# Patient Record
Sex: Female | Born: 1985 | Race: Black or African American | Hispanic: No | Marital: Single | Smoking: Former smoker
Health system: Southern US, Community
[De-identification: ages and names within clinical notes are randomized; demographics above are authoritative.]

## PROBLEM LIST (undated history)

## (undated) ENCOUNTER — Inpatient Hospital Stay: Payer: Self-pay

## (undated) DIAGNOSIS — D649 Anemia, unspecified: Secondary | ICD-10-CM

## (undated) DIAGNOSIS — O99210 Obesity complicating pregnancy, unspecified trimester: Secondary | ICD-10-CM

## (undated) HISTORY — PX: OVARIAN CYST REMOVAL: SHX89

## (undated) HISTORY — PX: CHOLECYSTECTOMY: SHX55

---

## 2007-04-03 ENCOUNTER — Emergency Department: Payer: Self-pay | Admitting: Emergency Medicine

## 2010-06-29 ENCOUNTER — Emergency Department: Payer: Self-pay | Admitting: Unknown Physician Specialty

## 2010-08-04 HISTORY — PX: OVARIAN CYST REMOVAL: SHX89

## 2011-08-01 ENCOUNTER — Emergency Department: Payer: Self-pay | Admitting: Emergency Medicine

## 2011-10-26 ENCOUNTER — Emergency Department: Payer: Self-pay | Admitting: Emergency Medicine

## 2011-10-26 LAB — URINALYSIS, COMPLETE
Bilirubin,UR: NEGATIVE
Glucose,UR: NEGATIVE mg/dL (ref 0–75)
Ketone: NEGATIVE
Squamous Epithelial: 1
WBC UR: 2 /HPF (ref 0–5)

## 2011-10-26 LAB — COMPREHENSIVE METABOLIC PANEL
Albumin: 4.1 g/dL (ref 3.4–5.0)
BUN: 12 mg/dL (ref 7–18)
Bilirubin,Total: 0.4 mg/dL (ref 0.2–1.0)
EGFR (African American): >60
Osmolality: 280 (ref 275–301)
Potassium: 4.2 mmol/L (ref 3.5–5.1)
Sodium: 141 mmol/L (ref 136–145)
Total Protein: 7.8 g/dL (ref 6.4–8.2)

## 2011-10-26 LAB — CBC
HCT: 40.6 % (ref 35.0–47.0)
MCHC: 33.1 g/dL (ref 32.0–36.0)
Platelet: 271 10*3/uL (ref 150–440)

## 2011-10-26 LAB — PREGNANCY, URINE: Pregnancy Test, Urine: NEGATIVE m[IU]/mL

## 2011-10-26 LAB — WET PREP, GENITAL

## 2012-07-18 ENCOUNTER — Emergency Department: Payer: Self-pay | Admitting: Internal Medicine

## 2012-07-18 LAB — URINALYSIS, COMPLETE
Bacteria: NONE SEEN
Bilirubin,UR: NEGATIVE
Blood: NEGATIVE
Glucose,UR: NEGATIVE mg/dL (ref 0–75)
Leukocyte Esterase: NEGATIVE
Nitrite: NEGATIVE
Protein: NEGATIVE
RBC,UR: NONE SEEN /HPF (ref 0–5)
Squamous Epithelial: 1
WBC UR: 1 /HPF (ref 0–5)

## 2012-07-18 LAB — COMPREHENSIVE METABOLIC PANEL
Alkaline Phosphatase: 87 U/L (ref 50–136)
Chloride: 107 mmol/L (ref 98–107)
Co2: 25 mmol/L (ref 21–32)
Creatinine: 0.97 mg/dL (ref 0.60–1.30)
EGFR (African American): >60
EGFR (Non-African Amer.): >60
Glucose: 86 mg/dL (ref 65–99)
Osmolality: 278 (ref 275–301)
SGPT (ALT): 22 U/L (ref 12–78)
Sodium: 139 mmol/L (ref 136–145)

## 2012-07-18 LAB — CBC
HGB: 12.7 g/dL (ref 12.0–16.0)
MCH: 28.4 pg (ref 26.0–34.0)
MCV: 84 fL (ref 80–100)
Platelet: 266 10*3/uL (ref 150–440)
RBC: 4.47 10*6/uL (ref 3.80–5.20)
RDW: 13.1 % (ref 11.5–14.5)

## 2012-07-18 LAB — WET PREP, GENITAL

## 2012-07-18 LAB — PREGNANCY, URINE: Pregnancy Test, Urine: NEGATIVE m[IU]/mL

## 2012-07-18 LAB — LIPASE, BLOOD: Lipase: 219 U/L (ref 73–393)

## 2013-03-15 ENCOUNTER — Emergency Department: Payer: Self-pay | Admitting: Emergency Medicine

## 2013-03-15 LAB — CBC
HCT: 40.8 % (ref 35.0–47.0)
HGB: 13.5 g/dL (ref 12.0–16.0)
MCH: 27.6 pg (ref 26.0–34.0)
MCHC: 33.2 g/dL (ref 32.0–36.0)
RBC: 4.91 10*6/uL (ref 3.80–5.20)

## 2013-03-15 LAB — URINALYSIS, COMPLETE
Glucose,UR: NEGATIVE mg/dL (ref 0–75)
Ketone: NEGATIVE
Ph: 6 (ref 4.5–8.0)
RBC,UR: 48 /HPF (ref 0–5)
Specific Gravity: 1.014 (ref 1.003–1.030)
Squamous Epithelial: 19
WBC UR: 18 /HPF (ref 0–5)

## 2013-03-15 LAB — COMPREHENSIVE METABOLIC PANEL
Anion Gap: 7 (ref 7–16)
Bilirubin,Total: 0.3 mg/dL (ref 0.2–1.0)
Calcium, Total: 9.2 mg/dL (ref 8.5–10.1)
Chloride: 110 mmol/L — ABNORMAL HIGH (ref 98–107)
Creatinine: 0.98 mg/dL (ref 0.60–1.30)
EGFR (African American): >60
Glucose: 92 mg/dL (ref 65–99)
Osmolality: 279 (ref 275–301)
Potassium: 4.1 mmol/L (ref 3.5–5.1)
SGOT(AST): 22 U/L (ref 15–37)
Sodium: 140 mmol/L (ref 136–145)

## 2013-03-15 LAB — GC/CHLAMYDIA PROBE AMP

## 2013-03-23 ENCOUNTER — Emergency Department: Payer: Self-pay | Admitting: Emergency Medicine

## 2013-10-05 ENCOUNTER — Emergency Department: Payer: Self-pay | Admitting: Emergency Medicine

## 2013-10-05 LAB — WET PREP, GENITAL

## 2013-10-05 LAB — URINALYSIS, COMPLETE
BLOOD: NEGATIVE
Bilirubin,UR: NEGATIVE
Glucose,UR: NEGATIVE mg/dL (ref 0–75)
Ketone: NEGATIVE
Nitrite: NEGATIVE
Ph: 6 (ref 4.5–8.0)
RBC,UR: 3 /HPF (ref 0–5)
SPECIFIC GRAVITY: 1.032 (ref 1.003–1.030)
Squamous Epithelial: 4
WBC UR: 1 /HPF (ref 0–5)

## 2013-10-05 LAB — GC/CHLAMYDIA PROBE AMP

## 2013-10-06 ENCOUNTER — Emergency Department: Payer: Self-pay | Admitting: Emergency Medicine

## 2013-10-06 LAB — COMPREHENSIVE METABOLIC PANEL
ALT: 25 U/L (ref 12–78)
Albumin: 4.1 g/dL (ref 3.4–5.0)
Alkaline Phosphatase: 78 U/L
Anion Gap: 4 — ABNORMAL LOW (ref 7–16)
BUN: 11 mg/dL (ref 7–18)
Bilirubin,Total: 0.2 mg/dL (ref 0.2–1.0)
CALCIUM: 8.7 mg/dL (ref 8.5–10.1)
CREATININE: 1.09 mg/dL (ref 0.60–1.30)
Chloride: 107 mmol/L (ref 98–107)
Co2: 27 mmol/L (ref 21–32)
EGFR (African American): >60
Glucose: 88 mg/dL (ref 65–99)
OSMOLALITY: 274 (ref 275–301)
POTASSIUM: 3.8 mmol/L (ref 3.5–5.1)
SGOT(AST): 27 U/L (ref 15–37)
SODIUM: 138 mmol/L (ref 136–145)
TOTAL PROTEIN: 8 g/dL (ref 6.4–8.2)

## 2013-10-06 LAB — CBC WITH DIFFERENTIAL/PLATELET
BASOS PCT: 0.8 %
Basophil #: 0 10*3/uL (ref 0.0–0.1)
EOS ABS: 0 10*3/uL (ref 0.0–0.7)
EOS PCT: 0 %
HCT: 42.2 % (ref 35.0–47.0)
HGB: 13.8 g/dL (ref 12.0–16.0)
LYMPHS ABS: 1.1 10*3/uL (ref 1.0–3.6)
Lymphocyte %: 23.8 %
MCH: 27.5 pg (ref 26.0–34.0)
MCHC: 32.8 g/dL (ref 32.0–36.0)
MCV: 84 fL (ref 80–100)
Monocyte #: 0.7 x10 3/mm (ref 0.2–0.9)
Monocyte %: 15.2 %
NEUTROS ABS: 2.9 10*3/uL (ref 1.4–6.5)
Neutrophil %: 60.2 %
Platelet: 224 10*3/uL (ref 150–440)
RBC: 5.03 10*6/uL (ref 3.80–5.20)
RDW: 13.4 % (ref 11.5–14.5)
WBC: 4.7 10*3/uL (ref 3.6–11.0)

## 2013-10-06 LAB — LIPASE, BLOOD: Lipase: 115 U/L (ref 73–393)

## 2014-01-01 ENCOUNTER — Emergency Department: Payer: Self-pay | Admitting: Emergency Medicine

## 2014-01-01 LAB — URINALYSIS, COMPLETE
Bacteria: NONE SEEN
Bilirubin,UR: NEGATIVE
Glucose,UR: NEGATIVE mg/dL (ref 0–75)
NITRITE: NEGATIVE
Ph: 7 (ref 4.5–8.0)
Protein: NEGATIVE
SPECIFIC GRAVITY: 1.014 (ref 1.003–1.030)
WBC UR: 13 /HPF (ref 0–5)

## 2014-01-01 LAB — D-DIMER(ARMC): D-Dimer: 285 ng/ml

## 2014-01-01 LAB — CBC
HCT: 40.3 % (ref 35.0–47.0)
HGB: 13.1 g/dL (ref 12.0–16.0)
MCH: 27.4 pg (ref 26.0–34.0)
MCHC: 32.6 g/dL (ref 32.0–36.0)
MCV: 84 fL (ref 80–100)
PLATELETS: 271 10*3/uL (ref 150–440)
RBC: 4.8 10*6/uL (ref 3.80–5.20)
RDW: 13.3 % (ref 11.5–14.5)
WBC: 10.5 10*3/uL (ref 3.6–11.0)

## 2014-01-01 LAB — BASIC METABOLIC PANEL
Anion Gap: 5 — ABNORMAL LOW (ref 7–16)
BUN: 11 mg/dL (ref 7–18)
CALCIUM: 9 mg/dL (ref 8.5–10.1)
CHLORIDE: 108 mmol/L — AB (ref 98–107)
Co2: 24 mmol/L (ref 21–32)
Creatinine: 1.12 mg/dL (ref 0.60–1.30)
GLUCOSE: 88 mg/dL (ref 65–99)
OSMOLALITY: 273 (ref 275–301)
Potassium: 3.9 mmol/L (ref 3.5–5.1)
Sodium: 137 mmol/L (ref 136–145)

## 2014-01-01 LAB — PREGNANCY, URINE: PREGNANCY TEST, URINE: NEGATIVE m[IU]/mL

## 2014-01-01 LAB — TROPONIN I

## 2014-01-10 ENCOUNTER — Emergency Department: Payer: Self-pay | Admitting: Emergency Medicine

## 2014-08-04 DIAGNOSIS — K829 Disease of gallbladder, unspecified: Secondary | ICD-10-CM

## 2014-08-04 HISTORY — DX: Disease of gallbladder, unspecified: K82.9

## 2014-10-22 ENCOUNTER — Emergency Department: Payer: Self-pay | Admitting: Internal Medicine

## 2014-11-07 ENCOUNTER — Emergency Department
Admit: 2014-11-07 | Disposition: A | Discharge status: Home / Self Care | Payer: Self-pay | Admitting: Emergency Medicine

## 2014-11-07 LAB — COMPREHENSIVE METABOLIC PANEL
ALBUMIN: 4.2 g/dL
AST: 22 U/L
Alkaline Phosphatase: 72 U/L
Anion Gap: 8 (ref 7–16)
BUN: 15 mg/dL
CHLORIDE: 102 mmol/L
CREATININE: 0.91 mg/dL
Calcium, Total: 9.3 mg/dL
Co2: 25 mmol/L
EGFR (Non-African Amer.): >60
GLUCOSE: 95 mg/dL
POTASSIUM: 3.9 mmol/L
SGPT (ALT): 20 U/L
SODIUM: 135 mmol/L
TOTAL PROTEIN: 7.8 g/dL

## 2014-11-07 LAB — CBC WITH DIFFERENTIAL/PLATELET
BASOS ABS: 0.1 10*3/uL (ref 0.0–0.1)
Basophil %: 0.7 %
EOS ABS: 0.1 10*3/uL (ref 0.0–0.7)
Eosinophil %: 0.4 %
HCT: 38.8 % (ref 35.0–47.0)
HGB: 12.5 g/dL (ref 12.0–16.0)
Lymphocyte #: 2.9 10*3/uL (ref 1.0–3.6)
Lymphocyte %: 21 %
MCH: 26.8 pg (ref 26.0–34.0)
MCHC: 32.1 g/dL (ref 32.0–36.0)
MCV: 84 fL (ref 80–100)
Monocyte #: 0.9 x10 3/mm (ref 0.2–0.9)
Monocyte %: 6.6 %
Neutrophil #: 9.9 10*3/uL — ABNORMAL HIGH (ref 1.4–6.5)
Neutrophil %: 71.3 %
Platelet: 298 10*3/uL (ref 150–440)
RBC: 4.65 10*6/uL (ref 3.80–5.20)
RDW: 13.7 % (ref 11.5–14.5)
WBC: 13.9 10*3/uL — ABNORMAL HIGH (ref 3.6–11.0)

## 2014-11-07 LAB — URINALYSIS, COMPLETE
BLOOD: NEGATIVE
Bilirubin,UR: NEGATIVE
Glucose,UR: NEGATIVE mg/dL (ref 0–75)
KETONE: NEGATIVE
Nitrite: NEGATIVE
PH: 6 (ref 4.5–8.0)
Protein: 30
RBC,UR: 7 /HPF (ref 0–5)
Specific Gravity: 1.031 (ref 1.003–1.030)
WBC UR: 21 /HPF (ref 0–5)

## 2014-11-07 LAB — HCG, QUANTITATIVE, PREGNANCY: Beta Hcg, Quant.: 75744 m[IU]/mL — ABNORMAL HIGH

## 2014-11-09 LAB — URINE CULTURE

## 2014-11-12 LAB — CBC WITH DIFFERENTIAL/PLATELET
BASOS PCT: 0.6 %
Basophil #: 0.1 10*3/uL (ref 0.0–0.1)
EOS PCT: 0.1 %
Eosinophil #: 0 10*3/uL (ref 0.0–0.7)
HCT: 40.7 % (ref 35.0–47.0)
HGB: 13.4 g/dL (ref 12.0–16.0)
LYMPHS ABS: 2.9 10*3/uL (ref 1.0–3.6)
Lymphocyte %: 19.1 %
MCH: 27.3 pg (ref 26.0–34.0)
MCHC: 33 g/dL (ref 32.0–36.0)
MCV: 83 fL (ref 80–100)
MONO ABS: 0.8 x10 3/mm (ref 0.2–0.9)
MONOS PCT: 5.1 %
Neutrophil #: 11.5 10*3/uL — ABNORMAL HIGH (ref 1.4–6.5)
Neutrophil %: 75.1 %
PLATELETS: 323 10*3/uL (ref 150–440)
RBC: 4.93 10*6/uL (ref 3.80–5.20)
RDW: 13.4 % (ref 11.5–14.5)
WBC: 15.4 10*3/uL — AB (ref 3.6–11.0)

## 2014-11-12 LAB — URINALYSIS, COMPLETE
BLOOD: NEGATIVE
Bacteria: NONE SEEN
Bilirubin,UR: NEGATIVE
Glucose,UR: NEGATIVE mg/dL (ref 0–75)
Nitrite: NEGATIVE
Ph: 6 (ref 4.5–8.0)
Protein: 100
Specific Gravity: 1.027 (ref 1.003–1.030)

## 2014-11-12 LAB — COMPREHENSIVE METABOLIC PANEL
ALT: 31 U/L
AST: 23 U/L
Albumin: 4.4 g/dL
Alkaline Phosphatase: 76 U/L
Anion Gap: 11 (ref 7–16)
BUN: 14 mg/dL
Bilirubin,Total: 0.3 mg/dL
Calcium, Total: 9.6 mg/dL
Chloride: 101 mmol/L
Co2: 25 mmol/L
Creatinine: 0.86 mg/dL
EGFR (African American): >60
Glucose: 103 mg/dL — ABNORMAL HIGH
Potassium: 3.6 mmol/L
Sodium: 137 mmol/L
Total Protein: 8.1 g/dL

## 2014-11-12 LAB — LIPASE, BLOOD: Lipase: 27 U/L

## 2014-11-12 LAB — HCG, QUANTITATIVE, PREGNANCY: BETA HCG, QUANT.: 105165 m[IU]/mL — AB

## 2014-11-13 ENCOUNTER — Observation Stay
Admit: 2014-11-13 | Disposition: A | Discharge status: Home / Self Care | Payer: Self-pay | Attending: Obstetrics and Gynecology | Admitting: Obstetrics and Gynecology

## 2014-11-13 LAB — WET PREP, GENITAL

## 2014-11-13 LAB — GC/CHLAMYDIA PROBE AMP

## 2014-11-14 LAB — URINE CULTURE

## 2014-11-16 LAB — DRUG SCREEN, URINE
Amphetamines, Ur Screen: NEGATIVE
BENZODIAZEPINE, UR SCRN: NEGATIVE
Barbiturates, Ur Screen: NEGATIVE
CANNABINOID 50 NG, UR ~~LOC~~: POSITIVE
COCAINE METABOLITE, UR ~~LOC~~: NEGATIVE
MDMA (ECSTASY) UR SCREEN: NEGATIVE
METHADONE, UR SCREEN: NEGATIVE
Opiate, Ur Screen: NEGATIVE
PHENCYCLIDINE (PCP) UR S: NEGATIVE
TRICYCLIC, UR SCREEN: NEGATIVE

## 2014-11-16 LAB — BASIC METABOLIC PANEL WITH GFR
Anion Gap: 6 — ABNORMAL LOW
BUN: 5 mg/dL — ABNORMAL LOW
Calcium, Total: 8.3 mg/dL — ABNORMAL LOW
Chloride: 106 mmol/L
Co2: 23 mmol/L
Creatinine: 0.66 mg/dL
EGFR (African American): >60
EGFR (Non-African Amer.): >60
Glucose: 102 mg/dL — ABNORMAL HIGH
Potassium: 3.7 mmol/L
Sodium: 135 mmol/L

## 2014-11-23 LAB — HM PAP SMEAR: HM Pap smear: NEGATIVE

## 2014-11-23 LAB — HM HIV SCREENING LAB: HM HIV Screening: NEGATIVE

## 2014-11-28 ENCOUNTER — Other Ambulatory Visit: Payer: Self-pay | Admitting: Physician Assistant

## 2014-11-28 DIAGNOSIS — Z369 Encounter for antenatal screening, unspecified: Secondary | ICD-10-CM

## 2014-12-07 ENCOUNTER — Ambulatory Visit (HOSPITAL_BASED_OUTPATIENT_CLINIC_OR_DEPARTMENT_OTHER)
Admission: RE | Admit: 2014-12-07 | Discharge: 2014-12-07 | Disposition: A | Discharge status: Home / Self Care | Payer: Medicaid Other | Source: Ambulatory Visit | Attending: Physician Assistant | Admitting: Physician Assistant

## 2014-12-07 ENCOUNTER — Other Ambulatory Visit: Payer: Self-pay | Admitting: Obstetrics and Gynecology

## 2014-12-07 ENCOUNTER — Ambulatory Visit
Admission: RE | Admit: 2014-12-07 | Discharge: 2014-12-07 | Disposition: A | Discharge status: Home / Self Care | Payer: Medicaid Other | Source: Ambulatory Visit | Attending: Obstetrics and Gynecology | Admitting: Obstetrics and Gynecology

## 2014-12-07 ENCOUNTER — Other Ambulatory Visit: Payer: Self-pay | Admitting: Physician Assistant

## 2014-12-07 VITALS — BP 129/78 | HR 65 | Temp 98.2°F | Ht 64.0 in | Wt 281.0 lb

## 2014-12-07 DIAGNOSIS — Z36 Encounter for antenatal screening of mother: Secondary | ICD-10-CM | POA: Diagnosis not present

## 2014-12-07 DIAGNOSIS — O99212 Obesity complicating pregnancy, second trimester: Secondary | ICD-10-CM | POA: Diagnosis present

## 2014-12-07 DIAGNOSIS — Z369 Encounter for antenatal screening, unspecified: Secondary | ICD-10-CM

## 2014-12-07 LAB — US OB COMP LESS 14 WKS

## 2014-12-07 NOTE — Progress Notes (Signed)
I met wit the pt and reviewed the genetic counselor recommendations  Pt underwent first tri screen  Gatha Mayer MD

## 2014-12-07 NOTE — Progress Notes (Addendum)
Referring physician:  [facility]  Length of Consultation: 40 minutes   Ms. Hanawalt  was [facility] to [facility] for genetic counseling to review prenatal screening and testing options.  This note summarizes the information we discussed.    First trimester screening, which can include nuchal translucency ultrasound screen and/or first trimester maternal serum marker screening.  The nuchal translucency has approximately an 80% detection rate for Down syndrome and can be positive for other chromosome abnormalities as well as congenital heart defects.  When combined with a maternal serum marker screening, the detection rate is up to 90% for Down syndrome and up to 97% for trisomy 18.     Maternal serum marker screening, a blood test that measures pregnancy proteins, can provide risk assessments for Down syndrome, trisomy 18, and open neural tube defects (spina bifida, anencephaly). Because it does not directly examine the fetus, it cannot positively diagnose or rule out these problems.  Targeted ultrasound uses high frequency sound waves to create an image of the developing fetus.  An ultrasound is often recommended as a routine means of evaluating the pregnancy.  It is also used to screen for fetal anatomy problems (for example, a heart defect) that might be suggestive of a chromosomal or other abnormality.   Should these screening tests indicate an increased concern, then the following diagnostic options would be offered:  The chorionic villus sampling procedure is available for first trimester chromosome analysis.  This involves the withdrawal of a small amount of chorionic villi (tissue from the developing placenta).  Risk of pregnancy loss is estimated to be approximately 1 in 200 to 1 in 100 (0.5 to 1%).  There is approximately a 1% (1 in 100) chance that the CVS chromosome results will be unclear.  Chorionic villi cannot be tested for neural tube defects.      Amniocentesis involves the removal of a small amount of amniotic fluid from the sac surrounding the fetus with the use of a thin needle inserted through the maternal abdomen and uterus.  Ultrasound guidance is used throughout the procedure.  Fetal cells from amniotic fluid are directly evaluated and > 99.5% of chromosome problems and > 98% of open neural tube defects can be detected. This procedure is generally performed after the 15th week of pregnancy.  The main risks to this procedure include complications leading to miscarriage in less than 1 in 200 cases (0.5%).  As another option for information if the pregnancy is suspected to be an an increased chance for certain chromosome conditions, we also reviewed the availability of cell free fetal DNA testing from maternal blood to determine whether or not the baby may have either Down syndrome, trisomy 1613, or trisomy 4518.  This test utilizes a maternal blood sample and DNA sequencing technology to isolate circulating cell free fetal DNA from maternal plasma.  The fetal DNA can then be analyzed for DNA sequences that are derived from the three most common chromosomes involved in aneuploidy, chromosomes 13, 18, and 21.  If the overall amount of DNA is greater than the expected level for any of these chromosomes, aneuploidy is suspected.  This testing is commercially available, and is able to provide another means of determining the chance for one of these common chromosome conditions, without requiring an invasive procedure and traditional karyotype analysis.  While this is new technology, the testing does show great promise, and we offered it as an option.  We discussed this option with her  in detail.  We explained that while we do not consider it a replacement for invasive testing and karyotype analysis, a negative result from this testing would be reassuring, though not a guarantee of a normal chromosome complement for the baby.  An abnormal result is  certainly suggestive of an abnormal chromosome complement, though we would still recommend CVS or amniocentesis to confirm any findings from this testing.  We obtained a detailed family history and pregnancy history.  The family history was reported to be unremarkable for birth defects, mental retardation, recurrent pregnancy loss or known chromosome abnormalities.  Ms. Charleston Ropesrollinger reported no complications in this pregnancy.  She did reported smoking marijuana and cigarettes prior to learning that she was pregnant.  As we discussed, smoking during pregnancy has been associated with low birth weight, premature delivery and pregnancy loss. The use of marijuana in pregnancy is also known to be associated with low birth weight and premature delivery.  We therefore suggested the patient avoid smoking both tobacco and marijuana during this time.  Review of labs from [facility] indicated normal sickle cell screening and a normal MCV.  After consideration of the options, Ms. Polimeni elected to proceed with first trimester screening.  An ultrasound was performed at the time of the visit.  The gestational age was consistent with  12 weeks.  Fetal anatomy could not be assessed due to early gestational age.  Please refer to the ultrasound report for details of that study.  Ms. Charleston Ropesrollinger was encouraged to call with questions or concerns.  We can be contacted at [phone].   LABS ORDERED:  First trimester screening  Agree  Jimmey RalphElizabeth Blanche Gallien MD

## 2014-12-07 NOTE — Addendum Note (Signed)
Encounter addended by: Jimmey RalphElizabeth Sabryn Preslar, MD on: 12/07/2014  5:44 PM<BR>     Documentation filed: Clinical Notes, Notes Section

## 2014-12-11 ENCOUNTER — Telehealth: Payer: Self-pay | Admitting: Obstetrics and Gynecology

## 2014-12-11 NOTE — Telephone Encounter (Signed)
  Ms. Melanie [name] elected to undergo First Trimester screening on 12/07/14.  To review, first trimester screening, includes nuchal translucency ultrasound screen and/or first trimester maternal serum marker screening.  The nuchal translucency has approximately an 80% detection rate for Down syndrome and can be positive for other chromosome abnormalities as well as heart defects.  When combined with a maternal serum marker screening, the detection rate is up to 90% for Down syndrome and up to 97% for trisomy 13 and 18.     The results of the First Trimester Nuchal Translucency and Biochemical Screening were within normal range.  The risk for Down syndrome is now estimated to be less than 1 in 10,000.  The risk for Trisomy 13/18 is also estimated to be less than 1 in 10,000.  Should more definitive information be desired, we would offer amniocentesis.  Because we do not yet know the effectiveness of combined first and second trimester screening, we do not recommend a maternal serum screen to assess the chance for chromosome conditions.  However, if screening for neural tube defects is desired, maternal serum screening for AFP only can be performed between 15 and [redacted] weeks gestation.

## 2014-12-12 NOTE — H&P (Signed)
L&D Evaluation:  History:  HPI 29 year old G2P0010 with unsure LMP sometime in February at 839w5d by ED ultrasound presenting to ER with nausea and emesis for the past week.  Reports complete po intolerance during that time.  No vaginal bleeding.  Has not established care to date, but has follow up at [facility] later this month.  Was seen last week in ED treated for UTI using macrobid.  Denies fevers, chills, dysuria, or back pain.   Presents with nausea/vomiting   Patient's Medical History morbid obesity   Patient's Surgical History Dermoid cyst removal 2014 [facility]   Medications Pre Natal Vitamins   Allergies NKDA   Social History none   Family History Non-Contributory   ROS:  ROS All systems were reviewed.  HEENT, CNS, GI, GU, Respiratory, CV, Renal and Musculoskeletal systems were found to be normal.   Exam:  Vital Signs T98.5, BP 132/78, HR 87, RR20,O2sat 99%RA   Urine Protein see UA   General actively dry heaving   Mental Status clear   Chest clear   Heart normal sinus rhythm   Abdomen NABS, obese, soft, non-distended, non-tender   Back no CVAT   Edema no edema   Skin dry, no lesions   Impression:  Impression 29 year old G2P0010 with hyperemesis, UTI, and trichomonas   Plan:  Comments 1) Hyperemesis - npo, scheduled reglan and prn promethazine - MVI bag once daily IV - 16550mL/hr D5 1/2NS  2) Trichomonas - IV flagyl as po intolerance  3) Possibly inadequately treated UTI - ceftriaxone 1g IV once  4) Disposition - pending improvement in nausea   Electronic Signatures: Lorrene ReidStaebler, Marton Malizia M (MD)  (Signed 11-Apr-16 07:55)  Authored: L&D Evaluation   Last Updated: 11-Apr-16 07:55 by Lorrene ReidStaebler, Philipp Callegari M (MD)

## 2014-12-15 ENCOUNTER — Emergency Department
Admission: EM | Admit: 2014-12-15 | Discharge: 2014-12-15 | Disposition: A | Discharge status: Home / Self Care | Payer: Medicaid Other | Attending: Emergency Medicine | Admitting: Emergency Medicine

## 2014-12-15 ENCOUNTER — Other Ambulatory Visit: Payer: Self-pay

## 2014-12-15 DIAGNOSIS — O9989 Other specified diseases and conditions complicating pregnancy, childbirth and the puerperium: Secondary | ICD-10-CM | POA: Diagnosis not present

## 2014-12-15 DIAGNOSIS — R1013 Epigastric pain: Secondary | ICD-10-CM | POA: Insufficient documentation

## 2014-12-15 DIAGNOSIS — O210 Mild hyperemesis gravidarum: Secondary | ICD-10-CM | POA: Diagnosis not present

## 2014-12-15 DIAGNOSIS — Z3A13 13 weeks gestation of pregnancy: Secondary | ICD-10-CM | POA: Diagnosis not present

## 2014-12-15 DIAGNOSIS — O99211 Obesity complicating pregnancy, first trimester: Secondary | ICD-10-CM | POA: Insufficient documentation

## 2014-12-15 DIAGNOSIS — Z79899 Other long term (current) drug therapy: Secondary | ICD-10-CM | POA: Insufficient documentation

## 2014-12-15 DIAGNOSIS — O99341 Other mental disorders complicating pregnancy, first trimester: Secondary | ICD-10-CM | POA: Insufficient documentation

## 2014-12-15 DIAGNOSIS — Z87891 Personal history of nicotine dependence: Secondary | ICD-10-CM | POA: Diagnosis not present

## 2014-12-15 DIAGNOSIS — F419 Anxiety disorder, unspecified: Secondary | ICD-10-CM | POA: Insufficient documentation

## 2014-12-15 DIAGNOSIS — R079 Chest pain, unspecified: Secondary | ICD-10-CM | POA: Diagnosis not present

## 2014-12-15 LAB — CBC
HEMATOCRIT: 37.2 % (ref 35.0–47.0)
HEMOGLOBIN: 12.2 g/dL (ref 12.0–16.0)
MCH: 27.2 pg (ref 26.0–34.0)
MCHC: 32.7 g/dL (ref 32.0–36.0)
MCV: 82.9 fL (ref 80.0–100.0)
Platelets: 288 10*3/uL (ref 150–440)
RBC: 4.49 MIL/uL (ref 3.80–5.20)
RDW: 13.7 % (ref 11.5–14.5)
WBC: 14.9 10*3/uL — AB (ref 3.6–11.0)

## 2014-12-15 LAB — BASIC METABOLIC PANEL
ANION GAP: 12 (ref 5–15)
BUN: 10 mg/dL (ref 6–20)
CALCIUM: 9 mg/dL (ref 8.9–10.3)
CHLORIDE: 103 mmol/L (ref 101–111)
CO2: 21 mmol/L — ABNORMAL LOW (ref 22–32)
Creatinine, Ser: 0.77 mg/dL (ref 0.44–1.00)
GFR calc Af Amer: >60 mL/min (ref >60)
GLUCOSE: 101 mg/dL — AB (ref 65–99)
POTASSIUM: 3.3 mmol/L — AB (ref 3.5–5.1)
Sodium: 136 mmol/L (ref 135–145)

## 2014-12-15 LAB — TROPONIN I: Troponin I: <0.03 ng/mL (ref <0.031)

## 2014-12-15 MED ORDER — ONDANSETRON HCL 4 MG/2ML IJ SOLN: 4.0000 mg | Freq: Once | INTRAMUSCULAR | Status: DC

## 2014-12-15 MED ORDER — SODIUM CHLORIDE 0.9 % IV BOLUS (SEPSIS)
1000.0000 mL | INTRAVENOUS | Status: AC
  Administered 2014-12-15: 1000 mL via INTRAVENOUS

## 2014-12-15 MED ORDER — ONDANSETRON 4 MG PO TBDP
4.0000 mg | ORAL_TABLET | Freq: Once | ORAL | Status: AC
  Administered 2014-12-15: 4 mg via ORAL

## 2014-12-15 MED ORDER — ONDANSETRON HCL 4 MG/2ML IJ SOLN
4.0000 mg | INTRAMUSCULAR | Status: AC
  Administered 2014-12-15: 4 mg via INTRAVENOUS

## 2014-12-15 MED ORDER — ONDANSETRON 4 MG PO TBDP
ORAL_TABLET | ORAL | Status: AC
  Administered 2014-12-15: 4 mg via ORAL
  Filled 2014-12-15: qty 1

## 2014-12-15 MED ORDER — ONDANSETRON HCL 4 MG/2ML IJ SOLN
INTRAMUSCULAR | Status: AC
  Administered 2014-12-15: 4 mg via INTRAVENOUS
  Filled 2014-12-15: qty 2

## 2014-12-15 MED ORDER — ONDANSETRON HCL 4 MG/2ML IJ SOLN
INTRAMUSCULAR | Status: AC
  Filled 2014-12-15: qty 2

## 2014-12-15 NOTE — ED Provider Notes (Signed)
[facility] Emergency Department Provider Note  ____________________________________________  Time seen: Approximately 7:51 PM  I have reviewed the triage vital signs and the nursing notes.   HISTORY  Chief Complaint Emesis During Pregnancy and Chest Pain    HPI Melanie [name] is a 29 y.o. female with a history of morbid obesity and hyperemesis gravidarum who presents with about 3-4 days of worsening vomiting and some discomfort in the middle of her chest when she vomits.  She states that she had bad morning sickness during the beginning of this pregnancy and that had gotten a little bit better, but now she vomits anytime she eats or drinks anything.  She states that she has had some blood in her vomit as well recently.  The burning chest pain that she reported seems to be primarily results of her vomiting, as it occurs when she is throwing up and for some period of time afterwards.  She describes her symptoms as severe.  [facility] a [facility].  However, she states that [facility] because she needs high risk care due to her weight.  History reviewed. No pertinent past medical history.  Patient Active Problem List   Diagnosis Date Noted  . First trimester screening 12/07/2014    History reviewed. No pertinent past surgical history.  Current Outpatient Rx  Name  Route  Sig  Dispense  Refill  . ondansetron (ZOFRAN-ODT) 4 MG disintegrating tablet   Oral   Take 4 mg by mouth every 8 (eight) hours as needed for nausea or vomiting.         . Prenatal Vit-Fe Fumarate-FA (PRENATAL MULTIVITAMIN) TABS tablet   Oral   Take 1 tablet by mouth daily at 12 noon.         . ondansetron (ZOFRAN-ODT) 4 MG disintegrating tablet   Oral   Take 4 mg by mouth every 8 (eight) hours as needed for nausea or vomiting.            Allergies Review of patient's allergies indicates no  known allergies.  History reviewed. No pertinent family history.  Social History History  Substance Use Topics  . Smoking status: Former Smoker    Quit date: 09/08/2014  . Smokeless tobacco: Not on file  . Alcohol Use: No    Review of Systems Constitutional: No fever/chills Eyes: No visual changes. ENT: No sore throat. Cardiovascular: Burning chest pain as described above. Respiratory: Denies shortness of breath. Gastrointestinal: Epigastric pain, burning.  Persistent nausea and vomiting for days.  No diarrhea.  No constipation. Genitourinary: Negative for dysuria. Musculoskeletal: Negative for back pain. Skin: Negative for rash. Neurological: Negative for headaches, focal weakness or numbness.  10-point ROS otherwise negative.  ____________________________________________   PHYSICAL EXAM:  VITAL SIGNS: ED Triage Vitals  Enc Vitals Group     BP 12/15/14 1559 113/56 mmHg     Pulse Rate 12/15/14 1559 69     Resp 12/15/14 1559 18     Temp 12/15/14 1559 98.6 F (37 C)     Temp Source 12/15/14 1559 Oral     SpO2 12/15/14 1559 100 %     Weight 12/15/14 1557 275 lb (124.739 kg)     Height 12/15/14 1557 5\' 4"  (1.626 m)     Head Cir --      Peak Flow --      Pain Score 12/15/14 1557 10  Pain Loc --      Pain Edu? --      Excl. in GC? --     Constitutional: Alert and oriented, appears uncomfortable and nontoxic.  Anxious. Eyes: Conjunctivae are normal. PERRL. EOMI. Head: Atraumatic. Nose: No congestion/rhinnorhea. Mouth/Throat: Dry mucous membranes.  Oropharynx non-erythematous. Neck: No stridor.   Cardiovascular: Normal rate, regular rhythm. Grossly normal heart sounds.  Good peripheral circulation. Respiratory: Normal respiratory effort.  No retractions. Lungs CTAB. Gastrointestinal: Obese, Soft and nontender. No distention. No abdominal bruits. No CVA tenderness. Musculoskeletal: No lower extremity tenderness nor edema.  No joint effusions. Neurologic:   Normal speech and language. No gross focal neurologic deficits are appreciated. Speech is normal. No gait instability. Skin:  Skin is warm, dry and intact. No rash noted. Psychiatric: Anxious, seems frustrated. Speech and behavior are normal.  ____________________________________________   LABS (all labs ordered are listed, but only abnormal results are displayed)  Labs Reviewed  BASIC METABOLIC PANEL - Abnormal; Notable for the following:    Potassium 3.3 (*)    CO2 21 (*)    Glucose, Bld 101 (*)    All other components within normal limits  CBC - Abnormal; Notable for the following:    WBC 14.9 (*)    All other components within normal limits  TROPONIN I  URINALYSIS COMPLETEWITH MICROSCOPIC ([facility])    ____________________________________________  EKG   Date: 12/15/2014  Rate: 80  Rhythm: normal sinus rhythm  QRS Axis: normal  Intervals: normal  ST/T Wave abnormalities: normal  Conduction Disutrbances: none  Narrative Interpretation: unremarkable   ____________________________________________  RADIOLOGY  No results found.  ____________________________________________   PROCEDURES  Procedure(s) performed: None  Critical Care performed: No  ____________________________________________   INITIAL IMPRESSION / ASSESSMENT AND PLAN / ED COURSE  Pertinent labs & imaging results that were available during my care of the patient were reviewed by me and considered in my medical decision making (see chart for details).  The patient has normal vital signs but states that she has been vomiting for several days.  Her potassium is slightly low, but otherwise she has no evidence of renal failure or acute dehydration.  However, she has been vomiting into an emesis bag in the emergency department and does appear dry.  We will place a peripheral IV and give her a liter bolus and some Zofran and reassess.  ----------------------------------------- 10:17 PM on  12/15/2014 -----------------------------------------  The patient is feeling better after liter of fluids and IV Zofran.  Through the course of discussion it turns out that she has not been taking her by mouth Zofran at home because she does not like the taste.  She states that the Zofran in the hospital taste better.  We had a discussion about taking her medicine even if she does not like the way it tastes.  She is trying a PO challenge now and then will go home afterwards.  She wants something to eat and drink and feels better at this time. ____________________________________________   FINAL CLINICAL IMPRESSION(S) / ED DIAGNOSES  Final diagnoses:  Mild hyperemesis gravidarum     Loleta Roseory Blondell Laperle, MD 12/15/14 2310

## 2014-12-15 NOTE — ED Notes (Signed)
Pt states that she is [redacted] weeks pregnant, has been experiencing nausea and vomiting since last night. Also CP to center of chest for 3 days. Pt dry heaving. Pt alert and oriented X4, active, cooperative. RR even and unlabored, color WNL.

## 2014-12-15 NOTE — Discharge Instructions (Signed)
As we discussed, you need to follow up with your OB/GYN.  Even though you do not enjoy the taste of the medication, it is important to take your prescribed nausea medicine as written to prevent your symptoms for getting worse.  Return to the emergency department if he develop new or worsening symptoms that concern you   Hyperemesis Gravidarum Hyperemesis gravidarum is a severe form of nausea and vomiting that happens during pregnancy. Hyperemesis is worse than morning sickness. It may cause you to have nausea or vomiting all day for many days. It may keep you from eating and drinking enough food and liquids. Hyperemesis usually occurs during the first half (the first 20 weeks) of pregnancy. It often goes away once a woman is in her second half of pregnancy. However, sometimes hyperemesis continues through an entire pregnancy.  CAUSES  The cause of this condition is not completely known but is thought to be related to changes in the body's hormones when pregnant. It could be from the high level of the pregnancy hormone or an increase in estrogen in the body.  SIGNS AND SYMPTOMS   Severe nausea and vomiting.  Nausea that does not go away.  Vomiting that does not allow you to keep any food down.  Weight loss and body fluid loss (dehydration).  Having no desire to eat or not liking food you have previously enjoyed. DIAGNOSIS  Your health care provider will do a physical exam and ask you about your symptoms. He or she may also order blood tests and urine tests to make sure something else is not causing the problem.  TREATMENT  You may only need medicine to control the problem. If medicines do not control the nausea and vomiting, you will be treated in the hospital to prevent dehydration, increased acid in the blood (acidosis), weight loss, and changes in the electrolytes in your body that may harm the unborn baby (fetus). You may need IV fluids.  HOME CARE INSTRUCTIONS   Only take over-the-counter  or prescription medicines as directed by your health care provider.  Try eating a couple of dry crackers or toast in the morning before getting out of bed.  Avoid foods and smells that upset your stomach.  Avoid fatty and spicy foods.  Eat 5-6 small meals a day.  Do not drink when eating meals. Drink between meals.  For snacks, eat high-protein foods, such as cheese.  Eat or suck on things that have ginger in them. Ginger helps nausea.  Avoid food preparation. The smell of food can spoil your appetite.  Avoid iron pills and iron in your multivitamins until after 3-4 months of being pregnant. However, consult with your health care provider before stopping any prescribed iron pills. SEEK MEDICAL CARE IF:   Your abdominal pain increases.  You have a severe headache.  You have vision problems.  You are losing weight. SEEK IMMEDIATE MEDICAL CARE IF:   You are unable to keep fluids down.  You vomit blood.  You have constant nausea and vomiting.  You have excessive weakness.  You have extreme thirst.  You have dizziness or fainting.  You have a fever or persistent symptoms for more than 2-3 days.  You have a fever and your symptoms suddenly get worse. MAKE SURE YOU:   Understand these instructions.  Will watch your condition.  Will get help right away if you are not doing well or get worse. Document Released: 07/21/2005 Document Revised: 05/11/2013 Document Reviewed: 03/02/2013 Exit[employer] Patient Information  2015 [employer]. This information is not intended to replace advice given to you by your health care provider. Make sure you discuss any questions you have with your health care provider. ° °

## 2014-12-15 NOTE — ED Notes (Signed)
Pt takes ODT Zofran at home; out of mediation. Last taken 1 week ago.

## 2014-12-16 MED ORDER — METOPROLOL TARTRATE 25 MG PO TABS
ORAL_TABLET | ORAL | Status: AC
  Filled 2014-12-16: qty 1

## 2015-01-03 ENCOUNTER — Emergency Department
Admission: EM | Admit: 2015-01-03 | Discharge: 2015-01-03 | Disposition: A | Discharge status: Home / Self Care | Payer: Medicaid Other | Attending: Emergency Medicine | Admitting: Emergency Medicine

## 2015-01-03 DIAGNOSIS — O210 Mild hyperemesis gravidarum: Secondary | ICD-10-CM | POA: Diagnosis not present

## 2015-01-03 DIAGNOSIS — Z3A16 16 weeks gestation of pregnancy: Secondary | ICD-10-CM | POA: Diagnosis not present

## 2015-01-03 DIAGNOSIS — Z87891 Personal history of nicotine dependence: Secondary | ICD-10-CM | POA: Diagnosis not present

## 2015-01-03 DIAGNOSIS — Z79899 Other long term (current) drug therapy: Secondary | ICD-10-CM | POA: Insufficient documentation

## 2015-01-03 LAB — URINALYSIS COMPLETE WITH MICROSCOPIC (ARMC ONLY)
Bilirubin Urine: NEGATIVE
Glucose, UA: NEGATIVE mg/dL
HGB URINE DIPSTICK: NEGATIVE
Leukocytes, UA: NEGATIVE
Nitrite: NEGATIVE
Protein, ur: 100 mg/dL — AB
Specific Gravity, Urine: 1.027 (ref 1.005–1.030)
pH: 6 (ref 5.0–8.0)

## 2015-01-03 LAB — BASIC METABOLIC PANEL
Anion gap: 7 (ref 5–15)
BUN: 9 mg/dL (ref 6–20)
CO2: 24 mmol/L (ref 22–32)
CREATININE: 0.79 mg/dL (ref 0.44–1.00)
Calcium: 9.1 mg/dL (ref 8.9–10.3)
Chloride: 105 mmol/L (ref 101–111)
GLUCOSE: 107 mg/dL — AB (ref 65–99)
Potassium: 3.9 mmol/L (ref 3.5–5.1)
SODIUM: 136 mmol/L (ref 135–145)

## 2015-01-03 LAB — HEPATIC FUNCTION PANEL
ALBUMIN: 3.5 g/dL (ref 3.5–5.0)
ALT: 17 U/L (ref 14–54)
AST: 17 U/L (ref 15–41)
Alkaline Phosphatase: 70 U/L (ref 38–126)
Bilirubin, Direct: <0.1 mg/dL — ABNORMAL LOW (ref 0.1–0.5)
Total Bilirubin: 0.2 mg/dL — ABNORMAL LOW (ref 0.3–1.2)
Total Protein: 7 g/dL (ref 6.5–8.1)

## 2015-01-03 LAB — CBC
HEMATOCRIT: 36.2 % (ref 35.0–47.0)
Hemoglobin: 11.7 g/dL — ABNORMAL LOW (ref 12.0–16.0)
MCH: 27.3 pg (ref 26.0–34.0)
MCHC: 32.4 g/dL (ref 32.0–36.0)
MCV: 84.2 fL (ref 80.0–100.0)
PLATELETS: 272 10*3/uL (ref 150–440)
RBC: 4.3 MIL/uL (ref 3.80–5.20)
RDW: 13.3 % (ref 11.5–14.5)
WBC: 12.8 10*3/uL — AB (ref 3.6–11.0)

## 2015-01-03 MED ORDER — ACETAMINOPHEN 325 MG PO TABS
ORAL_TABLET | ORAL | Status: AC
  Administered 2015-01-03: 650 mg via ORAL
  Filled 2015-01-03: qty 2

## 2015-01-03 MED ORDER — SODIUM CHLORIDE 0.9 % IV BOLUS (SEPSIS)
1000.0000 mL | Freq: Once | INTRAVENOUS | Status: AC
  Administered 2015-01-03: 1000 mL via INTRAVENOUS

## 2015-01-03 MED ORDER — FAMOTIDINE IN NACL 20-0.9 MG/50ML-% IV SOLN
20.0000 mg | Freq: Once | INTRAVENOUS | Status: AC
  Administered 2015-01-03: 20 mg via INTRAVENOUS

## 2015-01-03 MED ORDER — VITAMIN B-6 50 MG PO TABS: 75.0000 mg | ORAL_TABLET | Freq: Every day | ORAL | Status: DC

## 2015-01-03 MED ORDER — METOCLOPRAMIDE HCL 5 MG/ML IJ SOLN
INTRAMUSCULAR | Status: AC
  Administered 2015-01-03: 10 mg via INTRAVENOUS
  Filled 2015-01-03: qty 2

## 2015-01-03 MED ORDER — METOCLOPRAMIDE HCL 5 MG/ML IJ SOLN
10.0000 mg | Freq: Once | INTRAMUSCULAR | Status: AC
  Administered 2015-01-03: 10 mg via INTRAVENOUS
  Filled 2015-01-03: qty 2

## 2015-01-03 MED ORDER — METOCLOPRAMIDE HCL 10 MG PO TABS: 10.0000 mg | ORAL_TABLET | Freq: Three times a day (TID) | ORAL | Status: DC | PRN

## 2015-01-03 MED ORDER — FAMOTIDINE IN NACL 20-0.9 MG/50ML-% IV SOLN
INTRAVENOUS | Status: AC
Start: 2015-01-03 — End: 2015-01-03
  Administered 2015-01-03: 20 mg via INTRAVENOUS
  Filled 2015-01-03: qty 50

## 2015-01-03 MED ORDER — ACETAMINOPHEN 325 MG PO TABS
650.0000 mg | ORAL_TABLET | Freq: Once | ORAL | Status: AC
  Administered 2015-01-03: 650 mg via ORAL

## 2015-01-03 NOTE — ED Notes (Signed)
Fetal heart tones preformed by MD

## 2015-01-03 NOTE — Discharge Instructions (Signed)
Hyperemesis Gravidarum °Hyperemesis gravidarum is a severe form of nausea and vomiting that happens during pregnancy. Hyperemesis is worse than morning sickness. It may cause you to have nausea or vomiting all day for many days. It may keep you from eating and drinking enough food and liquids. Hyperemesis usually occurs during the first half (the first 20 weeks) of pregnancy. It often goes away once a woman is in her second half of pregnancy. However, sometimes hyperemesis continues through an entire pregnancy.  °CAUSES  °The cause of this condition is not completely known but is thought to be related to changes in the body's hormones when pregnant. It could be from the high level of the pregnancy hormone or an increase in estrogen in the body.  °SIGNS AND SYMPTOMS  °· Severe nausea and vomiting. °· Nausea that does not go away. °· Vomiting that does not allow you to keep any food down. °· Weight loss and body fluid loss (dehydration). °· Having no desire to eat or not liking food you have previously enjoyed. °DIAGNOSIS  °Your health care provider will do a physical exam and ask you about your symptoms. He or she may also order blood tests and urine tests to make sure something else is not causing the problem.  °TREATMENT  °You may only need medicine to control the problem. If medicines do not control the nausea and vomiting, you will be treated in the hospital to prevent dehydration, increased acid in the blood (acidosis), weight loss, and changes in the electrolytes in your body that may harm the unborn baby (fetus). You may need IV fluids.  °HOME CARE INSTRUCTIONS  °· Only take over-the-counter or prescription medicines as directed by your health care provider. °· Try eating a couple of dry crackers or toast in the morning before getting out of bed. °· Avoid foods and smells that upset your stomach. °· Avoid fatty and spicy foods. °· Eat 5-6 small meals a day. °· Do not drink when eating meals. Drink between  meals. °· For snacks, eat high-protein foods, such as cheese. °· Eat or suck on things that have ginger in them. Ginger helps nausea. °· Avoid food preparation. The smell of food can spoil your appetite. °· Avoid iron pills and iron in your multivitamins until after 3-4 months of being pregnant. However, consult with your health care provider before stopping any prescribed iron pills. °SEEK MEDICAL CARE IF:  °· Your abdominal pain increases. °· You have a severe headache. °· You have vision problems. °· You are losing weight. °SEEK IMMEDIATE MEDICAL CARE IF:  °· You are unable to keep fluids down. °· You vomit blood. °· You have constant nausea and vomiting. °· You have excessive weakness. °· You have extreme thirst. °· You have dizziness or fainting. °· You have a fever or persistent symptoms for more than 2-3 days. °· You have a fever and your symptoms suddenly get worse. °MAKE SURE YOU:  °· Understand these instructions. °· Will watch your condition. °· Will get help right away if you are not doing well or get worse. °Document Released: 07/21/2005 Document Revised: 05/11/2013 Document Reviewed: 03/02/2013 °[employer]® Patient Information ©2015 [employer]. This information is not intended to replace advice given to you by your health care provider. Make sure you discuss any questions you have with your health care provider. ° °

## 2015-01-03 NOTE — ED Provider Notes (Signed)
[facility] Emergency Department Provider Note  ____________________________________________  Time seen: Approximately 4 PM  I have reviewed the triage vital signs and the nursing notes.   HISTORY  Chief Complaint Nausea and Emesis    HPI Lorrin Bertino is a 29 y.o. female who is a G1 P0 who presents today for 2 days of nausea vomiting left upper quadrant pain and headache. Patient says she has taken 4 doses of Zofran at home without relief. Has been having clear to yellow vomitus multiple times without relief. Was admitted for hyperemesis gravidarum 1 month ago. Is currently under OB/GYN care at the health Department. No history of STDs. Dr. Tish MenBlaney any vaginal discharge or bleeding. Has had a previous ultrasound with an IUP. Left upper quadrant pain is moderate to severe and cramping. Headache is frontal and worsened with  vomiting.   History reviewed. No pertinent past medical history.  Patient Active Problem List   Diagnosis Date Noted  . First trimester screening 12/07/2014    Past Surgical History  Procedure Laterality Date  . Ovarian cyst removal      Current Outpatient Rx  Name  Route  Sig  Dispense  Refill  . ondansetron (ZOFRAN-ODT) 4 MG disintegrating tablet   Oral   Take 4 mg by mouth every 8 (eight) hours as needed for nausea or vomiting.          . ondansetron (ZOFRAN-ODT) 4 MG disintegrating tablet   Oral   Take 4 mg by mouth every 8 (eight) hours as needed for nausea or vomiting.         . Prenatal Vit-Fe Fumarate-FA (PRENATAL MULTIVITAMIN) TABS tablet   Oral   Take 1 tablet by mouth daily at 12 noon.           Allergies Review of patient's allergies indicates no known allergies.  No family history on file.  Social History History  Substance Use Topics  . Smoking status: Former Smoker    Quit date: 09/08/2014  . Smokeless tobacco: Never Used  . Alcohol Use: No    Review of Systems Constitutional: No  fever/chills Eyes: No visual changes. ENT: No sore throat. Cardiovascular: Denies chest pain. Respiratory: Denies shortness of breath. Gastrointestinal: Left upper quadrant abdominal pain .  No diarrhea.  No constipation. Genitourinary: Negative for dysuria. Musculoskeletal: Negative for back pain. Skin: Negative for rash. Neurological: Negative for headaches, focal weakness or numbness.  10-point ROS otherwise negative.  ____________________________________________   PHYSICAL EXAM:  VITAL SIGNS: ED Triage Vitals  Enc Vitals Group     BP 01/03/15 1351 119/65 mmHg     Pulse Rate 01/03/15 1351 78     Resp 01/03/15 1351 18     Temp 01/03/15 1351 98.1 F (36.7 C)     Temp Source 01/03/15 1351 Oral     SpO2 01/03/15 1351 100 %     Weight 01/03/15 1351 278 lb (126.1 kg)     Height 01/03/15 1351 5\' 4"  (1.626 m)     Head Cir --      Peak Flow --      Pain Score 01/03/15 1411 10     Pain Loc --      Pain Edu? --      Excl. in GC? --     Constitutional: Multiple episodes of vomiting while I'm in the room with the patient. Had one episode of copious yellow to orange vomitus. Patient became tearful after large vomitus episode.  Eyes: Conjunctivae are normal.  PERRL. EOMI. Head: Atraumatic. Nose: No congestion/rhinnorhea. Mouth/Throat: Mucous membranes are moist.  Oropharynx non-erythematous. Neck: No stridor.   Cardiovascular: Normal rate, regular rhythm. Grossly normal heart sounds.  Good peripheral circulation. Respiratory: Normal respiratory effort.  No retractions. Lungs CTAB. Gastrointestinal: Left upper quadrant tenderness to palpation. The belly is soft.. No distention. No abdominal bruits. No CVA tenderness. Musculoskeletal: No lower extremity tenderness nor edema.  No joint effusions. Neurologic:  Normal speech and language. No gross focal neurologic deficits are appreciated. Speech is normal. No gait instability. Skin:  Skin is warm, dry and intact. No rash  noted. Psychiatric: Mood and affect are normal. Speech and behavior are normal.  ____________________________________________   LABS (all labs ordered are listed, but only abnormal results are displayed)  Labs Reviewed  CBC - Abnormal; Notable for the following:    WBC 12.8 (*)    Hemoglobin 11.7 (*)    All other components within normal limits  BASIC METABOLIC PANEL - Abnormal; Notable for the following:    Glucose, Bld 107 (*)    All other components within normal limits  URINALYSIS COMPLETEWITH MICROSCOPIC ([facility] ONLY) - Abnormal; Notable for the following:    Color, Urine AMBER (*)    APPearance CLOUDY (*)    Ketones, ur 1+ (*)    Protein, ur 100 (*)    Bacteria, UA FEW (*)    Squamous Epithelial / LPF 6-30 (*)    All other components within normal limits  HEPATIC FUNCTION PANEL - Abnormal; Notable for the following:    Total Bilirubin 0.2 (*)    Bilirubin, Direct <0.1 (*)    All other components within normal limits   ____________________________________________  EKG   ____________________________________________  RADIOLOGY   ____________________________________________   PROCEDURES   ____________________________________________   INITIAL IMPRESSION / ASSESSMENT AND PLAN / ED COURSE  Pertinent labs & imaging results that were available during my care of the patient were reviewed by me and considered in my medical decision making (see chart for details).  ----------------------------------------- 5:26 PM on 01/03/2015 ----------------------------------------- Fetal heart tones taken with ultrasound at 158. Patient improved and not vomiting any longer after Reglan and fluids IV. Tolerated by mouth crackers and juice. We'll discharge with Reglan and vitamin B6. We'll discharge to home.  ____________________________________________   FINAL CLINICAL IMPRESSION(S) / ED DIAGNOSES  Acute hyperemesis gravidarum. Return visit.    Myrna Blazer,  MD 01/03/15 [phone]

## 2015-01-03 NOTE — ED Notes (Signed)
Pt states she is [redacted] weeks pregnant and having N/V for the past 2 days with HA..states she has been having issues with HA and nausea.the patient is Rx zofran 8mg  ODT and told to take tylenol for HA but has not taken any for her HA in the past 2 days

## 2015-01-09 ENCOUNTER — Emergency Department
Admission: EM | Admit: 2015-01-09 | Discharge: 2015-01-09 | Disposition: A | Discharge status: Home / Self Care | Payer: Medicaid Other | Attending: Emergency Medicine | Admitting: Emergency Medicine

## 2015-01-09 ENCOUNTER — Emergency Department: Payer: Medicaid Other

## 2015-01-09 ENCOUNTER — Encounter: Payer: Self-pay | Admitting: *Deleted

## 2015-01-09 DIAGNOSIS — R102 Pelvic and perineal pain: Secondary | ICD-10-CM | POA: Diagnosis not present

## 2015-01-09 DIAGNOSIS — Z3A17 17 weeks gestation of pregnancy: Secondary | ICD-10-CM | POA: Insufficient documentation

## 2015-01-09 DIAGNOSIS — Z79899 Other long term (current) drug therapy: Secondary | ICD-10-CM | POA: Insufficient documentation

## 2015-01-09 DIAGNOSIS — O26899 Other specified pregnancy related conditions, unspecified trimester: Secondary | ICD-10-CM

## 2015-01-09 DIAGNOSIS — O210 Mild hyperemesis gravidarum: Secondary | ICD-10-CM | POA: Diagnosis present

## 2015-01-09 DIAGNOSIS — Z87891 Personal history of nicotine dependence: Secondary | ICD-10-CM | POA: Diagnosis not present

## 2015-01-09 DIAGNOSIS — O219 Vomiting of pregnancy, unspecified: Secondary | ICD-10-CM

## 2015-01-09 DIAGNOSIS — O9989 Other specified diseases and conditions complicating pregnancy, childbirth and the puerperium: Secondary | ICD-10-CM | POA: Diagnosis not present

## 2015-01-09 LAB — CBC
HCT: 35.1 % (ref 35.0–47.0)
HEMOGLOBIN: 11.5 g/dL — AB (ref 12.0–16.0)
MCH: 27.4 pg (ref 26.0–34.0)
MCHC: 32.6 g/dL (ref 32.0–36.0)
MCV: 84.1 fL (ref 80.0–100.0)
PLATELETS: 269 10*3/uL (ref 150–440)
RBC: 4.18 MIL/uL (ref 3.80–5.20)
RDW: 13.5 % (ref 11.5–14.5)
WBC: 11.7 10*3/uL — ABNORMAL HIGH (ref 3.6–11.0)

## 2015-01-09 LAB — COMPREHENSIVE METABOLIC PANEL
ALBUMIN: 3.5 g/dL (ref 3.5–5.0)
ALK PHOS: 75 U/L (ref 38–126)
ALT: 19 U/L (ref 14–54)
ANION GAP: 10 (ref 5–15)
AST: 27 U/L (ref 15–41)
BUN: 7 mg/dL (ref 6–20)
CHLORIDE: 102 mmol/L (ref 101–111)
CO2: 22 mmol/L (ref 22–32)
Calcium: 9 mg/dL (ref 8.9–10.3)
Creatinine, Ser: 0.83 mg/dL (ref 0.44–1.00)
GFR calc Af Amer: >60 mL/min (ref >60)
GFR calc non Af Amer: >60 mL/min (ref >60)
Glucose, Bld: 143 mg/dL — ABNORMAL HIGH (ref 65–99)
Potassium: 3.4 mmol/L — ABNORMAL LOW (ref 3.5–5.1)
Sodium: 134 mmol/L — ABNORMAL LOW (ref 135–145)
Total Bilirubin: 0.3 mg/dL (ref 0.3–1.2)
Total Protein: 6.9 g/dL (ref 6.5–8.1)

## 2015-01-09 LAB — HCG, QUANTITATIVE, PREGNANCY: HCG, BETA CHAIN, QUANT, S: 20318 m[IU]/mL — AB (ref <5)

## 2015-01-09 LAB — LIPASE, BLOOD: Lipase: 26 U/L (ref 22–51)

## 2015-01-09 MED ORDER — SODIUM CHLORIDE 0.9 % IV BOLUS (SEPSIS)
1000.0000 mL | Freq: Once | INTRAVENOUS | Status: AC
  Administered 2015-01-09: 1000 mL via INTRAVENOUS

## 2015-01-09 MED ORDER — ONDANSETRON HCL 4 MG/2ML IJ SOLN
INTRAMUSCULAR | Status: AC
  Filled 2015-01-09: qty 2

## 2015-01-09 MED ORDER — ONDANSETRON 8 MG PO TBDP
8.0000 mg | ORAL_TABLET | Freq: Once | ORAL | Status: AC
  Administered 2015-01-09: 8 mg via ORAL

## 2015-01-09 MED ORDER — ONDANSETRON HCL 4 MG/2ML IJ SOLN
4.0000 mg | Freq: Once | INTRAMUSCULAR | Status: AC
  Administered 2015-01-09: 4 mg via INTRAVENOUS

## 2015-01-09 NOTE — ED Notes (Signed)
Pt is [redacted] weeks pregnant sent by the health department for vomiting and pelvic pain

## 2015-01-09 NOTE — ED Provider Notes (Signed)
[facility] Emergency Department Provider Note  Time seen: 11:31 AM  I have reviewed the triage vital signs and the nursing notes.   HISTORY  Chief Complaint Hyperemesis Gravidarum    HPI Melanie [name] is a 29 y.o. female who is approximately [redacted] weeks pregnant who presents for vomiting, and pelvic pain. According to the patient she was diagnosed with hyperemesis gravidarum, and has been on Zofran at home. Patient states her nausea and vomiting have worsened today and her Zofran is not working for her. She also has been describing intermittent pelvic pain for the past few days, which she feels are worsening. She went to the health Department and they referred her to the emergency department for further evaluation. Patient describes the pelvic pain is sharp, intermittent, lasting for minutes at a time. Moderate in intensity. No modifying factors identified.     History reviewed. No pertinent past medical history.  Patient Active Problem List   Diagnosis Date Noted  . First trimester screening 12/07/2014    Past Surgical History  Procedure Laterality Date  . Ovarian cyst removal      Current Outpatient Rx  Name  Route  Sig  Dispense  Refill  . metoCLOPramide (REGLAN) 10 MG tablet   Oral   Take 1 tablet (10 mg total) by mouth every 8 (eight) hours as needed for nausea or vomiting.   12 tablet   1   . ondansetron (ZOFRAN-ODT) 4 MG disintegrating tablet   Oral   Take 4 mg by mouth every 8 (eight) hours as needed for nausea or vomiting.          . Prenatal Vit-Fe Fumarate-FA (PRENATAL MULTIVITAMIN) TABS tablet   Oral   Take 1 tablet by mouth daily at 12 noon.         . pyridOXINE (VITAMIN B-6) 50 MG tablet   Oral   Take 1.5 tablets (75 mg total) by mouth daily.   45 tablet   0     Allergies Review of patient's allergies indicates no known allergies.  No family history on file.  Social History History  Substance Use Topics  .  Smoking status: Former Smoker    Quit date: 09/08/2014  . Smokeless tobacco: Never Used  . Alcohol Use: No    Review of Systems Constitutional: Negative for fever. Cardiovascular: Negative for chest pain. Respiratory: Negative for shortness of breath. Gastrointestinal: Positive for pelvic pain, negative for nausea, vomiting, diarrhea Genitourinary: Negative for dysuria. Negative for vaginal bleeding, discharge, fluid leakage. Musculoskeletal: Negative for back pain. 10-point ROS otherwise negative.  ____________________________________________   PHYSICAL EXAM:  VITAL SIGNS: ED Triage Vitals  Enc Vitals Group     BP 01/09/15 1021 94/70 mmHg     Pulse Rate 01/09/15 1021 82     Resp --      Temp 01/09/15 1021 98 F (36.7 C)     Temp Source 01/09/15 1021 Oral     SpO2 01/09/15 1021 98 %     Weight 01/09/15 1016 274 lb 7.6 oz (124.5 kg)     Height 01/09/15 1016  (1.626 m)     Head Cir --      Peak Flow --      Pain Score 01/09/15 1013 10     Pain Loc --      Pain Edu? --      Excl. in GC? --     Constitutional: Alert and oriented. Well appearing and in no distress. ENT  Head: Normocephalic   Mouth/Throat: Mucous membranes are moist. Cardiovascular: Normal rate, regular rhythm. No murmurs, rubs, or gallops. Respiratory: Normal respiratory effort without tachypnea nor retractions. Breath sounds are clear  Gastrointestinal: Soft and nontender. No distention.   Musculoskeletal: Nontender with normal range of motion in all extremities. Neurologic:  Normal speech and language. No gross focal neurologic deficits  Skin:  Skin is warm, dry and intact.  Psychiatric: Mood and affect are normal. Speech and behavior are normal.   ____________________________________________    RADIOLOGY  Ultrasound within normal limits, 150 bpm, 16 weeks 4 days.  ____________________________________________   INITIAL IMPRESSION / ASSESSMENT AND PLAN / ED COURSE  Pertinent  labs & imaging results that were available during my care of the patient were reviewed by me and considered in my medical decision making (see chart for details).  [redacted] weeks pregnant with nausea and vomiting, and intermittent lower pelvic pain. According to the patient she was sent by the health Department and states they could not find a heart rate. We will obtain labs, IV hydrate, treat with IV Zofran, and obtain an ultrasound to help further evaluate the patient's pelvic pain. Currently patient appears very well in no distress, calm, comfortable.   Ultrasound largely within normal limits, labs within normal limits, patient feeling better and asking for something to eat. We'll discharge the patient home at this time. She states she has plenty of Zofran at home. Follow up with her primary care doctor/OB/GYN. ____________________________________________   FINAL CLINICAL IMPRESSION(S) / ED DIAGNOSES  Pelvic pain Nausea and vomiting Pregnancy   Minna AntisKevin Koral Thaden, MD 01/09/15 1331

## 2015-01-09 NOTE — Discharge Instructions (Signed)
Pelvic Pain Female pelvic pain can be caused by many different things and start from a variety of places. Pelvic pain refers to pain that is located in the lower half of the abdomen and between your hips. The pain may occur over a short period of time (acute) or may be reoccurring (chronic). The cause of pelvic pain may be related to disorders affecting the female reproductive organs (gynecologic), but it may also be related to the bladder, kidney stones, an intestinal complication, or muscle or skeletal problems. Getting help right away for pelvic pain is important, especially if there has been severe, sharp, or a sudden onset of unusual pain. It is also important to get help right away because some types of pelvic pain can be life threatening.  CAUSES  Below are only some of the causes of pelvic pain. The causes of pelvic pain can be in one of several categories.   Gynecologic.  Pelvic inflammatory disease.  Sexually transmitted infection.  Ovarian cyst or a twisted ovarian ligament (ovarian torsion).  Uterine lining that grows outside the uterus (endometriosis).  Fibroids, cysts, or tumors.  Ovulation.  Pregnancy.  Pregnancy that occurs outside the uterus (ectopic pregnancy).  Miscarriage.  Labor.  Abruption of the placenta or ruptured uterus.  Infection.  Uterine infection (endometritis).  Bladder infection.  Diverticulitis.  Miscarriage related to a uterine infection (septic abortion).  Bladder.  Inflammation of the bladder (cystitis).  Kidney stone(s).  Gastrointestinal.  Constipation.  Diverticulitis.  Neurologic.  Trauma.  Feeling pelvic pain because of mental or emotional causes (psychosomatic).  Cancers of the bowel or pelvis. EVALUATION  Your caregiver will want to take a careful history of your concerns. This includes recent changes in your health, a careful gynecologic history of your periods (menses), and a sexual history. Obtaining your family  history and medical history is also important. Your caregiver may suggest a pelvic exam. A pelvic exam will help identify the location and severity of the pain. It also helps in the evaluation of which organ system may be involved. In order to identify the cause of the pelvic pain and be properly treated, your caregiver may order tests. These tests may include:   A pregnancy test.  Pelvic ultrasonography.  An X-ray exam of the abdomen.  A urinalysis or evaluation of vaginal discharge.  Blood tests. HOME CARE INSTRUCTIONS   Only take over-the-counter or prescription medicines for pain, discomfort, or fever as directed by your caregiver.   Rest as directed by your caregiver.   Eat a balanced diet.   Drink enough fluids to make your urine clear or pale yellow, or as directed.   Avoid sexual intercourse if it causes pain.   Apply warm or cold compresses to the lower abdomen depending on which one helps the pain.   Avoid stressful situations.   Keep a journal of your pelvic pain. Write down when it started, where the pain is located, and if there are things that seem to be associated with the pain, such as food or your menstrual cycle.  Follow up with your caregiver as directed.  SEEK MEDICAL CARE IF:  Your medicine does not help your pain.  You have abnormal vaginal discharge. SEEK IMMEDIATE MEDICAL CARE IF:   You have heavy bleeding from the vagina.   Your pelvic pain increases.   You feel light-headed or faint.   You have chills.   You have pain with urination or blood in your urine.   You have uncontrolled diarrhea   or vomiting.   You have a fever or persistent symptoms for more than 3 days.  You have a fever and your symptoms suddenly get worse.   You are being physically or sexually abused.  MAKE SURE YOU:  Understand these instructions.  Will watch your condition.  Will get help if you are not doing well or get worse. Document Released:  06/17/2004 Document Revised: 12/05/2013 Document Reviewed: 11/10/2011 ExitCare Patient Information 2015 [employer]. This information is not intended to replace advice given to you by your health care provider. Make sure you discuss any questions you have with your health care provider.  

## 2015-01-09 NOTE — ED Notes (Signed)
Patient is resting comfortably. 

## 2015-01-09 NOTE — ED Notes (Signed)
Pt reports being [redacted] weeks pregnant and has had persistant n/v since pregnancy started. Also c./o pelvic pain, stabbing in nature. Pt denies any vaginal bleeding at this time.

## 2015-01-09 NOTE — ED Notes (Signed)
Vital signs stable. 

## 2015-01-11 ENCOUNTER — Ambulatory Visit
Admission: RE | Admit: 2015-01-11 | Discharge: 2015-01-11 | Disposition: A | Discharge status: Home / Self Care | Payer: Medicaid Other | Source: Ambulatory Visit | Attending: Obstetrics and Gynecology | Admitting: Obstetrics and Gynecology

## 2015-01-11 DIAGNOSIS — Z3A19 19 weeks gestation of pregnancy: Secondary | ICD-10-CM | POA: Diagnosis not present

## 2015-01-11 DIAGNOSIS — O99212 Obesity complicating pregnancy, second trimester: Secondary | ICD-10-CM

## 2015-01-11 DIAGNOSIS — E669 Obesity, unspecified: Secondary | ICD-10-CM | POA: Insufficient documentation

## 2015-01-11 DIAGNOSIS — O99210 Obesity complicating pregnancy, unspecified trimester: Secondary | ICD-10-CM | POA: Insufficient documentation

## 2015-01-25 ENCOUNTER — Ambulatory Visit
Admission: RE | Admit: 2015-01-25 | Discharge: 2015-01-25 | Disposition: A | Discharge status: Home / Self Care | Payer: Medicaid Other | Source: Ambulatory Visit | Attending: Maternal & Fetal Medicine | Admitting: Maternal & Fetal Medicine

## 2015-01-25 DIAGNOSIS — O99211 Obesity complicating pregnancy, first trimester: Secondary | ICD-10-CM | POA: Diagnosis present

## 2015-01-25 DIAGNOSIS — Z3A08 8 weeks gestation of pregnancy: Secondary | ICD-10-CM | POA: Insufficient documentation

## 2015-01-25 DIAGNOSIS — Z6841 Body Mass Index (BMI) 40.0 and over, adult: Secondary | ICD-10-CM | POA: Diagnosis not present

## 2015-01-25 DIAGNOSIS — O99212 Obesity complicating pregnancy, second trimester: Secondary | ICD-10-CM

## 2015-01-25 HISTORY — DX: Obesity complicating pregnancy, unspecified trimester: O99.210

## 2015-01-25 LAB — US OB FOLLOW UP

## 2015-01-25 LAB — US OB DETAIL + 14 WK

## 2015-02-22 ENCOUNTER — Ambulatory Visit
Admission: RE | Admit: 2015-02-22 | Discharge: 2015-02-22 | Disposition: A | Discharge status: Home / Self Care | Payer: Medicaid Other | Source: Ambulatory Visit | Attending: Maternal & Fetal Medicine | Admitting: Maternal & Fetal Medicine

## 2015-02-22 ENCOUNTER — Telehealth: Payer: Self-pay

## 2015-02-22 VITALS — BP 135/67 | HR 75 | Temp 98.0°F | Resp 18 | Ht 64.8 in | Wt 288.6 lb

## 2015-02-22 DIAGNOSIS — Z3A23 23 weeks gestation of pregnancy: Secondary | ICD-10-CM | POA: Diagnosis not present

## 2015-02-22 DIAGNOSIS — D649 Anemia, unspecified: Secondary | ICD-10-CM

## 2015-02-22 DIAGNOSIS — O99210 Obesity complicating pregnancy, unspecified trimester: Secondary | ICD-10-CM | POA: Diagnosis not present

## 2015-02-22 DIAGNOSIS — E669 Obesity, unspecified: Secondary | ICD-10-CM

## 2015-02-22 DIAGNOSIS — O99212 Obesity complicating pregnancy, second trimester: Secondary | ICD-10-CM | POA: Insufficient documentation

## 2015-02-22 HISTORY — DX: Anemia, unspecified: D64.9

## 2015-02-22 LAB — TSH: TSH: 2.492 u[IU]/mL (ref 0.350–4.500)

## 2015-02-22 MED ORDER — FOLIC ACID 1 MG PO TABS
1.0000 mg | ORAL_TABLET | Freq: Every day | ORAL | Status: DC
Start: 2015-02-22 — End: 2018-04-22

## 2015-02-22 MED ORDER — ASPIRIN EC 81 MG PO TBEC: 81.0000 mg | DELAYED_RELEASE_TABLET | Freq: Every day | ORAL | Status: DC

## 2015-02-22 NOTE — Progress Notes (Addendum)
[facility] Consultation   Chief Complaint: "I have anemia"  HPI: Ms. Melanie [name] is a 29 y.o. G1P0 at [redacted]w[redacted]d by earliest u/s performed on 11/13/14 at 8 w 5d who presents in consultation from  [facility] with recent proteinuria (up to 2+) and Hb on 02/12/2015 of 10.7.   Gynecologic History:  Patient's last menstrual period was 09/04/2014.   Past Medical History: Patient  has a past medical history of Obesity affecting pregnancy and Anemia.   Past Surgical History: She  has past surgical history that includes Ovarian cyst removal.  A review of her [facility] chart through care everywhere reveals that in 10/2010 she had exploratory lab and removal of bilateral dermoids.  In 12/2011 she had a diagnostic laparoscopy for suspected ovarian cyst and D & C.   Medications:  PNV FESO4  Allergies: Patient has No Known Allergies.   Social History: Patient  reports that she quit smoking about 5 months ago. She has never used smokeless tobacco. She reports that she does not drink alcohol or use illicit drugs.   Family History:  Mother - alive and well Father - diabetes, HTN, arthritis Sister - obese Brothers - alive and well  Review of Systems   Nausea, vomits daily, occ headache, occ shortness of breath.  Otherwise, a full 12 point review of systems was negative.  Physical Exam: BP 135/67 mmHg  Pulse 75  Temp(Src) 98 F (36.7 C) (Oral)  Resp 18  Ht 5' 4.8" (1.646 m)  Wt 288 lb 9.6 oz (130.908 kg)  BMI 48.32 kg/m2  SpO2 97%  LMP 09/04/2014  FHTs 150s today   01/11/2015, 01/25/2015 - Anatomy scans - normal, no previa  12/05/2014 - 3 hr GTT normal 82/145/136/110  01/09/2015 - CMP was normal   02/11/2105 - Hb 10.7/Hct33.5/WBC 12.5/Plt 328;  Creat 0.69; AST 12 (normal); TIBC, serum iron, iron saturation and ferritin normal.  Reticulocytes 1.8%.    Asessement: 29 yo gravida 1 at [redacted]w[redacted]d gestation with: 1. 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 Borderline hemoglobin for pregnancy  with normal iron studies 3. Proteinuria only 208 mg in 24 hr collection    Recommendations: 1. 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  A prescription was called to her pharmacy.  Because the patient snores, we would recommend scheduling her for an evaluation for sleep apnea.   We would recommend baseline laboratory studies, in addition to what she has had, to include an EKG, a complete metabolic profile, a P/C ratio and a TSH.  The patient would be due for repeat glucose testing in the next month.  The patient was counseled today about limiting her weight gain this pregnancy to 10-15 lbs.  We would recommend low-dose aspirin 81 mg per day.  A prescription was called to her pharmacy. Third trimester:  We would recommend an anesthesia consult.   We would recommend fetal surveillance to include monthly ultrasounds starting at 28 weeks, weekly antepartum testing starting at [redacted] weeks gestation and delivery by [redacted] weeks gestation.  If she develops gestational diabetes, preeclampsia or other complication the intensity of surveillance would need to be increased. At delivery:  The patient states that she is being transferred to [facility]  They will have their own protocols.  Otherwise, we would recommend the following:  We would recommend pneumatic compression devices in labor and postpartum.  We would recommend consideration of 3 gm instead of  2 gm of cefazolin for prophylaxis at the time of a cesarean delivery.  At the time of a cesarean delivery, we would recommend application of a negative pressure dressing.   Postpartum:  We would recommend referral for weight loss management and consideration of bariatric surgery. 2. Borderline hemoglobin for pregnancy with normal iron studies  Continue FESO4   Continue PNV   Hemoglobin electrophoresis   B12 level   Folate level 3. Proteinuria with negative evaluation at [facility] --  Reassess as necessary    Total time spent with the patient was 40 minutes with greater than 50% spent in counseling and coordination of care.  We will be happy to be involved in the ongoing care of Ms. [name] in anyway her providers desire.  Argentina Ponder, MD [facility]

## 2015-02-22 NOTE — Telephone Encounter (Signed)
Prescriptions called in to [facility] in [place] for folic acid  po daily and asprin  po daily as prescribed by Dr. Lady Deutscher.

## 2015-02-22 NOTE — Addendum Note (Signed)
Encounter addended by: Lady Deutscher, MD on: 02/22/2015 12:56 PM<BR>     Documentation filed: Notes Section

## 2015-02-23 ENCOUNTER — Other Ambulatory Visit (HOSPITAL_COMMUNITY): Payer: Self-pay | Admitting: Respiratory Therapy

## 2015-03-09 ENCOUNTER — Other Ambulatory Visit (HOSPITAL_COMMUNITY): Payer: Self-pay | Admitting: Respiratory Therapy

## 2015-03-26 ENCOUNTER — Other Ambulatory Visit (HOSPITAL_COMMUNITY): Payer: Self-pay | Admitting: Respiratory Therapy

## 2015-04-02 ENCOUNTER — Other Ambulatory Visit: Payer: Self-pay

## 2015-04-02 DIAGNOSIS — O99210 Obesity complicating pregnancy, unspecified trimester: Secondary | ICD-10-CM

## 2015-04-05 ENCOUNTER — Inpatient Hospital Stay: Admission: RE | Admit: 2015-04-05 | Payer: Medicaid Other | Source: Ambulatory Visit

## 2015-04-13 ENCOUNTER — Emergency Department
Admission: EM | Admit: 2015-04-13 | Discharge: 2015-04-13 | Disposition: A | Discharge status: Home / Self Care | Payer: Medicaid Other | Attending: Emergency Medicine | Admitting: Emergency Medicine

## 2015-04-13 ENCOUNTER — Encounter: Payer: Self-pay | Admitting: *Deleted

## 2015-04-13 ENCOUNTER — Observation Stay
Admission: EM | Admit: 2015-04-13 | Discharge: 2015-04-14 | Disposition: A | Discharge status: Home / Self Care | Payer: Medicaid Other | Attending: Obstetrics & Gynecology | Admitting: Obstetrics & Gynecology

## 2015-04-13 DIAGNOSIS — Z3A30 30 weeks gestation of pregnancy: Secondary | ICD-10-CM | POA: Diagnosis not present

## 2015-04-13 DIAGNOSIS — Z87891 Personal history of nicotine dependence: Secondary | ICD-10-CM | POA: Insufficient documentation

## 2015-04-13 DIAGNOSIS — Z7982 Long term (current) use of aspirin: Secondary | ICD-10-CM | POA: Diagnosis not present

## 2015-04-13 DIAGNOSIS — O210 Mild hyperemesis gravidarum: Secondary | ICD-10-CM

## 2015-04-13 DIAGNOSIS — R102 Pelvic and perineal pain: Secondary | ICD-10-CM | POA: Insufficient documentation

## 2015-04-13 DIAGNOSIS — O9989 Other specified diseases and conditions complicating pregnancy, childbirth and the puerperium: Secondary | ICD-10-CM | POA: Diagnosis not present

## 2015-04-13 DIAGNOSIS — Z79899 Other long term (current) drug therapy: Secondary | ICD-10-CM | POA: Insufficient documentation

## 2015-04-13 DIAGNOSIS — O212 Late vomiting of pregnancy: Secondary | ICD-10-CM | POA: Insufficient documentation

## 2015-04-13 LAB — URINALYSIS COMPLETE WITH MICROSCOPIC (ARMC ONLY)
BILIRUBIN URINE: NEGATIVE
GLUCOSE, UA: NEGATIVE mg/dL
HGB URINE DIPSTICK: NEGATIVE
Ketones, ur: NEGATIVE mg/dL
Leukocytes, UA: NEGATIVE
Nitrite: NEGATIVE
Protein, ur: NEGATIVE mg/dL
Specific Gravity, Urine: 1.019 (ref 1.005–1.030)
pH: 6 (ref 5.0–8.0)

## 2015-04-13 LAB — COMPREHENSIVE METABOLIC PANEL
ALK PHOS: 102 U/L (ref 38–126)
ALT: 16 U/L (ref 14–54)
AST: 20 U/L (ref 15–41)
Albumin: 3.3 g/dL — ABNORMAL LOW (ref 3.5–5.0)
Anion gap: 9 (ref 5–15)
BILIRUBIN TOTAL: 0.4 mg/dL (ref 0.3–1.2)
BUN: 9 mg/dL (ref 6–20)
CALCIUM: 8.9 mg/dL (ref 8.9–10.3)
CO2: 22 mmol/L (ref 22–32)
CREATININE: 0.79 mg/dL (ref 0.44–1.00)
Chloride: 106 mmol/L (ref 101–111)
GFR calc non Af Amer: >60 mL/min (ref >60)
Glucose, Bld: 126 mg/dL — ABNORMAL HIGH (ref 65–99)
Potassium: 3.5 mmol/L (ref 3.5–5.1)
SODIUM: 137 mmol/L (ref 135–145)
Total Protein: 6.8 g/dL (ref 6.5–8.1)

## 2015-04-13 LAB — CBC
HCT: 33.6 % — ABNORMAL LOW (ref 35.0–47.0)
Hemoglobin: 10.8 g/dL — ABNORMAL LOW (ref 12.0–16.0)
MCH: 26.5 pg (ref 26.0–34.0)
MCHC: 32.1 g/dL (ref 32.0–36.0)
MCV: 82.6 fL (ref 80.0–100.0)
PLATELETS: 274 10*3/uL (ref 150–440)
RBC: 4.07 MIL/uL (ref 3.80–5.20)
RDW: 13.2 % (ref 11.5–14.5)
WBC: 12.5 10*3/uL — ABNORMAL HIGH (ref 3.6–11.0)

## 2015-04-13 LAB — LIPASE, BLOOD: Lipase: 25 U/L (ref 22–51)

## 2015-04-13 MED ORDER — ONDANSETRON HCL 4 MG/2ML IJ SOLN
INTRAMUSCULAR | Status: AC
  Administered 2015-04-13: 4 mg via INTRAVENOUS
  Filled 2015-04-13: qty 2

## 2015-04-13 MED ORDER — SODIUM CHLORIDE 0.9 % IV BOLUS (SEPSIS)
1000.0000 mL | Freq: Once | INTRAVENOUS | Status: AC
  Administered 2015-04-13: 1000 mL via INTRAVENOUS

## 2015-04-13 MED ORDER — ONDANSETRON HCL 4 MG/2ML IJ SOLN
4.0000 mg | Freq: Once | INTRAMUSCULAR | Status: AC
Start: 2015-04-13 — End: 2015-04-13
  Administered 2015-04-13: 4 mg via INTRAVENOUS

## 2015-04-13 NOTE — ED Notes (Addendum)
Pt is 30 weeks, 2 days pregnant. Pt reports vomiting since this morning, denies abdominal pain/vaginal bleeding/increased vaginal discharge. Pt states she has had problems with vomiting since the start of pregnancy and is seen by [facility] at [facility]. Has taken nausea meds, but not today but does not take them anymore because "they are not working."

## 2015-04-13 NOTE — Discharge Instructions (Signed)
You will be taken upstairs to L&D for monitoring. In addition to your vomiting, I am concerned about your blood pressure being high (preeclampsia) as well as the pressure you are feeling in your pelvis. The specialists will evaluate you for these symptoms upstairs. Return to the ER for worsening symptoms, persistent vomiting, difficulty breathing or other concerns.  Hyperemesis Gravidarum Hyperemesis gravidarum is a severe form of nausea and vomiting that happens during pregnancy. Hyperemesis is worse than morning sickness. It may cause you to have nausea or vomiting all day for many days. It may keep you from eating and drinking enough food and liquids. Hyperemesis usually occurs during the first half (the first 20 weeks) of pregnancy. It often goes away once a woman is in her second half of pregnancy. However, sometimes hyperemesis continues through an entire pregnancy.  CAUSES  The cause of this condition is not completely known but is thought to be related to changes in the body's hormones when pregnant. It could be from the high level of the pregnancy hormone or an increase in estrogen in the body.  SIGNS AND SYMPTOMS   Severe nausea and vomiting.  Nausea that does not go away.  Vomiting that does not allow you to keep any food down.  Weight loss and body fluid loss (dehydration).  Having no desire to eat or not liking food you have previously enjoyed. DIAGNOSIS  Your health care provider will do a physical exam and ask you about your symptoms. He or she may also order blood tests and urine tests to make sure something else is not causing the problem.  TREATMENT  You may only need medicine to control the problem. If medicines do not control the nausea and vomiting, you will be treated in the hospital to prevent dehydration, increased acid in the blood (acidosis), weight loss, and changes in the electrolytes in your body that may harm the unborn baby (fetus). You may need IV fluids.  HOME  CARE INSTRUCTIONS   Only take over-the-counter or prescription medicines as directed by your health care provider.  Try eating a couple of dry crackers or toast in the morning before getting out of bed.  Avoid foods and smells that upset your stomach.  Avoid fatty and spicy foods.  Eat 5-6 small meals a day.  Do not drink when eating meals. Drink between meals.  For snacks, eat high-protein foods, such as cheese.  Eat or suck on things that have ginger in them. Ginger helps nausea.  Avoid food preparation. The smell of food can spoil your appetite.  Avoid iron pills and iron in your multivitamins until after 3-4 months of being pregnant. However, consult with your health care provider before stopping any prescribed iron pills. SEEK MEDICAL CARE IF:   Your abdominal pain increases.  You have a severe headache.  You have vision problems.  You are losing weight. SEEK IMMEDIATE MEDICAL CARE IF:   You are unable to keep fluids down.  You vomit blood.  You have constant nausea and vomiting.  You have excessive weakness.  You have extreme thirst.  You have dizziness or fainting.  You have a fever or persistent symptoms for more than 2-3 days.  You have a fever and your symptoms suddenly get worse. MAKE SURE YOU:   Understand these instructions.  Will watch your condition.  Will get help right away if you are not doing well or get worse. Document Released: 07/21/2005 Document Revised: 05/11/2013 Document Reviewed: 03/02/2013 [employer] Patient Information  2015 [employer]. This information is not intended to replace advice given to you by your health care provider. Make sure you discuss any questions you have with your health care provider.  Eating Plan for Hyperemesis Gravidarum Severe cases of hyperemesis gravidarum can lead to dehydration and malnutrition. The hyperemesis eating plan is one way to lessen the symptoms of nausea and vomiting. It is often used with  prescribed medicines to control your symptoms.  WHAT CAN I DO TO RELIEVE MY SYMPTOMS? Listen to your body. Everyone is different and has different preferences. Find what works best for you. Some of the following things may help:  Eat and drink slowly.  Eat 5-6 small meals daily instead of 3 large meals.   Eat crackers before you get out of bed in the morning.   Starchy foods are usually well tolerated (such as cereal, toast, bread, potatoes, pasta, rice, and pretzels).   Ginger may help with nausea. Add  tsp ground ginger to hot tea or choose ginger tea.   Try drinking 100% fruit juice or an electrolyte drink.  Continue to take your prenatal vitamins as directed by your health care provider. If you are having trouble taking your prenatal vitamins, talk with your health care provider about different options.  Include at least 1 serving of protein with your meals and snacks (such as meats or poultry, beans, nuts, eggs, or yogurt). Try eating a protein-rich snack before bed (such as cheese and crackers or a half [place] or peanut butter sandwich). WHAT THINGS SHOULD I AVOID TO REDUCE MY SYMPTOMS? The following things may help reduce your symptoms:  Avoid foods with strong smells. Try eating meals in well-ventilated areas that are free of odors.  Avoid drinking water or other beverages with meals. Try not to drink anything less than 30 minutes before and after meals.  Avoid drinking more than 1 cup of fluid at a time.  Avoid fried or high-fat foods, such as butter and cream sauces.  Avoid spicy foods.  Avoid skipping meals the best you can. Nausea can be more intense on an empty stomach. If you cannot tolerate food at that time, do not force it. Try sucking on ice chips or other frozen items and make up the calories later.  Avoid lying down within 2 hours after eating. Document Released: 05/18/2007 Document Revised: 07/26/2013 Document Reviewed: 05/25/2013 [facility] Patient  Information 2015 [place], [place]. This information is not intended to replace advice given to you by your health care provider. Make sure you discuss any questions you have with your health care provider.

## 2015-04-13 NOTE — ED Notes (Signed)
Pt discharged from ED and to go to L&D for fetal monitoring and possible contractions.

## 2015-04-13 NOTE — ED Provider Notes (Signed)
[facility] Emergency Department Provider Note  ____________________________________________  Time seen: Approximately 11:12 PM  I have reviewed the triage vital signs and the nursing notes.   HISTORY  Chief Complaint Emesis During Pregnancy    HPI Melanie [name] is a 29 y.o. female who presents to the ED from home with a chief complain of nausea and vomiting. Patient is a G1 P0 approximately 30 weeks and 2 days pregnant followed by [facility]. Patient states she has been plagued with vomiting throughout her pregnancy. She is prescribed Zofran and Phenergan, which she is continued because she felt like they were not working for her. States vomiting is worse today. States she had a normal glucose tolerance test as well as a 24 hour urinalysis for protein.Complains of "a lot of pressure down there". Denies recent fever, chills, headache, upper abdominal pain, vision changes, chest pain, shortness of breath, diarrhea. Denies vaginal bleeding.   Past Medical History  Diagnosis Date  . Obesity affecting pregnancy   . Anemia     Patient Active Problem List   Diagnosis Date Noted  . Absolute anemia 02/22/2015  . Obesity affecting pregnancy 02/22/2015  . Obesity in pregnancy, antepartum 01/11/2015  . First trimester screening 12/07/2014    Past Surgical History  Procedure Laterality Date  . Ovarian cyst removal      Current Outpatient Rx  Name  Route  Sig  Dispense  Refill  . aspirin EC 81 MG tablet   Oral   Take 1 tablet (81 mg total) by mouth daily.   30 tablet   11   . Prenatal Vit-Fe Fumarate-FA (PRENATAL MULTIVITAMIN) TABS tablet   Oral   Take 1 tablet by mouth daily at 12 noon.         . folic acid (FOLVITE) 1 MG tablet   Oral   Take 1 tablet (1 mg total) by mouth daily.   30 tablet   11   . metoCLOPramide (REGLAN) 10 MG tablet   Oral   Take 1 tablet (10 mg total) by mouth every 8 (eight) hours as needed for nausea or  vomiting. Patient not taking: Reported on 01/09/2015   12 tablet   1   . pyridOXINE (VITAMIN B-6) 50 MG tablet   Oral   Take 1.5 tablets (75 mg total) by mouth daily.   45 tablet   0     Allergies Review of patient's allergies indicates no known allergies.  No family history on file.  Social History Social History  Substance Use Topics  . Smoking status: Former Smoker    Quit date: 09/08/2014  . Smokeless tobacco: Never Used  . Alcohol Use: No    Review of Systems Constitutional: No fever/chills Eyes: No visual changes. ENT: No sore throat. Cardiovascular: Denies chest pain. Respiratory: Denies shortness of breath. Gastrointestinal: No abdominal pain.  Positive for nausea and vomiting.  No diarrhea.  No constipation. Genitourinary: Positive for pelvic pressure. Negative for dysuria. Musculoskeletal: Negative for back pain. Skin: Negative for rash. Neurological: Negative for headaches, focal weakness or numbness.  10-point ROS otherwise negative.  ____________________________________________   PHYSICAL EXAM:  VITAL SIGNS: ED Triage Vitals  Enc Vitals Group     BP 04/13/15 2021 132/67 mmHg     Pulse Rate 04/13/15 2021 87     Resp 04/13/15 2021 18     Temp 04/13/15 2021 98.6 F (37 C)     Temp Source 04/13/15 2021 Oral     SpO2 04/13/15 2021  96 %     Weight 04/13/15 2021 296 lb 1.6 oz (134.31 kg)     Height 04/13/15 2021 5' 4.5" (1.638 m)     Head Cir --      Peak Flow --      Pain Score 04/13/15 2029 0     Pain Loc --      Pain Edu? --      Excl. in GC? --     Constitutional: Alert and oriented. Well appearing and in moderate acute distress, tearful. Eyes: Conjunctivae are normal. PERRL. EOMI. Head: Atraumatic. Nose: No congestion/rhinnorhea. Mouth/Throat: Mucous membranes are moist.  Oropharynx non-erythematous. Neck: No stridor.   Cardiovascular: Normal rate, regular rhythm. Grossly normal heart sounds.  Good peripheral circulation. Respiratory:  Normal respiratory effort.  No retractions. Lungs CTAB. Gastrointestinal: Gravid. Soft and tender to palpation lower abdomen without rebound or guarding. No distention. No abdominal bruits. No CVA tenderness. Genitourinary: Pelvic exam deferred in ED as patient going upstairs to L&D for monitoring. Musculoskeletal: No lower extremity tenderness nor edema.  No joint effusions. Neurologic:  Normal speech and language. No gross focal neurologic deficits are appreciated.  Skin:  Skin is warm, dry and intact. No rash noted. Psychiatric: Mood and affect are normal. Speech and behavior are normal.  ____________________________________________   LABS (all labs ordered are listed, but only abnormal results are displayed)  Labs Reviewed  COMPREHENSIVE METABOLIC PANEL - Abnormal; Notable for the following:    Glucose, Bld 126 (*)    Albumin 3.3 (*)    All other components within normal limits  CBC - Abnormal; Notable for the following:    WBC 12.5 (*)    Hemoglobin 10.8 (*)    HCT 33.6 (*)    All other components within normal limits  URINALYSIS COMPLETEWITH MICROSCOPIC ([facility] ONLY) - Abnormal; Notable for the following:    Color, Urine YELLOW (*)    APPearance HAZY (*)    Bacteria, UA MANY (*)    Squamous Epithelial / LPF 6-30 (*)    All other components within normal limits  LIPASE, BLOOD  URIC ACID  LACTATE DEHYDROGENASE   ____________________________________________  EKG  None ____________________________________________  RADIOLOGY  None ____________________________________________   PROCEDURES  Procedure(s) performed: None  Critical Care performed: No  ____________________________________________   INITIAL IMPRESSION / ASSESSMENT AND PLAN / ED COURSE  Pertinent labs & imaging results that were available during my care of the patient were reviewed by me and considered in my medical decision making (see chart for details).  29 year old female G1 P0 approximately 30  weeks and 2 days complaining of increased vomiting, pelvic pressure with elevated blood pressures in the ED. Uric acid and LDH added to lab work. She received 1 L IV fluids as well as IV Zofran while awaiting a room and currently nausea is improved. However, patient is tearful, holding her pelvis with waves of cramping pain which she describes as pressure. Given my concern for pregnancy-induced hypertension as well as preterm labor, patient will be transported upstairs to L&D for further evaluation. ____________________________________________   FINAL CLINICAL IMPRESSION(S) / ED DIAGNOSES  Final diagnoses:  Hyperemesis complicating pregnancy, antepartum      Irean Hong, MD 04/14/15 0007

## 2015-04-13 NOTE — ED Notes (Signed)
Pt actively vomiting clear yellow emesis in triage.

## 2015-04-14 LAB — LACTATE DEHYDROGENASE: LDH: 188 U/L (ref 98–192)

## 2015-04-14 LAB — URIC ACID: URIC ACID, SERUM: 3.7 mg/dL (ref 2.3–6.6)

## 2015-04-14 NOTE — H&P (Signed)
Obstetrics Admission History & Physical   CC: nausea.   HPI:  29 y.o. G1P0 @ [redacted]w[redacted]d (06/20/2015, by Ultrasound). Admitted on 04/13/2015:   Patient Active Problem List   Diagnosis Date Noted  . Labor and delivery indication for care or intervention 04/14/2015  . Absolute anemia 02/22/2015  . Obesity affecting pregnancy 02/22/2015  . Obesity in pregnancy, antepartum 01/11/2015  . First trimester screening 12/07/2014     Presents for nausea and fatigue.  Nause for about 4 week but wore today.  Meds have not helped her in past so stopped taking.  Worsning heartburn a well.  Prenatal care at: at [facility]  PMHx:  Past Medical History  Diagnosis Date  . Obesity affecting pregnancy   . Anemia    PSHx:  Past Surgical History  Procedure Laterality Date  . Ovarian cyst removal     Medications:  Prescriptions prior to admission  Medication Sig Dispense Refill Last Dose  . aspirin EC 81 MG tablet Take 1 tablet (81 mg total) by mouth daily. 30 tablet 11 04/14/2015 at Unknown time  . Prenatal Vit-Fe Fumarate-FA (PRENATAL MULTIVITAMIN) TABS tablet Take 1 tablet by mouth daily at 12 noon.   04/14/2015 at Unknown time  . folic acid (FOLVITE) 1 MG tablet Take 1 tablet (1 mg total) by mouth daily. (Patient not taking: Reported on 04/14/2015) 30 tablet 11 Not Taking at Unknown time  . metoCLOPramide (REGLAN) 10 MG tablet Take 1 tablet (10 mg total) by mouth every 8 (eight) hours as needed for nausea or vomiting. (Patient not taking: Reported on 01/09/2015) 12 tablet 1 Not Taking at Unknown time  . pyridOXINE (VITAMIN B-6) 50 MG tablet Take 1.5 tablets (75 mg total) by mouth daily. (Patient not taking: Reported on 04/14/2015) 45 tablet 0 Not Taking at Unknown time   Allergies: has No Known Allergies. OBHx:  OB History  Gravida Para Term Preterm AB SAB TAB Ectopic Multiple Living  1             # Outcome Date GA Lbr Len/2nd Weight Sex Delivery Anes PTL Lv  1 Current               ZOX:[id number] except as detailed in HPI. Soc Hx: Pregnancy welcomed  Objective:  There were no vitals filed for this visit. General: Well nourished, well developed female in no acute distress.  Skin: Warm and dry.  Abdomen: gravid, NT Neuro/Psych: Normal mood and affect.   Pelvic exam: is limited by body habitus EGBUS: within normal limits Cervix: closed, thick, high Uterus: No contractions observed for 30 minutes.  Adnexa: not evaluated  EFM:FHR: 140 bpm, variability: moderate,  accelerations:  Present,  decelerations:  Absent Toco: None  Assessment & Plan:   29 y.o. G1P0 @ [redacted]w[redacted]d, Admitted on 04/13/2015: GERD w Nausea     Zantac and fluids and rest, Discharge Home and Fetal Wellbeing Reassuring

## 2015-04-14 NOTE — OB Triage Note (Signed)
Patient arrived in ED at approximately 2000 with c/o of nausea and vomiting.  Patient treated in ED.  Patient subsequently complains of vaginal pressure.  Transferred to L&D for observation.

## 2015-04-14 NOTE — Discharge Instructions (Signed)
Gastroesophageal Reflux Disease, Adult Gastroesophageal reflux disease (GERD) happens when acid from your stomach goes into your food pipe (esophagus). The acid can cause a burning feeling in your chest. Over time, the acid can make small holes (ulcers) in your food pipe.  HOME CARE  Ask your doctor for advice about:  Losing weight.  Quitting smoking.  Alcohol use.  Avoid foods and drinks that make your problems worse. You may want to avoid:  Caffeine and alcohol.  Chocolate.  Mints.  Garlic and onions.  Spicy foods.  Citrus fruits, such as oranges, lemons, or limes.  Foods that contain tomato, such as sauce, chili, salsa, and pizza.  Fried and fatty foods.  Avoid lying down for 3 hours before you go to bed or before you take a nap.  Eat small meals often, instead of large meals.  Wear loose-fitting clothing. Do not wear anything tight around your waist.  Raise (elevate) the head of your bed 6 to 8 inches with wood blocks. Using extra pillows does not help.  Only take medicines as told by your doctor.  Do not take aspirin or ibuprofen. GET HELP RIGHT AWAY IF:   You have pain in your arms, neck, jaw, teeth, or back.  Your pain gets worse or changes.  You feel sick to your stomach (nauseous), throw up (vomit), or sweat (diaphoresis).  You feel short of breath, or you pass out (faint).  Your throw up is green, yellow, black, or looks like coffee grounds or blood.  Your poop (stool) is red, bloody, or black. MAKE SURE YOU:   Understand these instructions.  Will watch your condition.  Will get help right away if you are not doing well or get worse.  RECOMMEND ZANTAC twice daily (over the counter)

## 2015-05-03 ENCOUNTER — Encounter: Payer: Self-pay | Admitting: *Deleted

## 2015-05-03 ENCOUNTER — Observation Stay
Admission: EM | Admit: 2015-05-03 | Discharge: 2015-05-04 | Disposition: A | Discharge status: Home / Self Care | Payer: Medicaid Other | Attending: Obstetrics & Gynecology | Admitting: Obstetrics & Gynecology

## 2015-05-03 DIAGNOSIS — D649 Anemia, unspecified: Secondary | ICD-10-CM | POA: Diagnosis not present

## 2015-05-03 DIAGNOSIS — Z3A33 33 weeks gestation of pregnancy: Secondary | ICD-10-CM | POA: Insufficient documentation

## 2015-05-03 DIAGNOSIS — O26893 Other specified pregnancy related conditions, third trimester: Secondary | ICD-10-CM | POA: Diagnosis not present

## 2015-05-03 DIAGNOSIS — R197 Diarrhea, unspecified: Secondary | ICD-10-CM | POA: Insufficient documentation

## 2015-05-03 DIAGNOSIS — O212 Late vomiting of pregnancy: Principal | ICD-10-CM | POA: Insufficient documentation

## 2015-05-03 DIAGNOSIS — R112 Nausea with vomiting, unspecified: Secondary | ICD-10-CM | POA: Diagnosis present

## 2015-05-03 DIAGNOSIS — Z7982 Long term (current) use of aspirin: Secondary | ICD-10-CM | POA: Diagnosis not present

## 2015-05-03 DIAGNOSIS — Z79899 Other long term (current) drug therapy: Secondary | ICD-10-CM | POA: Diagnosis not present

## 2015-05-03 DIAGNOSIS — O99213 Obesity complicating pregnancy, third trimester: Secondary | ICD-10-CM | POA: Insufficient documentation

## 2015-05-03 LAB — URINALYSIS COMPLETE WITH MICROSCOPIC (ARMC ONLY)
Bilirubin Urine: NEGATIVE
GLUCOSE, UA: NEGATIVE mg/dL
HGB URINE DIPSTICK: NEGATIVE
LEUKOCYTES UA: NEGATIVE
NITRITE: NEGATIVE
PROTEIN: 30 mg/dL — AB
SPECIFIC GRAVITY, URINE: 1.018 (ref 1.005–1.030)
pH: 6 (ref 5.0–8.0)

## 2015-05-03 LAB — COMPREHENSIVE METABOLIC PANEL
ALBUMIN: 3.1 g/dL — AB (ref 3.5–5.0)
ALT: 17 U/L (ref 14–54)
ANION GAP: 5 (ref 5–15)
AST: 26 U/L (ref 15–41)
Alkaline Phosphatase: 123 U/L (ref 38–126)
BILIRUBIN TOTAL: 0.5 mg/dL (ref 0.3–1.2)
BUN: 7 mg/dL (ref 6–20)
CHLORIDE: 105 mmol/L (ref 101–111)
CO2: 25 mmol/L (ref 22–32)
Calcium: 8.9 mg/dL (ref 8.9–10.3)
Creatinine, Ser: 0.6 mg/dL (ref 0.44–1.00)
GFR calc Af Amer: >60 mL/min (ref >60)
GFR calc non Af Amer: >60 mL/min (ref >60)
GLUCOSE: 85 mg/dL (ref 65–99)
POTASSIUM: 3.8 mmol/L (ref 3.5–5.1)
SODIUM: 135 mmol/L (ref 135–145)
TOTAL PROTEIN: 6.6 g/dL (ref 6.5–8.1)

## 2015-05-03 LAB — MAGNESIUM: Magnesium: 1.8 mg/dL (ref 1.7–2.4)

## 2015-05-03 MED ORDER — LACTATED RINGERS IV SOLN
INTRAVENOUS | Status: DC
  Administered 2015-05-03: 20:00:00 via INTRAVENOUS

## 2015-05-03 MED ORDER — ONDANSETRON 4 MG PO TBDP: 4.0000 mg | ORAL_TABLET | Freq: Three times a day (TID) | ORAL | Status: DC | PRN

## 2015-05-03 MED ORDER — ONDANSETRON HCL 4 MG/2ML IJ SOLN
4.0000 mg | Freq: Four times a day (QID) | INTRAMUSCULAR | Status: DC | PRN
  Administered 2015-05-03: 4 mg via INTRAVENOUS
  Filled 2015-05-03: qty 2

## 2015-05-03 NOTE — Discharge Instructions (Signed)
Hyperemesis Gravidarum °Hyperemesis gravidarum is a severe form of nausea and vomiting that happens during pregnancy. Hyperemesis is worse than morning sickness. It may cause you to have nausea or vomiting all day for many days. It may keep you from eating and drinking enough food and liquids. Hyperemesis usually occurs during the first half (the first 20 weeks) of pregnancy. It often goes away once a woman is in her second half of pregnancy. However, sometimes hyperemesis continues through an entire pregnancy.  °CAUSES  °The cause of this condition is not completely known but is thought to be related to changes in the body's hormones when pregnant. It could be from the high level of the pregnancy hormone or an increase in estrogen in the body.  °SIGNS AND SYMPTOMS  °· Severe nausea and vomiting. °· Nausea that does not go away. °· Vomiting that does not allow you to keep any food down. °· Weight loss and body fluid loss (dehydration). °· Having no desire to eat or not liking food you have previously enjoyed. °DIAGNOSIS  °Your health care provider will do a physical exam and ask you about your symptoms. He or she may also order blood tests and urine tests to make sure something else is not causing the problem.  °TREATMENT  °You may only need medicine to control the problem. If medicines do not control the nausea and vomiting, you will be treated in the hospital to prevent dehydration, increased acid in the blood (acidosis), weight loss, and changes in the electrolytes in your body that may harm the unborn baby (fetus). You may need IV fluids.  °HOME CARE INSTRUCTIONS  °· Only take over-the-counter or prescription medicines as directed by your health care provider. °· Try eating a couple of dry crackers or toast in the morning before getting out of bed. °· Avoid foods and smells that upset your stomach. °· Avoid fatty and spicy foods. °· Eat 5-6 small meals a day. °· Do not drink when eating meals. Drink between  meals. °· For snacks, eat high-protein foods, such as cheese. °· Eat or suck on things that have ginger in them. Ginger helps nausea. °· Avoid food preparation. The smell of food can spoil your appetite. °· Avoid iron pills and iron in your multivitamins until after 3-4 months of being pregnant. However, consult with your health care provider before stopping any prescribed iron pills. °SEEK MEDICAL CARE IF:  °· Your abdominal pain increases. °· You have a severe headache. °· You have vision problems. °· You are losing weight. °SEEK IMMEDIATE MEDICAL CARE IF:  °· You are unable to keep fluids down. °· You vomit blood. °· You have constant nausea and vomiting. °· You have excessive weakness. °· You have extreme thirst. °· You have dizziness or fainting. °· You have a fever or persistent symptoms for more than 2-3 days. °· You have a fever and your symptoms suddenly get worse. °MAKE SURE YOU:  °· Understand these instructions. °· Will watch your condition. °· Will get help right away if you are not doing well or get worse. °Document Released: 07/21/2005 Document Revised: 05/11/2013 Document Reviewed: 03/02/2013 °[employer]® Patient Information ©2015 [employer]. This information is not intended to replace advice given to you by your health care provider. Make sure you discuss any questions you have with your health care provider. ° °

## 2015-05-03 NOTE — Discharge Summary (Signed)
Obstetrics Triage Visit   CC: nausea/vomiting.   HPI:  29 y.o. G2P0010 @ [redacted]w[redacted]d (06/20/2015, by Ultrasound). Seen in triage on 05/03/2015:  Patient Active Problem List   Diagnosis Date Noted  . Labor and delivery indication for care or intervention 04/14/2015  . Absolute anemia 02/22/2015  . Obesity affecting pregnancy 02/22/2015  . Obesity in pregnancy, antepartum 01/11/2015  . First trimester screening 12/07/2014     Presents for nausea and vomiting.Prenatal care at: at [facility] due to obesity.  During this visit patient stated she developed diarrhea.  PMHx:  Past Medical History  Diagnosis Date  . Obesity affecting pregnancy   . Anemia    PSHx:  Past Surgical History  Procedure Laterality Date  . Ovarian cyst removal     Medications:  Prescriptions prior to admission  Medication Sig Dispense Refill Last Dose  . aspirin EC 81 MG tablet Take 1 tablet (81 mg total) by mouth daily. 30 tablet 11 04/14/2015 at Unknown time  . Prenatal Vit-Fe Fumarate-FA (PRENATAL MULTIVITAMIN) TABS tablet Take 1 tablet by mouth daily at 12 noon.   04/14/2015 at Unknown time  . folic acid (FOLVITE) 1 MG tablet Take 1 tablet (1 mg total) by mouth daily. (Patient not taking: Reported on 04/14/2015) 30 tablet 11 Not Taking at Unknown time  . metoCLOPramide (REGLAN) 10 MG tablet Take 1 tablet (10 mg total) by mouth every 8 (eight) hours as needed for nausea or vomiting. (Patient not taking: Reported on 01/09/2015) 12 tablet 1 Not Taking at Unknown time  . pyridOXINE (VITAMIN B-6) 50 MG tablet Take 1.5 tablets (75 mg total) by mouth daily. (Patient not taking: Reported on 04/14/2015) 45 tablet 0 Not Taking at Unknown time   JXB:JYNWGNFA/OZHYQMVHQION except as detailed in HPI. Soc Hx: Pregnancy welcomed  Objective:  BP 122/60 mmHg  Pulse 92  Temp(Src) 99.2 F (37.3 C) (Oral)  Resp 20  LMP 09/04/2014  General: Well  nourished, well developed female in no acute distress.  Skin: Warm and dry.  Abdomen: gravid, NT Neuro/Psych: Normal mood and affect.  Pelvic exam: deferred  EFM:FHR: 140 bpm, mod +accelsl no decels Toco: None  Results for orders placed or performed during the hospital encounter of 05/03/15 (from the past 24 hour(s))  Comprehensive metabolic panel     Status: Abnormal   Collection Time: 05/03/15  7:58 PM  Result Value Ref Range   Sodium 135 135 - 145 mmol/L   Potassium 3.8 3.5 - 5.1 mmol/L   Chloride 105 101 - 111 mmol/L   CO2 25 22 - 32 mmol/L   Glucose, Bld 85 65 - 99 mg/dL   BUN 7 6 - 20 mg/dL   Creatinine, Ser 6.29 0.44 - 1.00 mg/dL   Calcium 8.9 8.9 - 52.8 mg/dL   Total Protein 6.6 6.5 - 8.1 g/dL   Albumin 3.1 (L) 3.5 - 5.0 g/dL   AST 26 15 - 41 U/L   ALT 17 14 - 54 U/L   Alkaline Phosphatase 123 38 - 126 U/L   Total Bilirubin 0.5 0.3 - 1.2 mg/dL   GFR calc non Af Amer >60 >60 mL/min   GFR calc Af Amer >60 >60 mL/min   Anion gap 5 5 - 15  Magnesium     Status: None   Collection Time: 05/03/15  7:58 PM  Result Value Ref Range   Magnesium 1.8 1.7 - 2.4 mg/dL  Urinalysis complete, with microscopic ([facility] only)     Status: Abnormal   Collection  Time: 05/03/15  7:58 PM  Result Value Ref Range   Color, Urine YELLOW (A) YELLOW   APPearance HAZY (A) CLEAR   Glucose, UA NEGATIVE NEGATIVE mg/dL   Bilirubin Urine NEGATIVE NEGATIVE   Ketones, ur 2+ (A) NEGATIVE mg/dL   Specific Gravity, Urine 1.018 1.005 - 1.030   Hgb urine dipstick NEGATIVE NEGATIVE   pH 6.0 5.0 - 8.0   Protein, ur 30 (A) NEGATIVE mg/dL   Nitrite NEGATIVE NEGATIVE   Leukocytes, UA NEGATIVE NEGATIVE   RBC / HPF 0-5 0 - 5 RBC/hpf   WBC, UA 0-5 0 - 5 WBC/hpf   Bacteria, UA RARE (A) NONE SEEN   Squamous Epithelial / LPF 6-30 (A) NONE SEEN   Mucous PRESENT    Ca Oxalate Crys, UA PRESENT     Assessment & Plan:   29 y.o. G1P0 @ 33.1 with nausea, vomiting, and now diarrhea    1. Likely  gastroenteritis, BRAT diet and fluids recommended.  No electrolyte abnormalities of concern. 2. Has appointment Monday, keep this appt. 3. rx Zofran given after improvement with IV admin 4. D/c home.       ----- Ranae Plumber, MD Attending Obstetrician and Gynecologist [facility]

## 2015-05-03 NOTE — OB Triage Note (Signed)
Reports vomiting X 15 in the past 24 hours. Denies diarrhea. Abdominal cramping. Elaina Hoops

## 2015-05-04 DIAGNOSIS — O212 Late vomiting of pregnancy: Secondary | ICD-10-CM | POA: Diagnosis not present

## 2015-08-05 HISTORY — PX: CHOLECYSTECTOMY: SHX55

## 2015-11-30 ENCOUNTER — Encounter (HOSPITAL_COMMUNITY): Payer: Self-pay | Admitting: *Deleted

## 2016-02-07 ENCOUNTER — Ambulatory Visit
Admission: EM | Admit: 2016-02-07 | Discharge: 2016-02-07 | Disposition: A | Discharge status: Home / Self Care | Payer: Medicaid Other | Attending: Family Medicine | Admitting: Family Medicine

## 2016-02-07 ENCOUNTER — Encounter: Payer: Self-pay | Admitting: *Deleted

## 2016-02-07 DIAGNOSIS — F17210 Nicotine dependence, cigarettes, uncomplicated: Secondary | ICD-10-CM | POA: Insufficient documentation

## 2016-02-07 DIAGNOSIS — R112 Nausea with vomiting, unspecified: Secondary | ICD-10-CM | POA: Diagnosis not present

## 2016-02-07 LAB — URINALYSIS COMPLETE WITH MICROSCOPIC (ARMC ONLY)
Glucose, UA: NEGATIVE mg/dL
HGB URINE DIPSTICK: NEGATIVE
NITRITE: NEGATIVE
PH: 7 (ref 5.0–8.0)
RBC / HPF: NONE SEEN RBC/hpf (ref 0–5)
SPECIFIC GRAVITY, URINE: 1.025 (ref 1.005–1.030)

## 2016-02-07 LAB — PREGNANCY, URINE: PREG TEST UR: NEGATIVE

## 2016-02-07 MED ORDER — ONDANSETRON 8 MG PO TBDP
8.0000 mg | ORAL_TABLET | Freq: Once | ORAL | Status: AC
  Administered 2016-02-07: 8 mg via ORAL

## 2016-02-07 MED ORDER — ONDANSETRON HCL 4 MG/2ML IJ SOLN
8.0000 mg | Freq: Once | INTRAMUSCULAR | Status: DC
Start: 2016-02-07 — End: 2016-02-07

## 2016-02-07 MED ORDER — ONDANSETRON 8 MG PO TBDP: 8.0000 mg | ORAL_TABLET | Freq: Three times a day (TID) | ORAL | Status: DC | PRN

## 2016-02-07 NOTE — Discharge Instructions (Signed)
Nausea and Vomiting  Nausea means you feel sick to your stomach. Throwing up (vomiting) is a reflex where stomach contents come out of your mouth.  HOME CARE   · Take medicine as told by your doctor.  · Do not force yourself to eat. However, you do need to drink fluids.  · If you feel like eating, eat a normal diet as told by your doctor.    Eat rice, wheat, potatoes, bread, lean meats, yogurt, fruits, and vegetables.    Avoid high-fat foods.  · Drink enough fluids to keep your pee (urine) clear or pale yellow.  · Ask your doctor how to replace body fluid losses (rehydrate). Signs of body fluid loss (dehydration) include:    Feeling very thirsty.    Dry lips and mouth.    Feeling dizzy.    Dark pee.    Peeing less than normal.    Feeling confused.    Fast breathing or heart rate.  GET HELP RIGHT AWAY IF:   · You have blood in your throw up.  · You have black or bloody poop (stool).  · You have a bad headache or stiff neck.  · You feel confused.  · You have bad belly (abdominal) pain.  · You have chest pain or trouble breathing.  · You do not pee at least once every 8 hours.  · You have cold, clammy skin.  · You keep throwing up after 24 to 48 hours.  · You have a fever.  MAKE SURE YOU:   · Understand these instructions.  · Will watch your condition.  · Will get help right away if you are not doing well or get worse.     This information is not intended to replace advice given to you by your health care provider. Make sure you discuss any questions you have with your health care provider.     Document Released: 01/07/2008 Document Revised: 10/13/2011 Document Reviewed: 12/20/2010  Elsevier Interactive Patient Education ©2016 [employer]

## 2016-02-07 NOTE — ED Notes (Signed)
Started feeling symptoms this morning after breakfast. Nausea and vomiting occurred at work. Umbilical tenderness verbally reported.

## 2016-02-07 NOTE — ED Provider Notes (Signed)
CSN: [id number]     Arrival date & time 02/07/16  1307 History   First MD Initiated Contact with Patient 02/07/16 1419    Nurses notes were reviewed. Chief Complaint  Patient presents with  . Nausea  . Emesis   Patient is 30 year old obese black female who works at [employer]. She states that because she works that she does not keep them often but did have a SECB and started throwing up round 10:30 this morning. She states she's been up 3 times. She is able to keep down some water now. She denies any significant abdominal pain other than the lower abdomen. She does not think she is pregnant since she had. On June 15 but did have hyperemesis during her last pregnancy. She is a gravida 4 para 1 miscarriage 3. She is on Ortho-Novum birth control pills at this time  She's had gallbladder removed ovarian cyst removed and she has a history of anemia dermoid cyst and obesity. Her father has hypertension diabetes. She is a current smoker. No known drug allergies.   (Consider location/radiation/quality/duration/timing/severity/associated sxs/prior Treatment) Patient is a 30 y.o. female presenting with vomiting. The history is provided by the patient. No language interpreter was used.  Emesis Severity:  Moderate Duration:  4 hours Timing:  Intermittent Quality:  Stomach contents Progression:  Unchanged Chronicity:  New Context: not post-tussive and not self-induced   Relieved by:  Nothing Ineffective treatments:  None tried Associated symptoms: no abdominal pain and no diarrhea   Associated symptoms comment:  She does reports having some frequency Risk factors: no alcohol use, no diabetes, not pregnant now, no sick contacts, no suspect food intake and no travel to endemic areas     Past Medical History  Diagnosis Date  . Obesity affecting pregnancy     Dermoid Cyst  . Anemia    Past Surgical History  Procedure Laterality Date  . Ovarian cyst removal    . Cholecystectomy     Family  History  Problem Relation Age of Onset  . Hypertension Father   . Diabetes Father    Social History  Substance Use Topics  . Smoking status: Current Every Day Smoker    Types: Cigarettes    Last Attempt to Quit: 09/08/2014  . Smokeless tobacco: Never Used  . Alcohol Use: No   OB History    Gravida Para Term Preterm AB TAB SAB Ectopic Multiple Living   2 0 0 0 1 1  0 0 0      Obstetric Comments   [facility] to [facility]" [facility] - weight related. [place], [name]      Review of Systems  Gastrointestinal: Positive for vomiting. Negative for abdominal pain and diarrhea.  All other systems reviewed and are negative.   Allergies  Review of patient's allergies indicates no known allergies.  Home Medications   Prior to Admission medications   Medication Sig Start Date End Date Taking? Authorizing Provider  aspirin EC 81 MG tablet Take 1 tablet (81 mg total) by mouth daily. 02/22/15   Lady DeutscherAndra James, MD  ferrous fumarate (HEMOCYTE - 106 MG FE) 325 (106 FE) MG TABS tablet Take 1 tablet by mouth daily.    Historical Provider, MD  folic acid (FOLVITE) 1 MG tablet Take 1 tablet (1 mg total) by mouth daily. 02/22/15   Lady DeutscherAndra James, MD  metoCLOPramide (REGLAN) 10 MG tablet Take 1 tablet (10 mg total) by mouth every 8 (eight) hours as needed for  nausea or vomiting. Patient not taking: Reported on 01/09/2015 01/03/15 01/03/16  Myrna Blazer, MD  ondansetron (ZOFRAN ODT) 4 MG disintegrating tablet Take 1 tablet (4 mg total) by mouth every 8 (eight) hours as needed for nausea or vomiting. 05/03/15   Elenora Fender Ward, MD  ondansetron (ZOFRAN ODT) 8 MG disintegrating tablet Take 1 tablet (8 mg total) by mouth every 8 (eight) hours as needed for nausea or vomiting. 02/07/16   Hassan Rowan, MD  Prenatal Vit-Fe Fumarate-FA (PRENATAL MULTIVITAMIN) TABS tablet Take 1 tablet by mouth daily at 12 noon.    Historical Provider, MD  pyridOXINE (VITAMIN B-6) 50 MG tablet Take 1.5 tablets (75 mg  total) by mouth daily. 01/03/15   Myrna Blazer, MD   Meds Ordered and Administered this Visit   Medications  ondansetron (ZOFRAN-ODT) disintegrating tablet 8 mg (8 mg Oral Given 02/07/16 1431)    BP 126/74 mmHg  Pulse 67  Temp(Src) 98.2 F (36.8 C) (Oral)  Resp 16  Ht  (1.626 m)  Wt 282 lb (127.914 kg)  BMI 48.38 kg/m2  SpO2 100%  LMP 01/17/2016  Breastfeeding? No No data found.   Physical Exam  Constitutional: She is oriented to person, place, and time. She appears well-developed and well-nourished.  HENT:  Head: Normocephalic and atraumatic.  Eyes: Pupils are equal, round, and reactive to light.  Neck: Normal range of motion. Neck supple.  Cardiovascular: Normal rate, regular rhythm and normal heart sounds.   Pulmonary/Chest: Effort normal and breath sounds normal. She has no wheezes.  Abdominal: Soft. She exhibits no distension. There is no hepatosplenomegaly. There is tenderness in the suprapubic area. There is no CVA tenderness. Hernia confirmed negative in the right inguinal area and confirmed negative in the left inguinal area.    Musculoskeletal: Normal range of motion.  Neurological: She is alert and oriented to person, place, and time.  Skin: Skin is warm and dry.  Vitals reviewed.   ED Course  Procedures (including critical care time)  Labs Review Labs Reviewed  URINALYSIS COMPLETEWITH MICROSCOPIC ([facility] ONLY) - Abnormal; Notable for the following:    APPearance HAZY (*)    Bilirubin Urine 1+ (*)    Ketones, ur TRACE (*)    Protein, ur TRACE (*)    Leukocytes, UA TRACE (*)    Bacteria, UA FEW (*)    Squamous Epithelial / LPF 6-30 (*)    All other components within normal limits  URINE CULTURE  PREGNANCY, URINE    Imaging Review No results found.   Visual Acuity Review  Right Eye Distance:   Left Eye Distance:   Bilateral Distance:    Right Eye Near:   Left Eye Near:    Bilateral Near:      Results for orders placed or  performed during the hospital encounter of 02/07/16  Urinalysis complete, with microscopic  Result Value Ref Range   Color, Urine YELLOW YELLOW   APPearance HAZY (A) CLEAR   Glucose, UA NEGATIVE NEGATIVE mg/dL   Bilirubin Urine 1+ (A) NEGATIVE   Ketones, ur TRACE (A) NEGATIVE mg/dL   Specific Gravity, Urine 1.025 1.005 - 1.030   Hgb urine dipstick NEGATIVE NEGATIVE   pH 7.0 5.0 - 8.0   Protein, ur TRACE (A) NEGATIVE mg/dL   Nitrite NEGATIVE NEGATIVE   Leukocytes, UA TRACE (A) NEGATIVE   RBC / HPF NONE SEEN 0 - 5 RBC/hpf   WBC, UA 0-5 0 - 5 WBC/hpf   Bacteria, UA FEW (A)  NONE SEEN   Squamous Epithelial / LPF 6-30 (A) NONE SEEN  Pregnancy, urine  Result Value Ref Range   Preg Test, Ur NEGATIVE NEGATIVE    MDM   1. Non-intractable vomiting with nausea, vomiting of unspecified type     Patient improved after receiving the oral Zofran. Explained to her that pregnancy test was negative urine analysis was unremarkable for acute infection culture was ordered. Will give a work note for today and tomorrow but she still having nausea and vomiting she'll need follow-up PCP or go to the ED of her choice.     Hassan RowanEugene Giana Castner, MD 02/07/16 [phone]

## 2016-02-09 LAB — URINE CULTURE: Special Requests: NORMAL

## 2016-03-15 ENCOUNTER — Ambulatory Visit
Admission: EM | Admit: 2016-03-15 | Discharge: 2016-03-15 | Disposition: A | Discharge status: Home / Self Care | Payer: Medicaid Other | Attending: Emergency Medicine | Admitting: Emergency Medicine

## 2016-03-15 DIAGNOSIS — R112 Nausea with vomiting, unspecified: Secondary | ICD-10-CM | POA: Diagnosis not present

## 2016-03-15 DIAGNOSIS — F17210 Nicotine dependence, cigarettes, uncomplicated: Secondary | ICD-10-CM | POA: Insufficient documentation

## 2016-03-15 LAB — URINALYSIS COMPLETE WITH MICROSCOPIC (ARMC ONLY)
Bilirubin Urine: NEGATIVE
GLUCOSE, UA: NEGATIVE mg/dL
Hgb urine dipstick: NEGATIVE
Ketones, ur: NEGATIVE mg/dL
Nitrite: NEGATIVE
PROTEIN: 30 mg/dL — AB
RBC / HPF: NONE SEEN RBC/hpf (ref 0–5)
SPECIFIC GRAVITY, URINE: 1.02 (ref 1.005–1.030)
pH: 7.5 (ref 5.0–8.0)

## 2016-03-15 LAB — PREGNANCY, URINE: PREG TEST UR: NEGATIVE

## 2016-03-15 MED ORDER — ONDANSETRON 4 MG PO TBDP: 4.0000 mg | ORAL_TABLET | Freq: Three times a day (TID) | ORAL | 0 refills | Status: DC | PRN

## 2016-03-15 MED ORDER — ONDANSETRON 8 MG PO TBDP
8.0000 mg | ORAL_TABLET | Freq: Once | ORAL | Status: AC
  Administered 2016-03-15: 8 mg via ORAL

## 2016-03-15 NOTE — ED Provider Notes (Signed)
[facility] ____________________________________________  Time seen: Approximately 3:42 PM  I have reviewed the triage vital signs and the nursing notes.   HISTORY  Chief Complaint Emesis   HPI Melanie [name] is a 30 y.o. female presents with a complaint of nausea and vomiting. Patient reports 6 total vomiting episodes. Patient reports one episode started around 6:30 PM last night. Patient reports yesterday afternoon around 3 PM she ate a piece of grilled chicken that she states did not taste completely normal. Patient reports that she then ate take-out [place] of chicken lo[place]mein. Patient states approximately 30 minutes after eating the chicken lo-mein she began to have vomiting episodes. Patient reports vomiting 4 times last night and twice this morning. Patient reports last vomiting episode was approximate 7:30 AM this morning. Patient reports this afternoon she was able to drink a bottle of water just prior to arrival and has since tolerated that well. Patient states overall she is feeling better.  Patient reports that she was having some crampy abdominal discomfort that intensified just prior to vomiting and states that vomiting relieved the crampy discomfort. Patient reports no pain at this time. Patient reports she does have a very mild cramping sensation to lower abdomen but reports that this often is present when she vomits. Denies dysuria, vaginal complaints, back pain, chest pain, shortness of breath, fevers, diarrhea, constipation, blood in stool, blood in vomit, recent sickness, dizziness or weakness. Reports last bowel movement was yesterday and described as normal for her.  Patient reports that she works in a [employer] and had to call out of work today and needs work note.  Patient's last menstrual period was 03/04/2016. Denies concerns of pregnancy.  Past Medical History:  Diagnosis Date  . Anemia   . Obesity affecting pregnancy    Dermoid  Cyst    Patient Active Problem List   Diagnosis Date Noted  . Nausea & vomiting 05/03/2015  . Labor and delivery indication for care or intervention 04/14/2015  . Absolute anemia 02/22/2015  . Obesity affecting pregnancy 02/22/2015  . Obesity in pregnancy, antepartum 01/11/2015  . First trimester screening 12/07/2014    Past Surgical History:  Procedure Laterality Date  . CHOLECYSTECTOMY    . OVARIAN CYST REMOVAL     No current facility-administered medications for this encounter.   Current Outpatient Prescriptions:  .  aspirin EC 81 MG tablet, Take 1 tablet (81 mg total) by mouth daily., Disp: 30 tablet, Rfl: 11 .  ferrous fumarate (HEMOCYTE - 106 MG FE) 325 (106 FE) MG TABS tablet, Take 1 tablet by mouth daily., Disp: , Rfl:  .  folic acid (FOLVITE) 1 MG tablet, Take 1 tablet (1 mg total) by mouth daily., Disp: 30 tablet, Rfl: 11 .  metoCLOPramide (REGLAN) 10 MG tablet, Take 1 tablet (10 mg total) by mouth every 8 (eight) hours as needed for nausea or vomiting. (Patient not taking: Reported on 01/09/2015), Disp: 12 tablet, Rfl: 1 .  ondansetron (ZOFRAN ODT) 4 MG disintegrating tablet, Take 1 tablet (4 mg total) by mouth every 8 (eight) hours as needed for nausea or vomiting., Disp: 15 tablet, Rfl: 0 .  Prenatal Vit-Fe Fumarate-FA (PRENATAL MULTIVITAMIN) TABS tablet, Take 1 tablet by mouth daily at 12 noon., Disp: , Rfl:  .  pyridOXINE (VITAMIN B-6) 50 MG tablet, Take 1.5 tablets (75 mg total) by mouth daily., Disp: 45 tablet, Rfl: 0  Allergies Review of patient's allergies indicates no known allergies.  Family History  Problem Relation Age  of Onset  . Hypertension Father   . Diabetes Father     Social History Social History  Substance Use Topics  . Smoking status: Current Every Day Smoker    Packs/day: 0.50    Types: Cigarettes  . Smokeless tobacco: Never Used  . Alcohol use No    Review of Systems Constitutional: No fever/chills Eyes: No visual changes. ENT: No  sore throat. Cardiovascular: Denies chest pain. Respiratory: Denies shortness of breath. Gastrointestinal: As above. No diarrhea.  No constipation. Genitourinary: Negative for dysuria. Musculoskeletal: Negative for back pain. Skin: Negative for rash. Neurological: Negative for headaches, focal weakness or numbness.  10-point ROS otherwise negative.  ____________________________________________   PHYSICAL EXAM:  VITAL SIGNS: ED Triage Vitals  Enc Vitals Group     BP 03/15/16 1428 119/60     Pulse Rate 03/15/16 1428 71     Resp 03/15/16 1428 20     Temp 03/15/16 1428 98.2 F (36.8 C)     Temp src --      SpO2 03/15/16 1428 100 %     Weight 03/15/16 1428 287 lb (130.2 kg)     Height 03/15/16 1428  (1.651 m)     Head Circumference --      Peak Flow --      Pain Score 03/15/16 1431 2     Pain Loc --      Pain Edu? --      Excl. in GC? --     Constitutional: Alert and oriented. Well appearing and in no acute distress. Eyes: Conjunctivae are normal. PERRL. EOMI. ENT      Head: Normocephalic and atraumatic.      Nose: No congestion/rhinnorhea.      Mouth/Throat: Mucous membranes are moist.Oropharynx non-erythematous. Hematological/Lymphatic/Immunilogical: No cervical lymphadenopathy. Cardiovascular: Normal rate, regular rhythm. Grossly normal heart sounds.  Good peripheral circulation. Respiratory: Normal respiratory effort without tachypnea nor retractions. Breath sounds are clear and equal bilaterally. No wheezes/rales/rhonchi.. Gastrointestinal: Soft and nontender. Obese abdomen. Normal Bowel sounds. No CVA tenderness. Musculoskeletal:  Nontender with normal range of motion in all extremities. No midline cervical, thoracic or lumbar tenderness to palpation.  Neurologic:  Normal speech and language. No gross focal neurologic deficits are appreciated. Speech is normal. No gait instability.  Skin:  Skin is warm, dry and intact. No rash noted. Psychiatric: Mood and affect  are normal. Speech and behavior are normal. Patient exhibits appropriate insight and judgment   ___________________________________________   LABS (all labs ordered are listed, but only abnormal results are displayed)  Labs Reviewed  URINALYSIS COMPLETEWITH MICROSCOPIC ([facility] ONLY) - Abnormal; Notable for the following:       Result Value   APPearance CLOUDY (*)    Protein, ur 30 (*)    Leukocytes, UA TRACE (*)    Bacteria, UA RARE (*)    Squamous Epithelial / LPF TOO NUMEROUS TO COUNT (*)    All other components within normal limits  URINE CULTURE  PREGNANCY, URINE   ____________________________________________   PROCEDURES Procedures    INITIAL IMPRESSION / ASSESSMENT AND PLAN / ED COURSE  Pertinent labs & imaging results that were available during my care of the patient were reviewed by me and considered in my medical decision making (see chart for details).  Very well-appearing patient. No acute distress. Presents for the complaints of nausea and vomiting episodes. Abdomen soft and nontender. Patient reports that she was having some mild suprapubic discomfort, will evaluate urinalysis. Suspect viral gastroenteritis versus food related  vomiting. As patient reports that she is overall feeling better and tolerating oral fluids will treat supportively and symptomatically. 8 mg ODT Zofran 1 given in urgent care.  Urinalysis reviewed. Suspect contaminated urinalysis, will culture. Urine pregnancy negative. Discussed this with patient. Will treat patient as discussed with when necessary Zofran. Encouraged rest, fluids. Work note for today and tomorrow given. Follow primary care physician as needed.  Discussed follow up with Primary care physician this week. Discussed follow up and return parameters including no resolution or any worsening concerns. Patient verbalized understanding and agreed to plan.   ____________________________________________   FINAL CLINICAL IMPRESSION(S) /  ED DIAGNOSES  Final diagnoses:  Non-intractable vomiting with nausea, vomiting of unspecified type     Discharge Medication List as of 03/15/2016  3:26 PM    START taking these medications   Details  ondansetron (ZOFRAN ODT) 4 MG disintegrating tablet Take 1 tablet (4 mg total) by mouth every 8 (eight) hours as needed for nausea or vomiting., Starting Sat 03/15/2016, Normal        Note: This dictation was prepared with Dragon dictation along with smaller phrase technology. Any transcriptional errors that result from this process are unintentional.    Clinical Course      Renford Dills, NP 03/15/16 1558    Renford Dills, NP 03/15/16 1559

## 2016-03-15 NOTE — Discharge Instructions (Signed)
Take medication as prescribed. Rest. Drink plenty of fluids.  ° °Follow up with your primary care physician this week as needed. Return to Urgent care for new or worsening concerns.  ° °

## 2016-03-15 NOTE — ED Triage Notes (Signed)
Pt reports eating chicken yesterday afternoon that tasted "funny" and then became ill around 6:00 p.m. Yesterday with vomiting x 6 times since then. No diarrhea. Feels pressure in low abd.

## 2016-03-18 LAB — URINE CULTURE: Special Requests: NORMAL

## 2016-07-26 ENCOUNTER — Ambulatory Visit
Admission: EM | Admit: 2016-07-26 | Discharge: 2016-07-26 | Disposition: A | Discharge status: Home / Self Care | Payer: Medicaid Other | Attending: Family Medicine | Admitting: Family Medicine

## 2016-07-26 ENCOUNTER — Encounter: Payer: Self-pay | Admitting: Gynecology

## 2016-07-26 DIAGNOSIS — R112 Nausea with vomiting, unspecified: Secondary | ICD-10-CM | POA: Diagnosis not present

## 2016-07-26 DIAGNOSIS — N926 Irregular menstruation, unspecified: Secondary | ICD-10-CM

## 2016-07-26 DIAGNOSIS — F17210 Nicotine dependence, cigarettes, uncomplicated: Secondary | ICD-10-CM | POA: Insufficient documentation

## 2016-07-26 DIAGNOSIS — R197 Diarrhea, unspecified: Secondary | ICD-10-CM | POA: Diagnosis not present

## 2016-07-26 LAB — URINALYSIS, COMPLETE (UACMP) WITH MICROSCOPIC
Bilirubin Urine: NEGATIVE
Glucose, UA: NEGATIVE mg/dL
Hgb urine dipstick: NEGATIVE
Ketones, ur: NEGATIVE mg/dL
Nitrite: NEGATIVE
PH: 6.5 (ref 5.0–8.0)
Protein, ur: NEGATIVE mg/dL
SPECIFIC GRAVITY, URINE: 1.025 (ref 1.005–1.030)

## 2016-07-26 LAB — PREGNANCY, URINE: PREG TEST UR: NEGATIVE

## 2016-07-26 MED ORDER — ONDANSETRON 8 MG PO TBDP
8.0000 mg | ORAL_TABLET | Freq: Once | ORAL | Status: AC
  Administered 2016-07-26: 8 mg via ORAL

## 2016-07-26 MED ORDER — ONDANSETRON 4 MG PO TBDP: 4.0000 mg | ORAL_TABLET | Freq: Three times a day (TID) | ORAL | 0 refills | Status: DC | PRN

## 2016-07-26 NOTE — ED Provider Notes (Signed)
[facility] ____________________________________________  Time seen: Approximately 3:44 PM  I have reviewed the triage vital signs and the nursing notes.   HISTORY  Chief Complaint Nausea and Emesis  HPI Melanie [name] is a 30 y.o. female presents for complaints of nausea, vomiting diarrhea. 31Patient reports approximately 8 PM last night she ate fast food fried chicken at [employer]. Patient reports within a few hours she began to feel queasy and started to have vomiting and diarrhea. States abdominal cramping sensation, that worsens right before needing to vomit or have diarrhea. Reports that cramping sensation then improves after vomiting or diarrhea. Patient states last diarrhea episode was approximately 9 AM this morning. Last vomiting episode approximately 3 hours ago. Patient reports that since sitting in exam room drinking ginger ale, her stomach is feeling better. Denies fevers. Denies other known triggers. Denies recent sick contacts. Patient reports that she works in [employer] and needed a work note.  Patient also wanted a urine pregnancy test, as she reports that she has not yet had her menstrual this month. Reports last menstrual was 5 weeks ago. Reports last sexual activity was approximately 4 months ago. Denies any recent sexual activity. Denies concerns of pregnancy. Denies vaginal complaints. Denies fevers, urinary frequency, urinary urgency, burning with urination, back pain, dizziness, weakness, extremity pain. Denies recent sickness. Denies hematochezia, melena or abnormal colored stools or vomit.   [facility]CP  Past Medical History:  Diagnosis Date  . Anemia   . Obesity affecting pregnancy    Dermoid Cyst    Patient Active Problem List   Diagnosis Date Noted  . Nausea & vomiting 05/03/2015  . Labor and delivery indication for care or intervention 04/14/2015  . Absolute anemia 02/22/2015  . Obesity affecting pregnancy  02/22/2015  . Obesity in pregnancy, antepartum 01/11/2015  . First trimester screening 12/07/2014    Past Surgical History:  Procedure Laterality Date  . CHOLECYSTECTOMY    . OVARIAN CYST REMOVAL       No current facility-administered medications for this encounter.   Current Outpatient Prescriptions:  .  aspirin EC 81 MG tablet, Take 1 tablet (81 mg total) by mouth daily., Disp: 30 tablet, Rfl: 11 .  ferrous fumarate (HEMOCYTE - 106 MG FE) 325 (106 FE) MG TABS tablet, Take 1 tablet by mouth daily., Disp: , Rfl:  .  folic acid (FOLVITE) 1 MG tablet, Take 1 tablet (1 mg total) by mouth daily., Disp: 30 tablet, Rfl: 11 .  metoCLOPramide (REGLAN) 10 MG tablet, Take 1 tablet (10 mg total) by mouth every 8 (eight) hours as needed for nausea or vomiting. (Patient not taking: Reported on 01/09/2015), Disp: 12 tablet, Rfl: 1 .  ondansetron (ZOFRAN ODT) 4 MG disintegrating tablet, Take 1 tablet (4 mg total) by mouth every 8 (eight) hours as needed for nausea or vomiting., Disp: 15 tablet, Rfl: 0 .  Prenatal Vit-Fe Fumarate-FA (PRENATAL MULTIVITAMIN) TABS tablet, Take 1 tablet by mouth daily at 12 noon., Disp: , Rfl:  .  pyridOXINE (VITAMIN B-6) 50 MG tablet, Take 1.5 tablets (75 mg total) by mouth daily., Disp: 45 tablet, Rfl: 0  Allergies Patient has no known allergies.  Family History  Problem Relation Age of Onset  . Hypertension Father   . Diabetes Father     Social History Social History  Substance Use Topics  . Smoking status: Current Every Day Smoker    Packs/day: 0.50    Types: Cigarettes  . Smokeless tobacco: Never Used  .  Alcohol use No    Review of Systems  Constitutional: No fever/chills Eyes: No visual changes. ENT: No sore throat.  Cardiovascular: Denies chest pain. Respiratory: Denies shortness of breath. Gastrointestinal:As above.  Genitourinary: Negative for dysuria. Musculoskeletal: Negative for back pain. Skin: Negative for rash. Neurological: Negative for  headaches, focal weakness or numbness.  10-point ROS otherwise negative.  ____________________________________________   PHYSICAL EXAM:  VITAL SIGNS: ED Triage Vitals  Enc Vitals Group     BP 07/26/16 1515 (!) 109/50     Pulse Rate 07/26/16 1515 60     Resp 07/26/16 1515 18     Temp 07/26/16 1515 98.1 F (36.7 C)     Temp Source 07/26/16 1515 Oral     SpO2 07/26/16 1515 100 %     Weight 07/26/16 1519 296 lb (134.3 kg)     Height 07/26/16 1519 5\' 5"  (1.651 m)     Head Circumference --      Peak Flow --      Pain Score 07/26/16 1521 5     Pain Loc --      Pain Edu? --      Excl. in GC? --     Constitutional: Alert and oriented. Well appearing and in no acute distress. Eyes: Conjunctivae are normal. PERRL. EOMI. ENT      Head: Normocephalic and atraumatic.      Nose: No congestion/rhinnorhea.      Mouth/Throat: Mucous membranes are moist. Oropharynx non-erythematous. No tonsillar swelling or exudate.  Neck: No stridor. Supple without meningismus.  Hematological/Lymphatic/Immunilogical: No cervical lymphadenopathy. Cardiovascular: Normal rate, regular rhythm. Grossly normal heart sounds.  Good peripheral circulation. Respiratory: Normal respiratory effort without tachypnea nor retractions. Breath sounds are clear and equal bilaterally. No wheezes/rales/rhonchi. Gastrointestinal: Minimal diffuse abdominal tenderness, no point tenderness. No guarding. Obese abdomen. Normal Bowel sounds. No CVA tenderness. Musculoskeletal:  Nontender with normal range of motion in all extremities. No midline cervical, thoracic or lumbar tenderness to palpation.  Neurologic:  Normal speech and language. Speech is normal. No gait instability.  Skin:  Skin is warm, dry and intact. No rash noted. Psychiatric: Mood and affect are normal. Speech and behavior are normal. Patient exhibits appropriate insight and judgment.    ___________________________________________   LABS (all labs ordered are  listed, but only abnormal results are displayed)  Labs Reviewed  URINALYSIS, COMPLETE ([facility]) WITH MICROSCOPIC - Abnormal; Notable for the following:       Result Value   APPearance HAZY (*)    Leukocytes, UA SMALL (*)    Squamous Epithelial / LPF 6-30 (*)    Bacteria, UA FEW (*)    All other components within normal limits  URINE CULTURE  PREGNANCY, URINE   ____________________________________________   PROCEDURES Procedures    INITIAL IMPRESSION / ASSESSMENT AND PLAN / ED COURSE  Pertinent labs & imaging results that were available during my care of the patient were reviewed by me and considered in my medical decision making (see chart for details).  Well-appearing patient. No acute distress. Drinking fluids in urgent care. 8 mg ODT Zofran given once. Suspect viral versus food related vomiting and diarrhea. Encouraged supportive care. Urinalysis reviewed. Concern for contaminated sample and will await urine culture prior to initiating antibiotics. Supportive treatment with when necessary Zofran as needed. Work note given for today and tomorrow. Discussed BRAT diet. Urine pregnancy test negative. No recent sexual intercourse. Discussed monitoring for menstrual and follow-up with primary care OB/GYN if not received a menstrual cycle  in the next week.Discussed indication, risks and benefits of medications with patient.  Discussed follow up with Primary care physician this week. Discussed follow up and return parameters including no resolution or any worsening concerns. Patient verbalized understanding and agreed to plan.   ____________________________________________   FINAL CLINICAL IMPRESSION(S) / ED DIAGNOSES  Final diagnoses:  Nausea vomiting and diarrhea  Missed period     Discharge Medication List as of 07/26/2016  4:03 PM      Note: This dictation was prepared with Dragon dictation along with smaller phrase technology. Any transcriptional errors that result from this  process are unintentional.    Clinical Course       Renford Dills, NP 07/26/16 1810

## 2016-07-26 NOTE — ED Triage Notes (Signed)
Per patient after eating a meal from [employer] around pm, NVD started around midnight. Per patient also miss period for this month.

## 2016-07-26 NOTE — Discharge Instructions (Signed)
Take medication as prescribed. Rest. Drink plenty of fluids.  ° °Follow up with your primary care physician this week as needed. Return to Urgent care for new or worsening concerns.  ° °

## 2016-07-28 LAB — URINE CULTURE

## 2016-08-01 ENCOUNTER — Telehealth: Payer: Self-pay

## 2016-09-13 ENCOUNTER — Ambulatory Visit
Admission: EM | Admit: 2016-09-13 | Discharge: 2016-09-13 | Disposition: A | Discharge status: Home / Self Care | Payer: Medicaid Other | Attending: Family Medicine | Admitting: Family Medicine

## 2016-09-13 ENCOUNTER — Ambulatory Visit
Admission: RE | Admit: 2016-09-13 | Discharge: 2016-09-13 | Disposition: A | Discharge status: Home / Self Care | Payer: Medicaid Other | Source: Ambulatory Visit | Attending: Family Medicine | Admitting: Family Medicine

## 2016-09-13 DIAGNOSIS — B9689 Other specified bacterial agents as the cause of diseases classified elsewhere: Secondary | ICD-10-CM | POA: Insufficient documentation

## 2016-09-13 DIAGNOSIS — Z9889 Other specified postprocedural states: Secondary | ICD-10-CM | POA: Diagnosis not present

## 2016-09-13 DIAGNOSIS — Z8742 Personal history of other diseases of the female genital tract: Secondary | ICD-10-CM

## 2016-09-13 DIAGNOSIS — B356 Tinea cruris: Secondary | ICD-10-CM | POA: Insufficient documentation

## 2016-09-13 DIAGNOSIS — A599 Trichomoniasis, unspecified: Secondary | ICD-10-CM | POA: Insufficient documentation

## 2016-09-13 DIAGNOSIS — R102 Pelvic and perineal pain: Secondary | ICD-10-CM | POA: Diagnosis not present

## 2016-09-13 DIAGNOSIS — R112 Nausea with vomiting, unspecified: Secondary | ICD-10-CM | POA: Insufficient documentation

## 2016-09-13 DIAGNOSIS — N760 Acute vaginitis: Secondary | ICD-10-CM | POA: Insufficient documentation

## 2016-09-13 LAB — URINALYSIS, COMPLETE (UACMP) WITH MICROSCOPIC
BILIRUBIN URINE: NEGATIVE
Glucose, UA: NEGATIVE mg/dL
Hgb urine dipstick: NEGATIVE
NITRITE: NEGATIVE
PH: 5.5 (ref 5.0–8.0)
Protein, ur: NEGATIVE mg/dL
RBC / HPF: NONE SEEN RBC/hpf (ref 0–5)

## 2016-09-13 LAB — WET PREP, GENITAL
Sperm: NONE SEEN
Yeast Wet Prep HPF POC: NONE SEEN

## 2016-09-13 LAB — CHLAMYDIA/NGC RT PCR (ARMC ONLY)
CHLAMYDIA TR: NOT DETECTED
N gonorrhoeae: NOT DETECTED

## 2016-09-13 LAB — PREGNANCY, URINE: PREG TEST UR: NEGATIVE

## 2016-09-13 MED ORDER — METRONIDAZOLE 500 MG PO TABS: 500.0000 mg | ORAL_TABLET | Freq: Two times a day (BID) | ORAL | 0 refills | Status: DC

## 2016-09-13 MED ORDER — ONDANSETRON 8 MG PO TBDP: 8.0000 mg | ORAL_TABLET | Freq: Three times a day (TID) | ORAL | 0 refills | Status: DC | PRN

## 2016-09-13 MED ORDER — ONDANSETRON 8 MG PO TBDP
8.0000 mg | ORAL_TABLET | Freq: Once | ORAL | Status: AC
  Administered 2016-09-13: 8 mg via ORAL

## 2016-09-13 MED ORDER — KETOCONAZOLE 2 % EX CREA: 1.0000 "application " | TOPICAL_CREAM | Freq: Two times a day (BID) | CUTANEOUS | 0 refills | Status: DC

## 2016-09-13 MED ORDER — FLUCONAZOLE 150 MG PO TABS: 150.0000 mg | ORAL_TABLET | Freq: Once | ORAL | 0 refills | Status: AC

## 2016-09-13 NOTE — ED Triage Notes (Addendum)
Pt c/o lower right ovary/abdominal pain. She had a cyst removed from an ovary on that side around 2013, she says it has been hurting on and off for about 2 weeks and she has just been trying to ignore. She is dry heaving this morning. She also mentions she was sexually active last month and didn't have a period last month or this month. Stabbing sharp pain.

## 2016-09-13 NOTE — ED Provider Notes (Addendum)
[facility]    CSN: [id number] Arrival date & time: 09/13/16  1009     History   Chief Complaint Chief Complaint  Patient presents with  . Emesis    HPI Melanie [name] is a 31 y.o. female.   Patient is 31 year old black female reports having lower abdominal pain for about 2 weeks. Started having nausea and vomiting today and threw up several times at work 2. the center out. She proceeded down pain is progressively seem to be getting worse as well. She's had 2 surgeries for ovarian cyst she had according to her a rather large ovarian cyst on the right ovary and another cyst on the left ovary as well. Subsequent to that she's had a laparoscopic surgery to remove another cyst on her right ovary. She reports having sexual relations about 3 weeks ago unprotected she's not had a missed. Since December. She denies seeing dyspareunia but did have unprotected sexual relations when she did have sexual relations she denies having any vaginal discharge the significance. She is a gravida 2 Ab1 para 1. She does smoke no history of allergies and she has had a cholecystectomy. Strong family history of cancer on her father's side of family father also has hypertension diabetes. Her last menstrual period was in December and she is not any type of birth control   The history is provided by the patient. No language interpreter was used.  Abdominal Pain  Pain location:  Suprapubic Pain quality: cramping and pressure   Pain radiates to:  Does not radiate Pain severity:  Moderate Onset quality:  Sudden Duration:  2 weeks Timing:  Constant Progression:  Worsening Chronicity:  New Context: previous surgery, recent sexual activity and retching   Context: not medication withdrawal   Relieved by:  Nothing Worsened by:  Nothing Ineffective treatments:  None tried Associated symptoms: vomiting   Associated symptoms: no dysuria, no vaginal bleeding and no vaginal discharge      Past Medical History:  Diagnosis Date  . Anemia   . Obesity affecting pregnancy    Dermoid Cyst    Patient Active Problem List   Diagnosis Date Noted  . Nausea & vomiting 05/03/2015  . Labor and delivery indication for care or intervention 04/14/2015  . Absolute anemia 02/22/2015  . Obesity affecting pregnancy 02/22/2015  . Obesity in pregnancy, antepartum 01/11/2015  . First trimester screening 12/07/2014    Past Surgical History:  Procedure Laterality Date  . CHOLECYSTECTOMY    . OVARIAN CYST REMOVAL      OB History    Gravida Para Term Preterm AB Living   2 0 0 0 1 0   SAB TAB Ectopic Multiple Live Births     1 0 0        Obstetric Comments   Being see @ [place] due to "High Risk" pregnancy - weight related. [name], [name]        Home Medications    Prior to Admission medications   Medication Sig Start Date End Date Taking? Authorizing Provider  aspirin EC 81 MG tablet Take 1 tablet (81 mg total) by mouth daily. 02/22/15   Lady DeutscherAndra James, MD  ferrous fumarate (HEMOCYTE - 106 MG FE) 325 (106 FE) MG TABS tablet Take 1 tablet by mouth daily.    Historical Provider, MD  fluconazole (DIFLUCAN) 150 MG tablet Take 1 tablet (150 mg total) by mouth once. 09/13/16 09/13/16  Hassan RowanEugene Shanessa Hodak, MD  folic acid (FOLVITE) 1 MG tablet Take  1 tablet (1 mg total) by mouth daily. 02/22/15   Lady Deutscher, MD  ketoconazole (NIZORAL) 2 % cream Apply 1 application topically 2 (two) times daily. 09/13/16   Hassan Rowan, MD  metoCLOPramide (REGLAN) 10 MG tablet Take 1 tablet (10 mg total) by mouth every 8 (eight) hours as needed for nausea or vomiting. Patient not taking: Reported on 01/09/2015 01/03/15 01/03/16  Myrna Blazer, MD  metroNIDAZOLE (FLAGYL) 500 MG tablet Take 1 tablet (500 mg total) by mouth 2 (two) times daily. 09/13/16   Hassan Rowan, MD  ondansetron (ZOFRAN ODT) 8 MG disintegrating tablet Take 1 tablet (8 mg total) by mouth every 8 (eight) hours as needed for nausea or  vomiting. 09/13/16   Hassan Rowan, MD  pyridOXINE (VITAMIN B-6) 50 MG tablet Take 1.5 tablets (75 mg total) by mouth daily. 01/03/15   Myrna Blazer, MD    Family History Family History  Problem Relation Age of Onset  . Hypertension Father   . Diabetes Father     Social History Social History  Substance Use Topics  . Smoking status: Current Every Day Smoker    Packs/day: 0.50    Types: Cigarettes  . Smokeless tobacco: Never Used  . Alcohol use No     Allergies   Patient has no known allergies.   Review of Systems Review of Systems  Gastrointestinal: Positive for abdominal pain and vomiting.  Genitourinary: Positive for frequency and pelvic pain. Negative for decreased urine volume, difficulty urinating, dyspareunia, dysuria, flank pain, urgency, vaginal bleeding, vaginal discharge and vaginal pain.  All other systems reviewed and are negative.    Physical Exam Triage Vital Signs ED Triage Vitals  Enc Vitals Group     BP 09/13/16 1057 116/60     Pulse Rate 09/13/16 1057 63     Resp 09/13/16 1057 18     Temp 09/13/16 1057 98.4 F (36.9 C)     Temp Source 09/13/16 1057 Oral     SpO2 09/13/16 1057 100 %     Weight 09/13/16 1057 296 lb (134.3 kg)     Height 09/13/16 1057 5\' 5"  (1.651 m)     Head Circumference --      Peak Flow --      Pain Score 09/13/16 1100 10     Pain Loc --      Pain Edu? --      Excl. in GC? --    No data found.   Updated Vital Signs BP 116/60 (BP Location: Left Arm)   Pulse 63   Temp 98.4 F (36.9 C) (Oral)   Resp 18   Ht 5\' 5"  (1.651 m)   Wt 296 lb (134.3 kg)   LMP 08/03/2016   SpO2 100%   BMI 49.26 kg/m   Visual Acuity Right Eye Distance:   Left Eye Distance:   Bilateral Distance:    Right Eye Near:   Left Eye Near:    Bilateral Near:     Physical Exam  Constitutional: She is oriented to person, place, and time. She appears well-developed and well-nourished.  HENT:  Head: Normocephalic and atraumatic.  Eyes:  EOM are normal. Pupils are equal, round, and reactive to light.  Neck: Normal range of motion.  Cardiovascular: Normal rate, regular rhythm and normal heart sounds.   Pulmonary/Chest: Effort normal and breath sounds normal.  Abdominal: Soft. There is no hepatosplenomegaly. There is tenderness in the suprapubic area. No hernia. Hernia confirmed negative in the right inguinal area  and confirmed negative in the left inguinal area.    Genitourinary: Rectum normal and uterus normal. Rectal exam shows no external hemorrhoid, no fissure, no mass and no tenderness. There is rash on the right labia. There is no tenderness or lesion on the right labia. There is rash on the left labia. There is no tenderness or lesion on the left labia. Uterus is not enlarged. Cervix exhibits no motion tenderness and no friability. Right adnexum displays tenderness. Left adnexum displays tenderness. Vaginal discharge found.  Musculoskeletal: Normal range of motion.  Lymphadenopathy:       Left: Inguinal adenopathy present.  Neurological: She is alert and oriented to person, place, and time.  Skin: Skin is warm.  Psychiatric: She has a normal mood and affect.  Vitals reviewed.    UC Treatments / Results  Labs (all labs ordered are listed, but only abnormal results are displayed) Labs Reviewed  WET PREP, GENITAL - Abnormal; Notable for the following:       Result Value   Trich, Wet Prep PRESENT (*)    Clue Cells Wet Prep HPF POC PRESENT (*)    WBC, Wet Prep HPF POC MODERATE (*)    All other components within normal limits  URINALYSIS, COMPLETE (UACMP) WITH MICROSCOPIC - Abnormal; Notable for the following:    APPearance CLOUDY (*)    Specific Gravity, Urine >1.030 (*)    Ketones, ur TRACE (*)    Leukocytes, UA SMALL (*)    Squamous Epithelial / LPF 6-30 (*)    Bacteria, UA MANY (*)    All other components within normal limits  CHLAMYDIA/NGC RT PCR ([facility] ONLY)  URINE CULTURE  PREGNANCY, URINE     EKG  EKG Interpretation None       Radiology US Transvaginal Non-ob  Result Date: 09/13/2016 CLINICAL DATA:  Two weeks pelvic pain EXAM: TRANSABDOMINAL AND TRANSVAGINAL ULTRASOUND OF PELVIS TECHNIQUE: Both transabdominal and transvaginal ultrasound examinations of the pelvis were performed. Transabdominal technique was performed for global imaging of the pelvis including uterus, ovaries, adnexal regions, and pelvic cul-de-sac. It was necessary to proceed with endovaginal exam following the transabdominal exam to visualize the ovaries. COMPARISON:  11/13/2014 FINDINGS: Uterus Measurements: 8.1 x 4.2 x 4.9 cm. No fibroids or other mass visualized. Endometrium Thickness: 1.3 mm.  No focal abnormality visualized. Right ovary Measurements: 2.5 x 2.6 x 2.9 cm. Small echogenic lesion is again seen similar to that noted on the prior exam. This is again most consistent with a small dermoid. Left ovary Measurements: 3.0 x 2.9 x 3.2 cm. An echogenic lesion is noted adjacent to the left ovary measuring 4.4 x 4.7 cm. This is stable from prior exam of 11/13/2014 and likely representing dermoid. Other findings No abnormal free fluid. IMPRESSION: Stable bilateral dermoids. No acute abnormality noted. Electronically Signed   By: Alcide Clever M.D.   On: 09/13/2016 15:06   US Pelvis Complete  Result Date: 09/13/2016 CLINICAL DATA:  Two weeks pelvic pain EXAM: TRANSABDOMINAL AND TRANSVAGINAL ULTRASOUND OF PELVIS TECHNIQUE: Both transabdominal and transvaginal ultrasound examinations of the pelvis were performed. Transabdominal technique was performed for global imaging of the pelvis including uterus, ovaries, adnexal regions, and pelvic cul-de-sac. It was necessary to proceed with endovaginal exam following the transabdominal exam to visualize the ovaries. COMPARISON:  11/13/2014 FINDINGS: Uterus Measurements: 8.1 x 4.2 x 4.9 cm. No fibroids or other mass visualized. Endometrium Thickness: 1.3 mm.  No focal  abnormality visualized. Right ovary Measurements: 2.5 x 2.6 x 2.9 cm.  Small echogenic lesion is again seen similar to that noted on the prior exam. This is again most consistent with a small dermoid. Left ovary Measurements: 3.0 x 2.9 x 3.2 cm. An echogenic lesion is noted adjacent to the left ovary measuring 4.4 x 4.7 cm. This is stable from prior exam of 11/13/2014 and likely representing dermoid. Other findings No abnormal free fluid. IMPRESSION: Stable bilateral dermoids. No acute abnormality noted. Electronically Signed   By: Alcide Clever M.D.   On: 09/13/2016 15:06    Procedures Procedures (including critical care time)  Medications Ordered in UC Medications  ondansetron (ZOFRAN-ODT) disintegrating tablet 8 mg (8 mg Oral Given 09/13/16 1159)    Results for orders placed or performed during the hospital encounter of 09/13/16  Wet prep, genital  Result Value Ref Range   Yeast Wet Prep HPF POC NONE SEEN NONE SEEN   Trich, Wet Prep PRESENT (A) NONE SEEN   Clue Cells Wet Prep HPF POC PRESENT (A) NONE SEEN   WBC, Wet Prep HPF POC MODERATE (A) NONE SEEN   Sperm NONE SEEN   Pregnancy, urine  Result Value Ref Range   Preg Test, Ur NEGATIVE NEGATIVE  Urinalysis, Complete w Microscopic  Result Value Ref Range   Color, Urine YELLOW YELLOW   APPearance CLOUDY (A) CLEAR   Specific Gravity, Urine >1.030 (H) 1.005 - 1.030   pH 5.5 5.0 - 8.0   Glucose, UA NEGATIVE NEGATIVE mg/dL   Hgb urine dipstick NEGATIVE NEGATIVE   Bilirubin Urine NEGATIVE NEGATIVE   Ketones, ur TRACE (A) NEGATIVE mg/dL   Protein, ur NEGATIVE NEGATIVE mg/dL   Nitrite NEGATIVE NEGATIVE   Leukocytes, UA SMALL (A) NEGATIVE   Squamous Epithelial / LPF 6-30 (A) NONE SEEN   WBC, UA 6-30 0 - 5 WBC/hpf   RBC / HPF NONE SEEN 0 - 5 RBC/hpf   Bacteria, UA MANY (A) NONE SEEN   Initial Impression / Assessment and Plan / UC Course  I have reviewed the triage vital signs and the nursing notes.  Pertinent labs & imaging results  that were available during my care of the patient were reviewed by me and considered in my medical decision making (see chart for details).   patient with vaginitis both bacterial and trichomonas and yeast infection on the perineum. Will treat with Flagyl and Nizoral cream for the outside Diflucan tablets. Despite the trichomonas and Bactroban she doses can explain the abdominal pain and pelvic pain from that infection so we'll proceed with ultrasound of the uterus and ovaries to make sure that she does not have a recurrent ovarian cyst. Follow-up with the health department next week and will give her Zofran for nausea and work note for today and tomorrow as well.    Final Clinical Impressions(s) / UC Diagnoses   Final diagnoses:  Non-intractable vomiting with nausea, unspecified vomiting type  Pelvic pain  History of removal of ovarian cyst  Bacterial vaginosis  Trichimoniasis  Tinea cruris    New Prescriptions Discharge Medication List as of 09/13/2016 12:54 PM    START taking these medications   Details  fluconazole (DIFLUCAN) 150 MG tablet Take 1 tablet (150 mg total) by mouth once., Starting Sat 09/13/2016, Normal    ketoconazole (NIZORAL) 2 % cream Apply 1 application topically 2 (two) times daily., Starting Sat 09/13/2016, Normal    metroNIDAZOLE (FLAGYL) 500 MG tablet Take 1 tablet (500 mg total) by mouth 2 (two) times daily., Starting Sat 09/13/2016, Normal    ondansetron (  ZOFRAN ODT) 8 MG disintegrating tablet Take 1 tablet (8 mg total) by mouth every 8 (eight) hours as needed for nausea or vomiting., Starting Sat 09/13/2016, Normal        Patient was informed after the ultrasound reports was given to me that 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 Radiologist felt there free much stable. This is interesting since she thought that they were removed from the last surgery. Either they've come back or while she had laparoscopic and was evaluated they didn't remove them. In  regards recommend she go the health department her PCP and get referral to her GYN was to [facility] to be reevaluated for these dermoid cyst ovarian cyst.    Note: This dictation was prepared with Dragon dictation along with smaller phrase technology. Any transcriptional errors that result from this process are unintentional.    Hassan Rowan, MD 09/13/16 1257    Hassan Rowan, MD 09/13/16 1721

## 2016-09-13 NOTE — ED Notes (Signed)
Call[facility] and [facility] with [facility] and she advised to send the patient straight over.

## 2016-09-15 LAB — URINE CULTURE

## 2017-02-04 ENCOUNTER — Ambulatory Visit
Admission: EM | Admit: 2017-02-04 | Discharge: 2017-02-04 | Disposition: A | Discharge status: Home / Self Care | Payer: Medicaid Other | Attending: Family Medicine | Admitting: Family Medicine

## 2017-02-04 ENCOUNTER — Encounter: Payer: Self-pay | Admitting: *Deleted

## 2017-02-04 DIAGNOSIS — F17210 Nicotine dependence, cigarettes, uncomplicated: Secondary | ICD-10-CM | POA: Insufficient documentation

## 2017-02-04 DIAGNOSIS — Z7982 Long term (current) use of aspirin: Secondary | ICD-10-CM | POA: Insufficient documentation

## 2017-02-04 DIAGNOSIS — K529 Noninfective gastroenteritis and colitis, unspecified: Secondary | ICD-10-CM

## 2017-02-04 DIAGNOSIS — R112 Nausea with vomiting, unspecified: Secondary | ICD-10-CM | POA: Diagnosis present

## 2017-02-04 DIAGNOSIS — Z79899 Other long term (current) drug therapy: Secondary | ICD-10-CM | POA: Insufficient documentation

## 2017-02-04 MED ORDER — ONDANSETRON 8 MG PO TBDP: 8.0000 mg | ORAL_TABLET | Freq: Three times a day (TID) | ORAL | 0 refills | Status: DC | PRN

## 2017-02-04 MED ORDER — BISMUTH SUBSALICYLATE 262 MG PO CHEW: 524.0000 mg | CHEWABLE_TABLET | Freq: Three times a day (TID) | ORAL | 0 refills | Status: DC

## 2017-02-04 MED ORDER — ONDANSETRON 8 MG PO TBDP
8.0000 mg | ORAL_TABLET | Freq: Once | ORAL | Status: AC
  Administered 2017-02-04: 8 mg via ORAL

## 2017-02-04 MED ORDER — RANITIDINE HCL 150 MG PO CAPS: ORAL_CAPSULE | ORAL | 0 refills | Status: DC

## 2017-02-04 NOTE — ED Provider Notes (Signed)
[facility]    CSN: [id number] Arrival date & time: 02/04/17  0857     History   Chief Complaint Chief Complaint  Patient presents with  . Nausea  . Emesis    HPI Melanie [name] is a 31 y.o. female.   31 year old black female who states she started throwing up about 3:00 this morning. She states that last night she didn't feel well the nausea and diarrhea started early this morning. She works at a [employer] when in this morning but because the multiple trips to the bathroom throwing up her manager finally sent home and instructed her to get some rest she came here to be evaluated and seen. She states that she's had about 56 new stools as well as the nausea and vomiting and abdominal discomfort. The abdominal discomfort is all over. She's had some's cyst female was ordered surgeries she's had her gallbladder out she smokes. No chronic medical problems of obesity no pertinent family medical history relevant for today's visit.   The history is provided by the patient. No language interpreter was used.  Emesis  Severity:  Moderate Timing:  Constant Quality:  Stomach contents Progression:  Worsening Chronicity:  New Relieved by:  Nothing Worsened by:  Nothing Ineffective treatments:  None tried Associated symptoms: abdominal pain and diarrhea     Past Medical History:  Diagnosis Date  . Anemia   . Obesity affecting pregnancy    Dermoid Cyst    Patient Active Problem List   Diagnosis Date Noted  . Nausea & vomiting 05/03/2015  . Labor and delivery indication for care or intervention 04/14/2015  . Absolute anemia 02/22/2015  . Obesity affecting pregnancy 02/22/2015  . Obesity in pregnancy, antepartum 01/11/2015  . First trimester screening 12/07/2014    Past Surgical History:  Procedure Laterality Date  . CHOLECYSTECTOMY    . OVARIAN CYST REMOVAL      OB History    Gravida Para Term Preterm AB Living   2 0 0 0  1 0   SAB TAB Ectopic Multiple Live Births     1 0 0        Obstetric Comments   Being see @ [place] due to "High Risk" pregnancy - weight related. [name], [name]        Home Medications    Prior to Admission medications   Medication Sig Start Date End Date Taking? Authorizing Provider  aspirin EC 81 MG tablet Take 1 tablet (81 mg total) by mouth daily. 02/22/15   Lady Deutscher, MD  bismuth subsalicylate (PEPTO-BISMOL) 262 MG chewable tablet Chew 2 tablets (524 mg total) by mouth 3 (three) times daily. With Zantac for 3-5 days 02/04/17   Hassan Rowan, MD  ferrous fumarate (HEMOCYTE - 106 MG FE) 325 (106 FE) MG TABS tablet Take 1 tablet by mouth daily.    [provider]  folic acid (FOLVITE) 1 MG tablet Take 1 tablet (1 mg total) by mouth daily. 02/22/15   Lady Deutscher, MD  ketoconazole (NIZORAL) 2 % cream Apply 1 application topically 2 (two) times daily. 09/13/16   Hassan Rowan, MD  metoCLOPramide (REGLAN) 10 MG tablet Take 1 tablet (10 mg total) by mouth every 8 (eight) hours as needed for nausea or vomiting. Patient not taking: Reported on 01/09/2015 01/03/15 01/03/16  Myrna Blazer, MD  metroNIDAZOLE (FLAGYL) 500 MG tablet Take 1 tablet (500 mg total) by mouth 2 (two) times daily. 09/13/16   Hassan Rowan,  MD  ondansetron (ZOFRAN ODT) 8 MG disintegrating tablet Take 1 tablet (8 mg total) by mouth every 8 (eight) hours as needed for nausea or vomiting. 09/13/16   Hassan RowanWade, Ruqayya Ventress, MD  ondansetron (ZOFRAN ODT) 8 MG disintegrating tablet Take 1 tablet (8 mg total) by mouth every 8 (eight) hours as needed for nausea or vomiting. 02/04/17   Hassan RowanWade, Acire Tang, MD  pyridOXINE (VITAMIN B-6) 50 MG tablet Take 1.5 tablets (75 mg total) by mouth daily. 01/03/15   Myrna BlazerSchaevitz, David Matthew, MD  ranitidine (ZANTAC) 150 MG capsule 1 tablet by mouth 3 times a day with Pepto-Bismol for the next 3-5 days 02/04/17   Hassan RowanWade, Ailyne Pawley, MD    Family History Family History  Problem Relation Age of Onset  .  Hypertension Father   . Diabetes Father     Social History Social History  Substance Use Topics  . Smoking status: Current Every Day Smoker    Packs/day: 0.50    Types: Cigarettes  . Smokeless tobacco: Never Used  . Alcohol use Yes     Allergies   Patient has no known allergies.   Review of Systems Review of Systems  Gastrointestinal: Positive for abdominal pain, diarrhea and vomiting.  All other systems reviewed and are negative.    Physical Exam Triage Vital Signs ED Triage Vitals  Enc Vitals Group     BP 02/04/17 0933 112/77     Pulse Rate 02/04/17 0933 62     Resp 02/04/17 0933 16     Temp 02/04/17 0933 97.8 F (36.6 C)     Temp Source 02/04/17 0933 Oral     SpO2 02/04/17 0933 100 %     Weight 02/04/17 0934 280 lb (127 kg)     Height 02/04/17 0934 5\' 1"  (1.549 m)     Head Circumference --      Peak Flow --      Pain Score 02/04/17 0934 10     Pain Loc --      Pain Edu? --      Excl. in GC? --    No data found.   Updated Vital Signs BP 112/77 (BP Location: Left Arm)   Pulse 62   Temp 97.8 F (36.6 C) (Oral)   Resp 16   Ht 5\' 1"  (1.549 m)   Wt 280 lb (127 kg)   LMP 02/04/2017   SpO2 100%   BMI 52.91 kg/m   Visual Acuity Right Eye Distance:   Left Eye Distance:   Bilateral Distance:    Right Eye Near:   Left Eye Near:    Bilateral Near:     Physical Exam  Constitutional: She appears well-developed and well-nourished.  Cardiovascular: Normal rate and regular rhythm.   Pulmonary/Chest: Effort normal and breath sounds normal.  Abdominal: Soft. Normal appearance. She exhibits distension. There is no hepatosplenomegaly. There is tenderness. No hernia.    Musculoskeletal: Normal range of motion. She exhibits no edema or deformity.  Neurological: She is alert.  Skin: Skin is warm and dry.  Psychiatric: She has a normal mood and affect.  Vitals reviewed.    UC Treatments / Results  Labs (all labs ordered are listed, but only abnormal  results are displayed) Labs Reviewed  GASTROINTESTINAL PANEL BY PCR, STOOL (REPLACES STOOL CULTURE)    EKG  EKG Interpretation None       Radiology No results found.  Procedures Procedures (including critical care time)  Medications Ordered in UC Medications  ondansetron (ZOFRAN-ODT) disintegrating tablet  8 mg (8 mg Oral Given 02/04/17 1013)     Initial Impression / Assessment and Plan / UC Course  I have reviewed the triage vital signs and the nursing notes.  Pertinent labs & imaging results that were available during my care of the patient were reviewed by me and considered in my medical decision making (see chart for details).     Patient will be given a Zofran tablet here and prescription for Zofran. We'll place him Pepto-Bismol and Zantac combination 3 times a day for the next 3-5 days to treat the gastroenteritis will get stool PCR and if bacterial is present or anabiotic as well. She is much try Imodium over-the-counter for the diarrhea. Follow-up PCP in a week and not better. Work note given for today the fourth fifth and sixth she go back to work on the seventh  Final Clinical Impressions(s) / UC Diagnoses   Final diagnoses:  Gastroenteritis    New Prescriptions Discharge Medication List as of 02/04/2017 10:34 AM    START taking these medications   Details  bismuth subsalicylate (PEPTO-BISMOL) 262 MG chewable tablet Chew 2 tablets (524 mg total) by mouth 3 (three) times daily. With Zantac for 3-5 days, Starting Wed 02/04/2017, Normal    !! ondansetron (ZOFRAN ODT) 8 MG disintegrating tablet Take 1 tablet (8 mg total) by mouth every 8 (eight) hours as needed for nausea or vomiting., Starting Wed 02/04/2017, Normal    ranitidine (ZANTAC) 150 MG capsule 1 tablet by mouth 3 times a day with Pepto-Bismol for the next 3-5 days, Normal     !! - Potential duplicate medications found. Please discuss with provider.       Note: This dictation was prepared with Dragon  dictation along with smaller phrase technology. Any transcriptional errors that result from this process are unintentional.   Hassan Rowan, MD 02/04/17 1058

## 2017-02-04 NOTE — ED Triage Notes (Signed)
Patient started having symptoms of nausea, vomiting, and diarrhea this AM. 

## 2017-02-05 LAB — GASTROINTESTINAL PANEL BY PCR, STOOL (REPLACES STOOL CULTURE)

## 2017-11-05 ENCOUNTER — Emergency Department: Payer: Self-pay

## 2017-11-05 ENCOUNTER — Emergency Department
Admission: EM | Admit: 2017-11-05 | Discharge: 2017-11-05 | Disposition: A | Discharge status: Home / Self Care | Payer: Self-pay | Attending: Emergency Medicine | Admitting: Emergency Medicine

## 2017-11-05 ENCOUNTER — Other Ambulatory Visit: Payer: Self-pay

## 2017-11-05 ENCOUNTER — Encounter: Payer: Self-pay | Admitting: Emergency Medicine

## 2017-11-05 DIAGNOSIS — Z79899 Other long term (current) drug therapy: Secondary | ICD-10-CM | POA: Insufficient documentation

## 2017-11-05 DIAGNOSIS — J181 Lobar pneumonia, unspecified organism: Secondary | ICD-10-CM | POA: Insufficient documentation

## 2017-11-05 DIAGNOSIS — F17210 Nicotine dependence, cigarettes, uncomplicated: Secondary | ICD-10-CM | POA: Insufficient documentation

## 2017-11-05 DIAGNOSIS — J189 Pneumonia, unspecified organism: Secondary | ICD-10-CM

## 2017-11-05 DIAGNOSIS — Z7982 Long term (current) use of aspirin: Secondary | ICD-10-CM | POA: Insufficient documentation

## 2017-11-05 MED ORDER — ALBUTEROL SULFATE HFA 108 (90 BASE) MCG/ACT IN AERS: 2.0000 | INHALATION_SPRAY | Freq: Four times a day (QID) | RESPIRATORY_TRACT | 0 refills | Status: DC | PRN

## 2017-11-05 MED ORDER — AZITHROMYCIN 250 MG PO TABS: ORAL_TABLET | ORAL | 0 refills | Status: DC

## 2017-11-05 MED ORDER — PREDNISONE 20 MG PO TABS
60.0000 mg | ORAL_TABLET | Freq: Once | ORAL | Status: AC
  Administered 2017-11-05: 60 mg via ORAL
  Filled 2017-11-05: qty 3

## 2017-11-05 MED ORDER — IBUPROFEN 800 MG PO TABS
800.0000 mg | ORAL_TABLET | Freq: Once | ORAL | Status: AC
  Administered 2017-11-05: 800 mg via ORAL
  Filled 2017-11-05: qty 1

## 2017-11-05 MED ORDER — ALBUTEROL SULFATE (2.5 MG/3ML) 0.083% IN NEBU
2.5000 mg | INHALATION_SOLUTION | Freq: Once | RESPIRATORY_TRACT | Status: AC
  Administered 2017-11-05: 2.5 mg via RESPIRATORY_TRACT
  Filled 2017-11-05: qty 3

## 2017-11-05 MED ORDER — GUAIFENESIN-CODEINE 100-10 MG/5ML PO SYRP: 5.0000 mL | ORAL_SOLUTION | Freq: Three times a day (TID) | ORAL | 0 refills | Status: AC | PRN

## 2017-11-05 MED ORDER — IPRATROPIUM-ALBUTEROL 0.5-2.5 (3) MG/3ML IN SOLN
3.0000 mL | Freq: Once | RESPIRATORY_TRACT | Status: AC
Start: 2017-11-05 — End: 2017-11-05
  Administered 2017-11-05: 3 mL via RESPIRATORY_TRACT
  Filled 2017-11-05: qty 3

## 2017-11-05 MED ORDER — CEFTRIAXONE SODIUM 1 G IJ SOLR
1.0000 g | Freq: Once | INTRAMUSCULAR | Status: AC
  Administered 2017-11-05: 1 g via INTRAMUSCULAR
  Filled 2017-11-05: qty 10

## 2017-11-05 MED ORDER — IPRATROPIUM-ALBUTEROL 0.5-2.5 (3) MG/3ML IN SOLN
3.0000 mL | Freq: Once | RESPIRATORY_TRACT | Status: AC
  Administered 2017-11-05: 3 mL via RESPIRATORY_TRACT
  Filled 2017-11-05: qty 3

## 2017-11-05 MED ORDER — GUAIFENESIN-CODEINE 100-10 MG/5ML PO SOLN
5.0000 mL | Freq: Once | ORAL | Status: AC
  Administered 2017-11-05: 5 mL via ORAL
  Filled 2017-11-05: qty 5

## 2017-11-05 NOTE — ED Notes (Signed)
Breathing is completed. Pt given warm blanket

## 2017-11-05 NOTE — ED Notes (Signed)
Pt ambulated with O2 monitor. Pt's initial O2 97% on RA. With ambulation pt maintained O2 of 95-96% on RA.

## 2017-11-05 NOTE — ED Triage Notes (Signed)
Cough since Monday.  Today feels achey all over.  Patient with deep sounding cough, productive clear yellow

## 2017-11-05 NOTE — ED Provider Notes (Signed)
[facility] Emergency Department Provider Note  ____________________________________________  Time seen: Approximately 8:35 AM  I have reviewed the triage vital signs and the nursing notes.   HISTORY  Chief Complaint Cough    HPI Melanie [name] is a 32 y.o. female that presents emergency department for evaluation of  chills, body aches, nasal congestion, productive cough with yellow sputum for 3 days.  Cough and shortness of breath worsened last night. She has had pneumonia previously. She smokes less than a pack of cigarettes per day.  No asthma or allergies.  No nausea, vomiting, abdominal pain, diarrhea, constipation.   Past Medical History:  Diagnosis Date  . Anemia   . Obesity affecting pregnancy    Dermoid Cyst    Patient Active Problem List   Diagnosis Date Noted  . Nausea & vomiting 05/03/2015  . Labor and delivery indication for care or intervention 04/14/2015  . Absolute anemia 02/22/2015  . Obesity affecting pregnancy 02/22/2015  . Obesity in pregnancy, antepartum 01/11/2015  . First trimester screening 12/07/2014    Past Surgical History:  Procedure Laterality Date  . CHOLECYSTECTOMY    . OVARIAN CYST REMOVAL      Prior to Admission medications   Medication Sig Start Date End Date Taking? Authorizing Provider  albuterol (PROVENTIL HFA;VENTOLIN HFA) 108 (90 Base) MCG/ACT inhaler Inhale 2 puffs into the lungs every 6 (six) hours as needed for wheezing or shortness of breath. 11/05/17   Enid Derry, PA-C  aspirin EC 81 MG tablet Take 1 tablet (81 mg total) by mouth daily. 02/22/15   Lady Deutscher, MD  azithromycin (ZITHROMAX Z-PAK) 250 MG tablet Take 2 tablets (500 mg) on  Day 1,  followed by 1 tablet (250 mg) once daily on Days 2 through 5. 11/05/17   Enid Derry, PA-C  bismuth subsalicylate (PEPTO-BISMOL) 262 MG chewable tablet Chew 2 tablets (524 mg total) by mouth 3 (three) times daily. With Zantac for 3-5 days 02/04/17    Hassan Rowan, MD  ferrous fumarate (HEMOCYTE - 106 MG FE) 325 (106 FE) MG TABS tablet Take 1 tablet by mouth daily.    [provider]  folic acid (FOLVITE) 1 MG tablet Take 1 tablet (1 mg total) by mouth daily. 02/22/15   Lady Deutscher, MD  guaiFENesin-codeine [facility]) 100-10 MG/5ML syrup Take 5 mLs by mouth 3 (three) times daily as needed for up to 2 days for cough. 11/05/17 11/07/17  Enid Derry, PA-C  ketoconazole (NIZORAL) 2 % cream Apply 1 application topically 2 (two) times daily. 09/13/16   Hassan Rowan, MD  metoCLOPramide (REGLAN) 10 MG tablet Take 1 tablet (10 mg total) by mouth every 8 (eight) hours as needed for nausea or vomiting. Patient not taking: Reported on 01/09/2015 01/03/15 01/03/16  Myrna Blazer, MD  metroNIDAZOLE (FLAGYL) 500 MG tablet Take 1 tablet (500 mg total) by mouth 2 (two) times daily. 09/13/16   Hassan Rowan, MD  ondansetron (ZOFRAN ODT) 8 MG disintegrating tablet Take 1 tablet (8 mg total) by mouth every 8 (eight) hours as needed for nausea or vomiting. 09/13/16   Hassan Rowan, MD  ondansetron (ZOFRAN ODT) 8 MG disintegrating tablet Take 1 tablet (8 mg total) by mouth every 8 (eight) hours as needed for nausea or vomiting. 02/04/17   Hassan Rowan, MD  pyridOXINE (VITAMIN B-6) 50 MG tablet Take 1.5 tablets (75 mg total) by mouth daily. 01/03/15   Myrna Blazer, MD  ranitidine (ZANTAC) 150 MG capsule 1 tablet by mouth  3 times a day with Pepto-Bismol for the next 3-5 days 02/04/17   Hassan RowanWade, Eugene, MD    Allergies Patient has no known allergies.  Family History  Problem Relation Age of Onset  . Hypertension Father   . Diabetes Father     Social History Social History   Tobacco Use  . Smoking status: Current Every Day Smoker    Packs/day: 0.50    Types: Cigarettes  . Smokeless tobacco: Never Used  Substance Use Topics  . Alcohol use: Not Currently  . Drug use: No     Review of Systems  Eyes: No visual changes. No discharge. ENT:  Positive for congestion and rhinorrhea. Respiratory: Positive for cough and SOB. Gastrointestinal: No abdominal pain.  No nausea, no vomiting.  No diarrhea.  No constipation. Musculoskeletal: Positive for body aches Skin: Negative for rash, abrasions, lacerations, ecchymosis. Neurological: Negative for headaches.   ____________________________________________   PHYSICAL EXAM:  VITAL SIGNS: ED Triage Vitals [11/05/17 0827]  Enc Vitals Group     BP 128/77     Pulse Rate 88     Resp 20     Temp 99 F (37.2 C)     Temp Source Oral     SpO2 96 %     Weight 295 lb (133.8 kg)     Height 5\' 5"  (1.651 m)     Head Circumference      Peak Flow      Pain Score      Pain Loc      Pain Edu?      Excl. in GC?      Constitutional: Alert and oriented. Well appearing and in no acute distress. Eyes: Conjunctivae are normal. PERRL. EOMI. No discharge. Head: Atraumatic. ENT: No frontal and maxillary sinus tenderness.      Ears: Tympanic membranes pearly gray with good landmarks. No discharge.      Nose: Mild congestion/rhinnorhea.      Mouth/Throat: Mucous membranes are moist. Oropharynx non-erythematous. Tonsils not enlarged. No exudates. Uvula midline. Neck: No stridor.   Hematological/Lymphatic/Immunilogical: No cervical lymphadenopathy. Cardiovascular: Normal rate, regular rhythm.  Good peripheral circulation. Respiratory: No audible wheezing.  Normal respiratory effort without tachypnea or retractions. Lungs CTAB. Good air entry to the bases with no decreased or absent breath sounds. Gastrointestinal: Bowel sounds 4 quadrants. Soft and nontender to palpation. No guarding or rigidity. No palpable masses. No distention. Musculoskeletal: Full range of motion to all extremities. No gross deformities appreciated. Neurologic:  Normal speech and language. No gross focal neurologic deficits are appreciated.  Skin:  Skin is warm, dry and intact. No rash  noted.   ____________________________________________   LABS (all labs ordered are listed, but only abnormal results are displayed)  Labs Reviewed - No data to display ____________________________________________  EKG   ____________________________________________  RADIOLOGY Lexine BatonI, Kerry-Anne Mezo, personally viewed and evaluated these images (plain radiographs) as part of my medical decision making, as well as reviewing the written report by the radiologist.  Dg Chest 2 View  Result Date: 11/05/2017 CLINICAL DATA:  Cough for several days, malaise, smoking history EXAM: CHEST - 2 VIEW COMPARISON:  Chest x-ray of 01/01/2014 FINDINGS: There is parenchymal opacity in the medial left lung base probably in the left lower lobe posteriorly consistent with left lower lobe pneumonia. The right lung is clear. No pleural effusion is seen. Mediastinal and hilar contours are unremarkable. The heart is within normal limits in size. No bony abnormality is seen. IMPRESSION: Left lower lobe pneumonia.  Electronically Signed   By: Dwyane Dee M.D.   On: 11/05/2017 09:50    ____________________________________________    PROCEDURES  Procedure(s) performed:    Procedures    Medications  ipratropium-albuterol (DUONEB) 0.5-2.5 (3) MG/3ML nebulizer solution 3 mL (3 mLs Nebulization Given 11/05/17 0955)  guaiFENesin-codeine 100-10 MG/5ML solution 5 mL (5 mLs Oral Given 11/05/17 0959)  ipratropium-albuterol (DUONEB) 0.5-2.5 (3) MG/3ML nebulizer solution 3 mL (3 mLs Nebulization Given 11/05/17 1101)  cefTRIAXone (ROCEPHIN) injection 1 g (1 g Intramuscular Given 11/05/17 1058)  ibuprofen (ADVIL,MOTRIN) tablet 800 mg (800 mg Oral Given 11/05/17 1149)  predniSONE (DELTASONE) tablet 60 mg (60 mg Oral Given 11/05/17 1149)  albuterol (PROVENTIL) (2.5 MG/3ML) 0.083% nebulizer solution 2.5 mg (2.5 mg Nebulization Given 11/05/17 1149)     ____________________________________________   INITIAL IMPRESSION / ASSESSMENT AND  PLAN / ED COURSE  Pertinent labs & imaging results that were available during my care of the patient were reviewed by me and considered in my medical decision making (see chart for details).  Review of the [place] CSRS was performed in accordance of the [facility] prior to dispensing any controlled drugs.   Patient's diagnosis is consistent with left lower lobe pneumonia. Vital signs and exam are reassuring.  Vital signs and exam are reassuring.  Chest x-ray indicates left lower lobe pneumonia.  IM ceftriaxone was given.  Symptoms improved with DuoNeb treatments.  O2 did not drop below 95% with ambulation.  She was given ibuprofen for body aches.  Patient appears well and is staying well hydrated, and she is requesting to go home.  Patient feels comfortable going home. Patient will be discharged home with prescriptions for azithromycin, Robitussin, albuterol inhaler. Patient is to follow up with PCP as needed or otherwise directed. Patient is given ED precautions to return to the ED for any worsening or new symptoms.     ____________________________________________  FINAL CLINICAL IMPRESSION(S) / ED DIAGNOSES  Final diagnoses:  Community acquired pneumonia of left lower lobe of lung (HCC)      NEW MEDICATIONS STARTED DURING THIS VISIT:  ED Discharge Orders        Ordered    azithromycin (ZITHROMAX Z-PAK) 250 MG tablet     11/05/17 1225    guaiFENesin-codeine (ROBITUSSIN AC) 100-10 MG/5ML syrup  3 times daily PRN     11/05/17 1225    albuterol (PROVENTIL HFA;VENTOLIN HFA) 108 (90 Base) MCG/ACT inhaler  Every 6 hours PRN     11/05/17 1225          This chart was dictated using voice recognition software/Dragon. Despite best efforts to proofread, errors can occur which can change the meaning. Any change was purely unintentional.    Enid Derry, PA-C 11/05/17 1543    Emily Filbert, MD 11/05/17 [phone]

## 2018-04-22 ENCOUNTER — Other Ambulatory Visit: Payer: Self-pay

## 2018-04-22 ENCOUNTER — Encounter: Payer: Self-pay | Admitting: Emergency Medicine

## 2018-04-22 ENCOUNTER — Ambulatory Visit
Admission: EM | Admit: 2018-04-22 | Discharge: 2018-04-22 | Disposition: A | Discharge status: Home / Self Care | Payer: Commercial Managed Care - PPO | Attending: Emergency Medicine | Admitting: Emergency Medicine

## 2018-04-22 ENCOUNTER — Ambulatory Visit: Payer: Commercial Managed Care - PPO

## 2018-04-22 DIAGNOSIS — R69 Illness, unspecified: Secondary | ICD-10-CM

## 2018-04-22 DIAGNOSIS — J111 Influenza due to unidentified influenza virus with other respiratory manifestations: Secondary | ICD-10-CM

## 2018-04-22 DIAGNOSIS — R05 Cough: Secondary | ICD-10-CM

## 2018-04-22 DIAGNOSIS — R0602 Shortness of breath: Secondary | ICD-10-CM

## 2018-04-22 DIAGNOSIS — J0140 Acute pansinusitis, unspecified: Secondary | ICD-10-CM | POA: Insufficient documentation

## 2018-04-22 DIAGNOSIS — R509 Fever, unspecified: Secondary | ICD-10-CM | POA: Diagnosis present

## 2018-04-22 DIAGNOSIS — J9801 Acute bronchospasm: Secondary | ICD-10-CM | POA: Insufficient documentation

## 2018-04-22 LAB — RAPID INFLUENZA A&B ANTIGENS (ARMC ONLY)
INFLUENZA A (ARMC): NEGATIVE
INFLUENZA B (ARMC): NEGATIVE

## 2018-04-22 MED ORDER — AEROCHAMBER PLUS MISC: 2 refills | Status: DC

## 2018-04-22 MED ORDER — IBUPROFEN 800 MG PO TABS
800.0000 mg | ORAL_TABLET | Freq: Once | ORAL | Status: AC
  Administered 2018-04-22: 800 mg via ORAL

## 2018-04-22 MED ORDER — FLUTICASONE PROPIONATE 50 MCG/ACT NA SUSP: 2.0000 | Freq: Every day | NASAL | 0 refills | Status: DC

## 2018-04-22 MED ORDER — PREDNISONE 50 MG PO TABS: 50.0000 mg | ORAL_TABLET | Freq: Every day | ORAL | 0 refills | Status: AC

## 2018-04-22 MED ORDER — ACETAMINOPHEN 500 MG PO TABS
1000.0000 mg | ORAL_TABLET | Freq: Once | ORAL | Status: AC
  Administered 2018-04-22: 1000 mg via ORAL

## 2018-04-22 MED ORDER — IBUPROFEN 600 MG PO TABS: 600.0000 mg | ORAL_TABLET | Freq: Four times a day (QID) | ORAL | 0 refills | Status: DC | PRN

## 2018-04-22 MED ORDER — PREDNISONE 50 MG PO TABS
60.0000 mg | ORAL_TABLET | Freq: Once | ORAL | Status: AC
  Administered 2018-04-22: 60 mg via ORAL

## 2018-04-22 MED ORDER — DOXYCYCLINE HYCLATE 100 MG PO CAPS: 100.0000 mg | ORAL_CAPSULE | Freq: Two times a day (BID) | ORAL | 0 refills | Status: AC

## 2018-04-22 MED ORDER — ALBUTEROL SULFATE HFA 108 (90 BASE) MCG/ACT IN AERS: 1.0000 | INHALATION_SPRAY | RESPIRATORY_TRACT | 0 refills | Status: DC | PRN

## 2018-04-22 MED ORDER — IPRATROPIUM-ALBUTEROL 0.5-2.5 (3) MG/3ML IN SOLN
3.0000 mL | Freq: Once | RESPIRATORY_TRACT | Status: AC
  Administered 2018-04-22: 3 mL via RESPIRATORY_TRACT

## 2018-04-22 NOTE — Discharge Instructions (Addendum)
Your flu was negative, but you seem to have an influenza-like illness.  Do have sinus tenderness which is suggestive of sinusitis and while your x-ray is negative for pneumonia, given your shortness of breath and history of pneumonia I am going to treat you as if you have pneumonia.  Doxycycline will cover both sinus infection and pneumonia.  Prednisone will help with swelling in your sinuses and in your lungs.  1 to 2 puffs from your albuterol inhaler using a spacer every 4-6 hours as needed.  Flonase, saline nasal irrigation, Mucinex D maximum strength for the nasal congestion, Robitussin as needed for cough.

## 2018-04-22 NOTE — ED Provider Notes (Signed)
HPI  SUBJECTIVE:  Melanie [name] is a 32 y.o. female who presents with acute onset of malaise, fevers T-max 101, cough productive of yellowish clear sputum, headaches, body aches.  She reports chest tightness, shortness of breath, dyspnea on exertion.  Reports sore throat secondary to the cough.  Reports sinus pain and pressure, nasal congestion, rhinorrhea, postnasal drip, but no upper dental pain. 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  She did not get her flu shot this year.  No antibiotics in the past 3 months.  No antipyretic in the past 4 to 6 hours.  She tried hot water with lemon and honey which helps her sore throat.  Symptoms are worse with coughing.  She is a smoker, states that she has not smoked in 3 days.  No history of asthma emphysema, COPD.  She has a past medical history of pneumonia, symptoms are identical to that episode.  She states that she responded well to DuoNeb, prednisone, antibiotics.  No history of diabetes, hypertension, HIV, immunocompromise.  LMP: 2 weeks ago.  Denies the possibility of being pregnant.  PMD: at [facility].  Patient was seen in the [facility] ED in April, found to have a left lower lobe pneumonia.  She was given Rocephin, sent home with azithromycin, prednisone, Robitussin.  Past Medical History:  Diagnosis Date  . Anemia   . Obesity affecting pregnancy    Dermoid Cyst    Past Surgical History:  Procedure Laterality Date  . CHOLECYSTECTOMY    . OVARIAN CYST REMOVAL      Family History  Problem Relation Age of Onset  . Hypertension Father   . Diabetes Father     Social History   Tobacco Use  . Smoking status: Current Every Day Smoker    Packs/day: 0.50    Types: Cigarettes  . Smokeless tobacco: Never Used  Substance Use Topics  . Alcohol use: Not Currently  . Drug use: No    No current facility-administered medications for this encounter.   Current Outpatient Medications:  .  albuterol (PROVENTIL HFA;VENTOLIN HFA) 108 (90  Base) MCG/ACT inhaler, Inhale 1-2 puffs into the lungs every 4 (four) hours as needed for wheezing or shortness of breath., Disp: 1 Inhaler, Rfl: 0 .  doxycycline (VIBRAMYCIN) 100 MG capsule, Take 1 capsule (100 mg total) by mouth 2 (two) times daily for 7 days., Disp: 14 capsule, Rfl: 0 .  fluticasone (FLONASE) 50 MCG/ACT nasal spray, Place 2 sprays into both nostrils daily., Disp: 16 g, Rfl: 0 .  ibuprofen (ADVIL,MOTRIN) 600 MG tablet, Take 1 tablet (600 mg total) by mouth every 6 (six) hours as needed., Disp: 30 tablet, Rfl: 0 .  metroNIDAZOLE (FLAGYL) 500 MG tablet, Take 1 tablet (500 mg total) by mouth 2 (two) times daily., Disp: 20 tablet, Rfl: 0 .  predniSONE (DELTASONE) 50 MG tablet, Take 1 tablet (50 mg total) by mouth daily with breakfast for 5 days., Disp: 5 tablet, Rfl: 0 .  Spacer/Aero-Holding Chambers (AEROCHAMBER PLUS) inhaler, Use as instructed, Disp: 1 each, Rfl: 2  No Known Allergies   ROS  As noted in HPI.   Physical Exam  BP 115/74 (BP Location: Left Arm)   Pulse 72   Temp 98.2 F (36.8 C) (Oral)   Resp 18   Ht 5\' 5"  (1.651 m)   Wt 131.5 kg   LMP 04/08/2018   SpO2 100%   BMI 48.26 kg/m   Constitutional: Well developed, well nourished, appears ill Eyes:  EOMI, conjunctiva normal bilaterally  HENT: Normocephalic, atraumatic,mucus membranes moist.  Positive extensive nasal congestion.  Positive frontal and maxillary sinus tenderness.  Normal oropharynx. Neck: No cervical adenopathy Respiratory: Normal inspiratory effort, expiratory wheezing right upper lobe.  Fair air movement.  No chest wall tenderness Cardiovascular: Normal rate, no murmurs, rubs, gallops GI: nondistended skin: No rash, skin intact Musculoskeletal: no deformities Neurologic: Alert & oriented x 3, no focal neuro deficits Psychiatric: Speech and behavior appropriate   ED Course   Medications  acetaminophen (TYLENOL) tablet 1,000 mg (1,000 mg Oral Given 04/22/18 1158)  ibuprofen  (ADVIL,MOTRIN) tablet 800 mg (800 mg Oral Given 04/22/18 1158)  ipratropium-albuterol (DUONEB) 0.5-2.5 (3) MG/3ML nebulizer solution 3 mL (3 mLs Nebulization Given 04/22/18 1155)  predniSONE (DELTASONE) tablet 60 mg (60 mg Oral Given 04/22/18 1159)    Orders Placed This Encounter  Procedures  . Rapid Influenza A&B Antigens ([facility] only)    Standing Status:   Standing    Number of Occurrences:   1    Order Specific Question:   Patient immune status    Answer:   Normal  . DG Chest 2 View    Standing Status:   Standing    Number of Occurrences:   1    Order Specific Question:   Reason for Exam (SYMPTOM  OR DIAGNOSIS REQUIRED)    Answer:   shortness of breath  . Droplet precaution    Standing Status:   Standing    Number of Occurrences:   1    Results for orders placed or performed during the hospital encounter of 04/22/18 (from the past 24 hour(s))  Rapid Influenza A&B Antigens ([facility] only)     Status: None   Collection Time: 04/22/18 11:13 AM  Result Value Ref Range   Influenza A ([facility]) NEGATIVE NEGATIVE   Influenza B ([facility]) NEGATIVE NEGATIVE   Dg Chest 2 View  Result Date: 04/22/2018 CLINICAL DATA:  History of pneumonia 2-3 weeks ago with incomplete clearing. Recent onset of shortness of breath, productive cough, and fever. Onset of chest tightness today. Current smoker. EXAM: CHEST - 2 VIEW COMPARISON:  PA and lateral chest x-ray of November 05, 2017 FINDINGS: The lungs are adequately inflated and clear. The heart and pulmonary vascularity are normal. The mediastinum is normal in width. There is no pleural effusion. The bony thorax is unremarkable. IMPRESSION: There is no pneumonia nor other active cardiopulmonary disease. Electronically Signed   By: David  [place] M.D.   On: 04/22/2018 11:34    ED Clinical Impression  Influenza-like illness  Acute non-recurrent pansinusitis  Bronchospasm   ED Assessment/Plan  ER records reviewed.  As noted in HPI.   Presentation consistent with a  sinusitis versus flu versus pneumonia. Checking flu, chest x-ray.  Will give DuoNeb, prednisone 60 mg p.o. x1, ibuprofen 800 mg combined with 1 g of Tylenol.  Will reevaluate.  Is otherwise afebrile, satting well, and not tachycardic.  Reviewed imaging independently. No PNA. See radiology report for full details.  Rapid flu negative.  On reevaluation, patient states that she feels significantly better.  Lungs are clear.  She does have sinus tenderness and given her fever we will treat with doxycycline which will cover both the sinusitis and pneumonia.  We will refill her albuterol inhaler with a spacer, send home with 5 more days of prednisone, Robitussin, Flonase, Mucinex D, ibuprofen/Tylenol combination, work note for 2 days.  Discussed labs, imaging, MDM, treatment plan, and plan for follow-up with patient. Discussed sn/sx that should prompt return to  the ED. patient agrees with plan.   Meds ordered this encounter  Medications  . acetaminophen (TYLENOL) tablet 1,000 mg  . ibuprofen (ADVIL,MOTRIN) tablet 800 mg  . ipratropium-albuterol (DUONEB) 0.5-2.5 (3) MG/3ML nebulizer solution 3 mL  . predniSONE (DELTASONE) tablet 60 mg  . albuterol (PROVENTIL HFA;VENTOLIN HFA) 108 (90 Base) MCG/ACT inhaler    Sig: Inhale 1-2 puffs into the lungs every 4 (four) hours as needed for wheezing or shortness of breath.    Dispense:  1 Inhaler    Refill:  0  . fluticasone (FLONASE) 50 MCG/ACT nasal spray    Sig: Place 2 sprays into both nostrils daily.    Dispense:  16 g    Refill:  0  . predniSONE (DELTASONE) 50 MG tablet    Sig: Take 1 tablet (50 mg total) by mouth daily with breakfast for 5 days.    Dispense:  5 tablet    Refill:  0  . Spacer/Aero-Holding Chambers (AEROCHAMBER PLUS) inhaler    Sig: Use as instructed    Dispense:  1 each    Refill:  2  . ibuprofen (ADVIL,MOTRIN) 600 MG tablet    Sig: Take 1 tablet (600 mg total) by mouth every 6 (six) hours as needed.    Dispense:  30 tablet     Refill:  0  . doxycycline (VIBRAMYCIN) 100 MG capsule    Sig: Take 1 capsule (100 mg total) by mouth 2 (two) times daily for 7 days.    Dispense:  14 capsule    Refill:  0    *This clinic note was created using Scientist, clinical (histocompatibility and immunogenetics)Dragon dictation software. Therefore, there may be occasional mistakes despite careful proofreading.   ?    Domenick GongMortenson, Aryonna Gunnerson, MD 04/22/18 1243

## 2018-04-22 NOTE — ED Triage Notes (Signed)
Patient c/o cough, nasal drainage and shortness of breath x 2 days. Patient stated she has had a fever of 101.0 yesterday. Patient states she had pneumonia 2 months ago and she feels the same now.

## 2018-07-30 ENCOUNTER — Ambulatory Visit: Payer: Commercial Managed Care - PPO

## 2018-07-30 ENCOUNTER — Ambulatory Visit
Admission: EM | Admit: 2018-07-30 | Discharge: 2018-07-30 | Disposition: A | Discharge status: Home / Self Care | Payer: Commercial Managed Care - PPO | Attending: Family Medicine | Admitting: Family Medicine

## 2018-07-30 DIAGNOSIS — Z79899 Other long term (current) drug therapy: Secondary | ICD-10-CM | POA: Diagnosis not present

## 2018-07-30 DIAGNOSIS — F17210 Nicotine dependence, cigarettes, uncomplicated: Secondary | ICD-10-CM | POA: Insufficient documentation

## 2018-07-30 DIAGNOSIS — Z9049 Acquired absence of other specified parts of digestive tract: Secondary | ICD-10-CM | POA: Insufficient documentation

## 2018-07-30 DIAGNOSIS — Z791 Long term (current) use of non-steroidal anti-inflammatories (NSAID): Secondary | ICD-10-CM | POA: Diagnosis not present

## 2018-07-30 DIAGNOSIS — Z6841 Body Mass Index (BMI) 40.0 and over, adult: Secondary | ICD-10-CM | POA: Diagnosis not present

## 2018-07-30 DIAGNOSIS — R05 Cough: Secondary | ICD-10-CM | POA: Diagnosis present

## 2018-07-30 DIAGNOSIS — E669 Obesity, unspecified: Secondary | ICD-10-CM | POA: Insufficient documentation

## 2018-07-30 DIAGNOSIS — J40 Bronchitis, not specified as acute or chronic: Secondary | ICD-10-CM | POA: Insufficient documentation

## 2018-07-30 LAB — PREGNANCY, URINE: Preg Test, Ur: NEGATIVE

## 2018-07-30 MED ORDER — PREDNISONE 50 MG PO TABS: ORAL_TABLET | ORAL | 0 refills | Status: DC

## 2018-07-30 MED ORDER — BENZONATATE 100 MG PO CAPS: 100.0000 mg | ORAL_CAPSULE | Freq: Three times a day (TID) | ORAL | 0 refills | Status: DC | PRN

## 2018-07-30 MED ORDER — ALBUTEROL SULFATE (2.5 MG/3ML) 0.083% IN NEBU
2.5000 mg | INHALATION_SOLUTION | Freq: Once | RESPIRATORY_TRACT | Status: AC
  Administered 2018-07-30: 2.5 mg via RESPIRATORY_TRACT

## 2018-07-30 MED ORDER — DOXYCYCLINE HYCLATE 100 MG PO CAPS: 100.0000 mg | ORAL_CAPSULE | Freq: Two times a day (BID) | ORAL | 0 refills | Status: DC

## 2018-07-30 NOTE — Discharge Instructions (Signed)
Medications as prescribed.  Continue to use albuterol.  Take care  Dr. Adriana Simasook

## 2018-07-30 NOTE — ED Triage Notes (Signed)
Pt here for productive cough for 3 weeks. Did have a clear drainage but now it's a brown mucus. SOB and wheezing. Has been using inhaler with some relief.

## 2018-07-30 NOTE — ED Provider Notes (Signed)
[facility]    CSN: [id number] Arrival date & time: 07/30/18  1115  History   Chief Complaint Chief Complaint  Patient presents with  . Cough   HPI  32 year old female presents with cough.  3 to 4-week history of cough.  She reports associated shortness of breath.  Severe.  She states that her cough and shortness of breath are worsening.  Worsening over the past week.  She reports fever this morning, 101.  She states she has been taking numerous over-the-counter medications without resolution.  No known exacerbating factors.  She reports that her sputum is brown in color.  She has used an inhaler without resolution as well.  No other associated symptoms.  No other complaints.  PMH, Surgical Hx, Family Hx, Social History reviewed and updated as below.  Past Medical History:  Diagnosis Date  . Anemia   . Obesity affecting pregnancy    Dermoid Cyst    Patient Active Problem List   Diagnosis Date Noted  . Nausea & vomiting 05/03/2015  . Labor and delivery indication for care or intervention 04/14/2015  . Absolute anemia 02/22/2015  . Obesity affecting pregnancy 02/22/2015  . Obesity in pregnancy, antepartum 01/11/2015  . First trimester screening 12/07/2014    Past Surgical History:  Procedure Laterality Date  . CHOLECYSTECTOMY    . OVARIAN CYST REMOVAL      OB History    Gravida  2   Para  0   Term  0   Preterm  0   AB  1   Living  0     SAB      TAB  1   Ectopic  0   Multiple  0   Live Births           Obstetric Comments  Being see @ [place] due to "High Risk" pregnancy - weight related. [name], [name]          Home Medications    Prior to Admission medications   Medication Sig Start Date End Date Taking? Authorizing Provider  albuterol (PROVENTIL HFA;VENTOLIN HFA) 108 (90 Base) MCG/ACT inhaler Inhale 1-2 puffs into the lungs every 4 (four) hours as needed for wheezing or shortness of breath. 04/22/18  Yes Domenick GongMortenson,  Ashley, MD  Spacer/Aero-Holding Chambers (AEROCHAMBER PLUS) inhaler Use as instructed 04/22/18  Yes Domenick GongMortenson, Ashley, MD  benzonatate (TESSALON) 100 MG capsule Take 1 capsule (100 mg total) by mouth 3 (three) times daily as needed. 07/30/18   Tommie Samsook, Ifeoluwa Bartz G, DO  doxycycline (VIBRAMYCIN) 100 MG capsule Take 1 capsule (100 mg total) by mouth 2 (two) times daily. 07/30/18   Tommie Samsook, Eveleigh Crumpler G, DO  fluticasone (FLONASE) 50 MCG/ACT nasal spray Place 2 sprays into both nostrils daily. 04/22/18   Domenick GongMortenson, Ashley, MD  ibuprofen (ADVIL,MOTRIN) 600 MG tablet Take 1 tablet (600 mg total) by mouth every 6 (six) hours as needed. 04/22/18   Domenick GongMortenson, Ashley, MD  metroNIDAZOLE (FLAGYL) 500 MG tablet Take 1 tablet (500 mg total) by mouth 2 (two) times daily. 09/13/16   Hassan RowanWade, Eugene, MD  predniSONE (DELTASONE) 50 MG tablet 1 tablet daily x 5 days 07/30/18   Tommie Samsook, Channing Yeager G, DO    Family History Family History  Problem Relation Age of Onset  . Hypertension Father   . Diabetes Father     Social History Social History   Tobacco Use  . Smoking status: Current Every Day Smoker    Packs/day: 0.50    Types: Cigarettes  .  Smokeless tobacco: Never Used  Substance Use Topics  . Alcohol use: Not Currently  . Drug use: No     Allergies   Patient has no known allergies.   Review of Systems Review of Systems  Constitutional: Positive for fever.  Respiratory: Positive for cough and shortness of breath.    Physical Exam Triage Vital Signs ED Triage Vitals  Enc Vitals Group     BP 07/30/18 1125 120/62     Pulse Rate 07/30/18 1125 73     Resp 07/30/18 1125 18     Temp 07/30/18 1125 98.3 F (36.8 C)     Temp Source 07/30/18 1125 Oral     SpO2 07/30/18 1125 100 %     Weight 07/30/18 1127 280 lb (127 kg)     Height 07/30/18 1127 5\' 5"  (1.651 m)     Head Circumference --      Peak Flow --      Pain Score 07/30/18 1127 9     Pain Loc --      Pain Edu? --      Excl. in GC? --    Updated Vital Signs BP  120/62 (BP Location: Left Arm)   Pulse 73   Temp 98.3 F (36.8 C) (Oral)   Resp 18   Ht 5\' 5"  (1.651 m)   Wt 127 kg   LMP 06/04/2018   SpO2 100%   BMI 46.59 kg/m   Visual Acuity Right Eye Distance:   Left Eye Distance:   Bilateral Distance:    Right Eye Near:   Left Eye Near:    Bilateral Near:     Physical Exam Vitals signs and nursing note reviewed.  Constitutional:      Appearance: She is obese.     Comments: Mild increased work of breathing.  No apparent acute respiratory distress.  HENT:     Head: Normocephalic and atraumatic.     Right Ear: Tympanic membrane normal.     Left Ear: Tympanic membrane normal.     Nose: Nose normal.     Mouth/Throat:     Mouth: Mucous membranes are moist.     Pharynx: Posterior oropharyngeal erythema present.  Eyes:     General:        Right eye: No discharge.        Left eye: No discharge.     Conjunctiva/sclera: Conjunctivae normal.  Cardiovascular:     Rate and Rhythm: Normal rate and regular rhythm.  Pulmonary:     Comments: Increased work of breathing.  No appreciable adventitious breath sounds. Neurological:     Mental Status: She is alert.  Psychiatric:        Mood and Affect: Mood normal.        Behavior: Behavior normal.    UC Treatments / Results  Labs (all labs ordered are listed, but only abnormal results are displayed) Labs Reviewed  PREGNANCY, URINE    EKG None  Radiology Dg Chest 2 View  Result Date: 07/30/2018 CLINICAL DATA:  Cough and congestion for 4 weeks. EXAM: CHEST - 2 VIEW COMPARISON:  04/22/2018 FINDINGS: The cardiomediastinal silhouette is unremarkable. Mild peribronchial thickening is noted. There is no evidence of focal airspace disease, pulmonary edema, suspicious pulmonary nodule/mass, pleural effusion, or pneumothorax. No acute bony abnormalities are identified. IMPRESSION: Mild peribronchial thickening without focal pneumonia. Electronically Signed   By: Harmon Pier M.D.   On: 07/30/2018  12:26    Procedures Procedures (including critical care time)  Medications Ordered in UC Medications  albuterol (PROVENTIL) (2.5 MG/3ML) 0.083% nebulizer solution 2.5 mg (2.5 mg Nebulization Given 07/30/18 1203)    Initial Impression / Assessment and Plan / UC Course  I have reviewed the triage vital signs and the nursing notes.  Pertinent labs & imaging results that were available during my care of the patient were reviewed by me and considered in my medical decision making (see chart for details).    32 year old female presents with bronchitis.  Chest x-ray with peribronchial thickening but no pneumonia.  Given duration of symptoms as well as fever, I am covering her with doxycycline.  Prednisone as well.  Albuterol as needed.  Tessalon Perles for cough.  Final Clinical Impressions(s) / UC Diagnoses   Final diagnoses:  Bronchitis     Discharge Instructions     Medications as prescribed.  Continue to use albuterol.  Take care  Dr. Adriana Simasook     ED Prescriptions    Medication Sig Dispense Auth. Provider   predniSONE (DELTASONE) 50 MG tablet 1 tablet daily x 5 days 5 tablet Jaleigha Deane G, DO   doxycycline (VIBRAMYCIN) 100 MG capsule Take 1 capsule (100 mg total) by mouth 2 (two) times daily. 14 capsule Tenlee Wollin G, DO   benzonatate (TESSALON) 100 MG capsule Take 1 capsule (100 mg total) by mouth 3 (three) times daily as needed. 30 capsule Tommie Samsook, Konner Saiz G, DO     Controlled Substance Prescriptions [place] Controlled Substance Registry consulted? Not Applicable   Tommie SamsCook, Sindee Stucker G, DO 07/30/18 1239

## 2018-08-06 ENCOUNTER — Emergency Department: Payer: Commercial Managed Care - PPO

## 2018-08-06 ENCOUNTER — Emergency Department
Admission: EM | Admit: 2018-08-06 | Discharge: 2018-08-06 | Disposition: A | Discharge status: Home / Self Care | Payer: Commercial Managed Care - PPO | Attending: Emergency Medicine | Admitting: Emergency Medicine

## 2018-08-06 ENCOUNTER — Encounter: Payer: Self-pay | Admitting: Emergency Medicine

## 2018-08-06 ENCOUNTER — Other Ambulatory Visit: Payer: Self-pay

## 2018-08-06 DIAGNOSIS — Z79899 Other long term (current) drug therapy: Secondary | ICD-10-CM | POA: Insufficient documentation

## 2018-08-06 DIAGNOSIS — J40 Bronchitis, not specified as acute or chronic: Secondary | ICD-10-CM

## 2018-08-06 DIAGNOSIS — R0602 Shortness of breath: Secondary | ICD-10-CM | POA: Diagnosis present

## 2018-08-06 DIAGNOSIS — Z87891 Personal history of nicotine dependence: Secondary | ICD-10-CM | POA: Diagnosis not present

## 2018-08-06 DIAGNOSIS — Z720 Tobacco use: Secondary | ICD-10-CM

## 2018-08-06 LAB — BASIC METABOLIC PANEL
ANION GAP: 5 (ref 5–15)
BUN: 23 mg/dL — ABNORMAL HIGH (ref 6–20)
CALCIUM: 8.3 mg/dL — AB (ref 8.9–10.3)
CO2: 24 mmol/L (ref 22–32)
CREATININE: 1.18 mg/dL — AB (ref 0.44–1.00)
Chloride: 110 mmol/L (ref 98–111)
Glucose, Bld: 109 mg/dL — ABNORMAL HIGH (ref 70–99)
Potassium: 3.7 mmol/L (ref 3.5–5.1)
Sodium: 139 mmol/L (ref 135–145)

## 2018-08-06 LAB — INFLUENZA PANEL BY PCR (TYPE A & B)
Influenza A By PCR: NEGATIVE
Influenza B By PCR: NEGATIVE

## 2018-08-06 LAB — CBC
HCT: 39.3 % (ref 36.0–46.0)
Hemoglobin: 12.6 g/dL (ref 12.0–15.0)
MCH: 26.8 pg (ref 26.0–34.0)
MCHC: 32.1 g/dL (ref 30.0–36.0)
MCV: 83.4 fL (ref 80.0–100.0)
NRBC: 0 % (ref 0.0–0.2)
PLATELETS: 307 10*3/uL (ref 150–400)
RBC: 4.71 MIL/uL (ref 3.87–5.11)
RDW: 13.6 % (ref 11.5–15.5)
WBC: 15.5 10*3/uL — AB (ref 4.0–10.5)

## 2018-08-06 LAB — TROPONIN I: Troponin I: <0.03 ng/mL (ref <0.03)

## 2018-08-06 MED ORDER — ALBUTEROL SULFATE (2.5 MG/3ML) 0.083% IN NEBU
5.0000 mg | INHALATION_SOLUTION | Freq: Once | RESPIRATORY_TRACT | Status: AC
  Administered 2018-08-06: 5 mg via RESPIRATORY_TRACT
  Filled 2018-08-06: qty 6

## 2018-08-06 NOTE — Discharge Instructions (Addendum)
To the emergency room for any new or worrisome symptoms including increased cough, increased shortness of breath, worsening wheeze or if you feel worse in any way.  Take the albuterol inhaler as prescribed every 4 hours, and return to the ER if you feel worse

## 2018-08-06 NOTE — ED Provider Notes (Signed)
[facility] Emergency Department Provider Note  ____________________________________________   I have reviewed the triage vital signs and the nursing notes. Where available I have reviewed prior notes and, if possible and indicated, outside hospital notes.    HISTORY  Chief Complaint Shortness of Breath    HPI Melanie [name] is a 33 y.o. 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  She denies any chest pain or leg swelling. However she does have a persistent cough and occasional wheeze she does have a albuterol inhaler she sometimes uses at home and she is on steroids she is also already been given antibiotics.  Nonetheless, she still feels wheezy.  She denies any personal family history of PE DVT, she denies any recent travel or leg swelling  Cough is persistent, not positional nothing else makes it better or worse no other alleviating factors.  Past Medical History:  Diagnosis Date  . Anemia   . Obesity affecting pregnancy    Dermoid Cyst    Patient Active Problem List   Diagnosis Date Noted  . Nausea & vomiting 05/03/2015  . Labor and delivery indication for care or intervention 04/14/2015  . Absolute anemia 02/22/2015  . Obesity affecting pregnancy 02/22/2015  . Obesity in pregnancy, antepartum 01/11/2015  . First trimester screening 12/07/2014    Past Surgical History:  Procedure Laterality Date  . CHOLECYSTECTOMY    . OVARIAN CYST REMOVAL      Prior to Admission medications   Medication Sig Start Date End Date Taking? Authorizing Provider  albuterol (PROVENTIL HFA;VENTOLIN HFA) 108 (90 Base) MCG/ACT inhaler Inhale 1-2 puffs into the lungs every 4 (four) hours as needed for wheezing or shortness of breath. 04/22/18   [name], MD   benzonatate (TESSALON) 100 MG capsule Take 1 capsule (100 mg total) by mouth 3 (three) times daily as needed. 07/30/18   Tommie Sams, DO  doxycycline (VIBRAMYCIN) 100 MG capsule Take 1 capsule (100 mg total) by mouth 2 (two) times daily. 07/30/18   Tommie Sams, DO  fluticasone (FLONASE) 50 MCG/ACT nasal spray Place 2 sprays into both nostrils daily. 04/22/18   [name], MD  ibuprofen (ADVIL,MOTRIN) 600 MG tablet Take 1 tablet (600 mg total) by mouth every 6 (six) hours as needed. 04/22/18   [name], MD  metroNIDAZOLE (FLAGYL) 500 MG tablet Take 1 tablet (500 mg total) by mouth 2 (two) times daily. 09/13/16   Hassan Rowan, MD  predniSONE (DELTASONE) 50 MG tablet 1 tablet daily x 5 days 07/30/18   Tommie Sams, DO  Spacer/Aero-Holding Chambers (AEROCHAMBER PLUS) inhaler Use as instructed 04/22/18   [name], MD    Allergies Patient has no known allergies.  Family History  Problem Relation Age of Onset  . Hypertension Father   . Diabetes Father     Social History Social History   Tobacco Use  . Smoking status: Former Smoker    Packs/day: 0.50    Types: Cigarettes    Last attempt to quit: 07/31/2018    Years since quitting: 0.0  . Smokeless tobacco: Never Used  Substance Use Topics  . Alcohol use: Not Currently  . Drug use: No    Review of Systems Constitutional: No fever/chills Eyes: No visual changes. ENT: No sore throat. No stiff  neck no neck pain Cardiovascular: Denies chest pain. Respiratory: Positive occasionally productive chronic cough. Gastrointestinal:   no vomiting.  No diarrhea.  No constipation. Genitourinary: Negative for dysuria. Musculoskeletal: Negative lower extremity swelling Skin: Negative for rash. Neurological: Negative for severe headaches, focal weakness or numbness.   ____________________________________________   PHYSICAL EXAM:  VITAL SIGNS: ED Triage Vitals  Enc Vitals Group     BP 08/06/18 1624 120/66      Pulse Rate 08/06/18 1624 (!) 59     Resp 08/06/18 1624 (!) 22     Temp 08/06/18 1624 98.4 F (36.9 C)     Temp Source 08/06/18 1624 Oral     SpO2 08/06/18 1624 98 %     Weight 08/06/18 1625 280 lb (127 kg)     Height 08/06/18 1625 5\' 5"  (1.651 m)     Head Circumference --      Peak Flow --      Pain Score 08/06/18 1625 10     Pain Loc --      Pain Edu? --      Excl. in GC? --     Constitutional: Alert and oriented. Well appearing and in no acute distress. Eyes: Conjunctivae are normal Head: Atraumatic HEENT: No congestion/rhinnorhea. Mucous membranes are moist.  Oropharynx non-erythematous Neck:   Nontender with no meningismus, no masses, no stridor Cardiovascular: Normal rate, regular rhythm. Grossly normal heart sounds.  Good peripheral circulation. Respiratory: Normal respiratory effort.  No retractions.  Occasional very slight wheeze noted and occasional cough noted Abdominal: Soft and nontender. No distention. No guarding no rebound Back:  There is no focal tenderness or step off.  there is no midline tenderness there are no lesions noted. there is no CVA tenderness Musculoskeletal: No lower extremity tenderness, no upper extremity tenderness. No joint effusions, no DVT signs strong distal pulses no edema Neurologic:  Normal speech and language. No gross focal neurologic deficits are appreciated.  Skin:  Skin is warm, dry and intact. No rash noted. Psychiatric: Mood and affect are normal. Speech and behavior are normal.  ____________________________________________   LABS (all labs ordered are listed, but only abnormal results are displayed)  Labs Reviewed  BASIC METABOLIC PANEL - Abnormal; Notable for the following components:      Result Value   Glucose, Bld 109 (*)    BUN 23 (*)    Creatinine, Ser 1.18 (*)    Calcium 8.3 (*)    All other components within normal limits  CBC - Abnormal; Notable for the following components:   WBC 15.5 (*)    All other components  within normal limits  TROPONIN I  INFLUENZA PANEL BY PCR (TYPE A & B)  POC URINE PREG, ED    Pertinent labs  results that were available during my care of the patient were reviewed by me and considered in my medical decision making (see chart for details). ____________________________________________  EKG  I personally interpreted any EKGs ordered by me or triage Sinus rate 59, no acute ST elevation or depression normal axis unremarkable EKG ____________________________________________  RADIOLOGY  Pertinent labs & imaging results that were available during my care of the patient were reviewed by me and considered in my medical decision making (see chart for details). If possible, patient and/or family made aware of any abnormal findings.  Dg Chest 2 View  Result Date: 08/06/2018 CLINICAL DATA:  Cough EXAM: CHEST - 2 VIEW COMPARISON:  July 30, 2018 FINDINGS: No edema or consolidation. The heart  size and pulmonary vascularity are normal. No adenopathy. No bone lesions. IMPRESSION: No edema or consolidation. Electronically Signed   By: Bretta Bang III M.D.   On: 08/06/2018 16:49   ____________________________________________    PROCEDURES  Procedure(s) performed: None  Procedures  Critical Care performed: None  ____________________________________________   INITIAL IMPRESSION / ASSESSMENT AND PLAN / ED COURSE  Pertinent labs & imaging results that were available during my care of the patient were reviewed by me and considered in my medical decision making (see chart for details).  Here with cough, I did spend 5 minutes counseling smoking cessation and encouraging her to actually continue with her recently taken decision to quit smoking.  Patient does have a very slight wheeze we are giving her albuterol, her blood work is reassuring white count is chronically somewhat elevated and she is on steroids, nothing to suggest ACS and her troponin and EKG are reassuring  nothing to suggest CHF, and chest x-ray is clear.  She is already had antibiotics and steroids.  Not a whole lot I can do to make this go away for her and I do not think a hospitalization will help the other I think a work note certainly would help I am giving her a nebulizer treatment here to see if we can get her some help.  She is also requesting something for cough to help her sleep which I can provide.    ____________________________________________   FINAL CLINICAL IMPRESSION(S) / ED DIAGNOSES  Final diagnoses:  None      This chart was dictated using voice recognition software.  Despite best efforts to proofread,  errors can occur which can change meaning.      Jeanmarie Plant, MD 08/06/18 [phone]

## 2018-08-06 NOTE — ED Triage Notes (Signed)
Pt presents with cough x 3 weeks. States she went to UC twice last week; diagnosed with bronchitis. She states it is getting worse rather than better, and that she is having trouble breathing. Pt has been taking doxycycline and cough medicine as well as did a 5 day prednisone course. Pt alert & oriented, productive cough in triage.

## 2018-12-04 ENCOUNTER — Ambulatory Visit
Admission: EM | Admit: 2018-12-04 | Discharge: 2018-12-04 | Disposition: A | Discharge status: Home / Self Care | Payer: Commercial Managed Care - PPO | Attending: Family Medicine | Admitting: Family Medicine

## 2018-12-04 DIAGNOSIS — R197 Diarrhea, unspecified: Secondary | ICD-10-CM | POA: Diagnosis not present

## 2018-12-04 NOTE — ED Triage Notes (Signed)
Pt here for diarrhea for 3 days. Having discomfort in her lower abdomen. States everything she eats is running out of her. Did have her gallbladder removed. Did take Pepto bismol without relief.

## 2018-12-04 NOTE — ED Provider Notes (Signed)
[facility]    CSN: [id number] Arrival date & time: 12/04/18  1035     History   Chief Complaint Chief Complaint  Patient presents with  . Diarrhea    HPI Melanie [name] is a 33 y.o. female.   33 yo female with a c/o diarrhea for the last 3 days. States stools are pasty. Denies any fevers, chills, vomiting, abdominal pain, melena, hematochezia. States not sure if it was something she ate.   The history is provided by the patient.  Diarrhea    Past Medical History:  Diagnosis Date  . Anemia   . Obesity affecting pregnancy    Dermoid Cyst    Patient Active Problem List   Diagnosis Date Noted  . Nausea & vomiting 05/03/2015  . Labor and delivery indication for care or intervention 04/14/2015  . Absolute anemia 02/22/2015  . Obesity affecting pregnancy 02/22/2015  . Obesity in pregnancy, antepartum 01/11/2015  . First trimester screening 12/07/2014    Past Surgical History:  Procedure Laterality Date  . CHOLECYSTECTOMY    . OVARIAN CYST REMOVAL      OB History    Gravida  2   Para  0   Term  0   Preterm  0   AB  1   Living  0     SAB      TAB  1   Ectopic  0   Multiple  0   Live Births           Obstetric Comments  Being see @ [place] due to "High Risk" pregnancy - weight related. [name], [name]          Home Medications    Prior to Admission medications   Medication Sig Start Date End Date Taking? Authorizing Provider  albuterol (PROVENTIL HFA;VENTOLIN HFA) 108 (90 Base) MCG/ACT inhaler Inhale 1-2 puffs into the lungs every 4 (four) hours as needed for wheezing or shortness of breath. 04/22/18   Domenick GongMortenson, Ashley, MD  benzonatate (TESSALON) 100 MG capsule Take 1 capsule (100 mg total) by mouth 3 (three) times daily as needed. 07/30/18   Tommie Samsook, Jayce G, DO  doxycycline (VIBRAMYCIN) 100 MG capsule Take 1 capsule (100 mg total) by mouth 2 (two) times daily. 07/30/18   Tommie Samsook, Jayce G, DO  fluticasone (FLONASE)  50 MCG/ACT nasal spray Place 2 sprays into both nostrils daily. 04/22/18   Domenick GongMortenson, Ashley, MD  ibuprofen (ADVIL,MOTRIN) 600 MG tablet Take 1 tablet (600 mg total) by mouth every 6 (six) hours as needed. 04/22/18   Domenick GongMortenson, Ashley, MD  metroNIDAZOLE (FLAGYL) 500 MG tablet Take 1 tablet (500 mg total) by mouth 2 (two) times daily. 09/13/16   Hassan RowanWade, Eugene, MD  predniSONE (DELTASONE) 50 MG tablet 1 tablet daily x 5 days 07/30/18   Tommie Samsook, Jayce G, DO  Spacer/Aero-Holding Chambers (AEROCHAMBER PLUS) inhaler Use as instructed 04/22/18   Domenick GongMortenson, Ashley, MD    Family History Family History  Problem Relation Age of Onset  . Hypertension Father   . Diabetes Father     Social History Social History   Tobacco Use  . Smoking status: Former Smoker    Packs/day: 0.50    Types: Cigarettes    Last attempt to quit: 07/31/2018    Years since quitting: 0.3  . Smokeless tobacco: Never Used  Substance Use Topics  . Alcohol use: Not Currently  . Drug use: No     Allergies   Patient has no known allergies.  Review of Systems Review of Systems  Gastrointestinal: Positive for diarrhea.     Physical Exam Triage Vital Signs ED Triage Vitals  Enc Vitals Group     BP 12/04/18 1055 111/64     Pulse Rate 12/04/18 1055 68     Resp 12/04/18 1055 18     Temp 12/04/18 1055 98.4 F (36.9 C)     Temp Source 12/04/18 1055 Oral     SpO2 12/04/18 1055 100 %     Weight --      Height 12/04/18 1058 5\' 5"  (1.651 m)     Head Circumference --      Peak Flow --      Pain Score 12/04/18 1058 7     Pain Loc --      Pain Edu? --      Excl. in GC? --    No data found.  Updated Vital Signs BP 111/64 (BP Location: Right Arm)   Pulse 68   Temp 98.4 F (36.9 C) (Oral)   Resp 18   Ht 5\' 5"  (1.651 m)   LMP 11/12/2018 (Within Weeks)   SpO2 100%   BMI 46.59 kg/m   Visual Acuity Right Eye Distance:   Left Eye Distance:   Bilateral Distance:    Right Eye Near:   Left Eye Near:    Bilateral  Near:     Physical Exam Vitals signs and nursing note reviewed.  Constitutional:      General: She is not in acute distress.    Appearance: She is well-developed. She is not diaphoretic.  Abdominal:     General: Bowel sounds are normal. There is no distension.     Palpations: Abdomen is soft. There is no mass.     Tenderness: There is no abdominal tenderness. There is no right CVA tenderness, left CVA tenderness, guarding or rebound.     Hernia: No hernia is present.      UC Treatments / Results  Labs (all labs ordered are listed, but only abnormal results are displayed) Labs Reviewed - No data to display  EKG None  Radiology No results found.  Procedures Procedures (including critical care time)  Medications Ordered in UC Medications - No data to display  Initial Impression / Assessment and Plan / UC Course  I have reviewed the triage vital signs and the nursing notes.  Pertinent labs & imaging results that were available during my care of the patient were reviewed by me and considered in my medical decision making (see chart for details).      Final Clinical Impressions(s) / UC Diagnoses   Final diagnoses:  Diarrhea, unspecified type     Discharge Instructions     Imodium AD over the counter take as directed Rest, stay at home, fluids    ED Prescriptions    None     1. diagnosis reviewed with patient 2. Recommend supportive treatment as above  3. Follow-up prn if symptoms worsen or don't improve  Controlled Substance Prescriptions [place] Controlled Substance Registry consulted? Not Applicable   Payton Mccallum, MD 12/04/18 [phone]

## 2018-12-04 NOTE — Discharge Instructions (Addendum)
Imodium AD over the counter take as directed Rest, stay at home, fluids

## 2019-04-13 ENCOUNTER — Ambulatory Visit
Admission: EM | Admit: 2019-04-13 | Discharge: 2019-04-13 | Disposition: A | Discharge status: Home / Self Care | Payer: Commercial Managed Care - PPO | Attending: Family Medicine | Admitting: Family Medicine

## 2019-04-13 ENCOUNTER — Encounter: Payer: Self-pay | Admitting: Emergency Medicine

## 2019-04-13 ENCOUNTER — Other Ambulatory Visit: Payer: Self-pay

## 2019-04-13 DIAGNOSIS — A599 Trichomoniasis, unspecified: Secondary | ICD-10-CM

## 2019-04-13 DIAGNOSIS — R1030 Lower abdominal pain, unspecified: Secondary | ICD-10-CM | POA: Diagnosis not present

## 2019-04-13 LAB — URINALYSIS, COMPLETE (UACMP) WITH MICROSCOPIC
Bilirubin Urine: NEGATIVE
Glucose, UA: NEGATIVE mg/dL
Ketones, ur: NEGATIVE mg/dL
Nitrite: NEGATIVE
Protein, ur: 30 mg/dL — AB
Specific Gravity, Urine: >1.03 — ABNORMAL HIGH (ref 1.005–1.030)
pH: 5.5 (ref 5.0–8.0)

## 2019-04-13 LAB — PREGNANCY, URINE: Preg Test, Ur: NEGATIVE

## 2019-04-13 MED ORDER — METRONIDAZOLE 500 MG PO TABS: 500.0000 mg | ORAL_TABLET | Freq: Two times a day (BID) | ORAL | 0 refills | Status: DC

## 2019-04-13 MED ORDER — KETOROLAC TROMETHAMINE 10 MG PO TABS: 10.0000 mg | ORAL_TABLET | Freq: Three times a day (TID) | ORAL | 0 refills | Status: DC | PRN

## 2019-04-13 NOTE — Discharge Instructions (Signed)
Rest.  Medication as needed for pain.  If persists/worsens, go to the ER.  Take care  Dr. Lacinda Axon

## 2019-04-13 NOTE — ED Provider Notes (Signed)
[facility]    CSN: [id number] Arrival date & time: 04/13/19  1021  History   Chief Complaint Chief Complaint  Patient presents with  . Abdominal Pain  . Urinary Frequency   HPI   33 year old female presents with severe lower abdominal pain.  Patient reports that her pain started yesterday and has steadily worsened.  She reports that her pain is located diffusely throughout the lower abdomen.  He states that her pain is 10/10 in severity.  Described as stabbing.  She denies nausea vomiting.  She states that she has had a loose stool but states that is not watery.  No fever.  No chills.  She states that she has had urinary frequency and urgency for the past 2 weeks.  Last menstrual cycle was at the end of July.  Patient states that she does not believe that she is pregnant.  She does report that she has regular menses.  No known exacerbating or relieving factors.  No other reported symptoms.  No other complaints  PMH, Surgical Hx, Family Hx, Social History reviewed and updated as below.  PMH: Morbid obesity, Hx of ovarian cyst  Past Surgical History:  Procedure Laterality Date  . CHOLECYSTECTOMY    . OVARIAN CYST REMOVAL      OB History    Gravida  2   Para  0   Term  0   Preterm  0   AB  1   Living  0     SAB      TAB  1   Ectopic  0   Multiple  0   Live Births           Obstetric Comments  Being see @ [place] due to "High Risk" pregnancy - weight related. [name], [name]          Home Medications    Prior to Admission medications   Medication Sig Start Date End Date Taking? Authorizing Provider  albuterol (PROVENTIL HFA;VENTOLIN HFA) 108 (90 Base) MCG/ACT inhaler Inhale 1-2 puffs into the lungs every 4 (four) hours as needed for wheezing or shortness of breath. 04/22/18  Yes [name], MD  ketorolac (TORADOL) 10 MG tablet Take 1 tablet (10 mg total) by mouth every 8 (eight) hours as needed for moderate pain or severe pain.  04/13/19   Tommie Sams, DO  metroNIDAZOLE (FLAGYL) 500 MG tablet Take 1 tablet (500 mg total) by mouth 2 (two) times daily. 04/13/19   Tommie Sams, DO  fluticasone (FLONASE) 50 MCG/ACT nasal spray Place 2 sprays into both nostrils daily. 04/22/18 04/13/19  [name], MD    Family History Family History  Problem Relation Age of Onset  . Hypertension Father   . Diabetes Father     Social History Social History   Tobacco Use  . Smoking status: Current Some Day Smoker    Packs/day: 0.50    Types: Cigarettes    Last attempt to quit: 07/31/2018    Years since quitting: 0.7  . Smokeless tobacco: Never Used  Substance Use Topics  . Alcohol use: Not Currently  . Drug use: No     Allergies   Patient has no known allergies.   Review of Systems Review of Systems  Constitutional: Negative.   Gastrointestinal: Positive for abdominal pain.  Genitourinary: Positive for frequency and urgency.   Physical Exam Triage Vital Signs ED Triage Vitals  Enc Vitals Group     BP 04/13/19 1052 117/77  Pulse Rate 04/13/19 1052 62     Resp 04/13/19 1052 18     Temp 04/13/19 1052 98.1 F (36.7 C)     Temp Source 04/13/19 1052 Oral     SpO2 04/13/19 1052 100 %     Weight 04/13/19 1048 290 lb (131.5 kg)     Height 04/13/19 1048 5\' 5"  (1.651 m)     Head Circumference --      Peak Flow --      Pain Score 04/13/19 1048 10     Pain Loc --      Pain Edu? --      Excl. in [place]? --    Updated Vital Signs BP 117/77 (BP Location: Right Arm)   Pulse 62   Temp 98.1 F (36.7 C) (Oral)   Resp 18   Ht 5\' 5"  (1.651 m)   Wt 131.5 kg   LMP 02/15/2019   SpO2 100%   BMI 48.26 kg/m   Visual Acuity Right Eye Distance:   Left Eye Distance:   Bilateral Distance:    Right Eye Near:   Left Eye Near:    Bilateral Near:     Physical Exam Vitals signs and nursing note reviewed.  Constitutional:      Comments: Patient tearful and appears to be in pain.  Morbidly obese.  HENT:     Head:  Normocephalic and atraumatic.  Eyes:     General:        Right eye: No discharge.        Left eye: No discharge.     Conjunctiva/sclera: Conjunctivae normal.  Cardiovascular:     Rate and Rhythm: Normal rate and regular rhythm.     Heart sounds: No murmur.  Pulmonary:     Effort: Pulmonary effort is normal.     Breath sounds: Normal breath sounds. No wheezing, rhonchi or rales.  Abdominal:     General: There is no distension.     Palpations: Abdomen is soft.     Tenderness: There is no rebound.     Comments: Exquisitely tender to palpation throughout the lower abdomen.  Skin:    General: Skin is warm.     Findings: No rash.  Neurological:     Mental Status: She is alert and oriented to person, place, and time.  Psychiatric:        Mood and Affect: Mood normal.        Behavior: Behavior normal.      UC Treatments / Results  Labs (all labs ordered are listed, but only abnormal results are displayed) Labs Reviewed  URINALYSIS, COMPLETE (UACMP) WITH MICROSCOPIC - Abnormal; Notable for the following components:      Result Value   APPearance CLOUDY (*)    Specific Gravity, Urine >1.030 (*)    Hgb urine dipstick SMALL (*)    Protein, ur 30 (*)    Leukocytes,Ua SMALL (*)    Bacteria, UA FEW (*)    Trichomonas, UA PRESENT (*)    All other components within normal limits  PREGNANCY, URINE    EKG   Radiology No results found.  Procedures Procedures (including critical care time)  Medications Ordered in UC Medications - No data to display  Initial Impression / Assessment and Plan / UC Course  I have reviewed the triage vital signs and the nursing notes.  Pertinent labs & imaging results that were available during my care of the patient were reviewed by me and considered in my medical  decision making (see chart for details).    33 year old female presents for evaluation of lower abdominal pain.  Patient is diffusely tender throughout the lower abdomen.  She is  afebrile.  This is not consistent with appendicitis.  Urinalysis revealed trichomonas.  She gave a poor sample with 21-50 squamous epithelial cells.  I do not suspect that she has UTI based on her urinalysis demonstration of her urinary symptoms.  We discussed etiologies for her abdominal pain.  I do not suspect acute abdomen at this time.  Concern for ovarian cyst.  Discussed sending for ultrasound and patient elected to wait at this time given her prior history and the fact that she is quite uncomfortable.  Advised to go to the ER if she fails to improve or worsens.  Treating trichomonas with Flagyl.  Toradol as needed for pain.  Supportive care.  Final Clinical Impressions(s) / UC Diagnoses   Final diagnoses:  Lower abdominal pain  Trichomonas vaginalis infection     Discharge Instructions     Rest.  Medication as needed for pain.  If persists/worsens, go to the ER.  Take care  Dr. Adriana Simasook    ED Prescriptions    Medication Sig Dispense Auth. Provider   metroNIDAZOLE (FLAGYL) 500 MG tablet Take 1 tablet (500 mg total) by mouth 2 (two) times daily. 14 tablet Aditi Rovira G, DO   ketorolac (TORADOL) 10 MG tablet Take 1 tablet (10 mg total) by mouth every 8 (eight) hours as needed for moderate pain or severe pain. 20 tablet Tommie Samsook, Kamiyah Kindel G, DO     Controlled Substance Prescriptions [place] Controlled Substance Registry consulted? Not Applicable   Tommie SamsCook, Domenique Quest G, DO 04/13/19 1146

## 2019-04-13 NOTE — ED Triage Notes (Signed)
Patient c/o lower abdominal pain that started yesterday. She states the pain was worse this morning. Denies nausea and vomiting. Reports mild diarrhea that started this morning. Patient also c/o urinary frequency and urgency that started 2 weeks ago.

## 2019-07-11 ENCOUNTER — Ambulatory Visit: Payer: Self-pay

## 2019-07-12 ENCOUNTER — Ambulatory Visit: Payer: Self-pay | Admitting: Physician Assistant

## 2019-07-12 ENCOUNTER — Other Ambulatory Visit: Payer: Self-pay

## 2019-07-12 DIAGNOSIS — Z113 Encounter for screening for infections with a predominantly sexual mode of transmission: Secondary | ICD-10-CM

## 2019-07-12 LAB — WET PREP FOR TRICH, YEAST, CLUE
Trichomonas Exam: NEGATIVE
Yeast Exam: NEGATIVE

## 2019-07-13 ENCOUNTER — Encounter: Payer: Self-pay | Admitting: Physician Assistant

## 2019-07-13 NOTE — Progress Notes (Signed)
STI clinic/screening visit  Subjective:  Melanie [name] is a 33 y.o. female being seen today for an STI screening visit. The patient reports they do not have symptoms.  Patient reports that they do not desire a pregnancy in the next year.   They reported they are not interested in discussing contraception today.   Patient has the following medical conditions:   Patient Active Problem List   Diagnosis Date Noted  . Nausea & vomiting 05/03/2015  . Labor and delivery indication for care or intervention 04/14/2015  . Absolute anemia 02/22/2015  . Obesity affecting pregnancy 02/22/2015  . Obesity in pregnancy, antepartum 01/11/2015  . First trimester screening 12/07/2014     Chief Complaint  Patient presents with  . SEXUALLY TRANSMITTED DISEASE    HPI  Patient reports that she wants a screening today.  Denies any symptoms.  Denies chronic conditions and regular medications.  Reports that she had a cholecystectomy in 2016 and had surgery for ovarian cysts in 2013 and 2014.  See flowsheet for further details and programmatic requirements.    The following portions of the patient's history were reviewed and updated as appropriate: allergies, current medications, past medical history, past social history, past surgical history and problem list.  Objective:  There were no vitals filed for this visit.  Physical Exam Constitutional:      General: She is not in acute distress.    Appearance: Normal appearance. She is obese.  HENT:     Head: Normocephalic and atraumatic.     Comments: No nits, lice, or hair loss. No cervical, supraclavicular or axillary adenopathy.    Mouth/Throat:     Mouth: Mucous membranes are moist.     Pharynx: Oropharynx is clear. No oropharyngeal exudate or posterior oropharyngeal erythema.  Eyes:     Conjunctiva/sclera: Conjunctivae normal.  Neck:     Musculoskeletal: Neck supple. No muscular tenderness.  Pulmonary:     Effort: Pulmonary  effort is normal.  Abdominal:     Palpations: Abdomen is soft. There is no mass.     Tenderness: There is no abdominal tenderness. There is no guarding or rebound.  Genitourinary:    General: Normal vulva.     Rectum: Normal.     Comments: External genitalia/pubic area without nits, lice, edema, erythema, lesions and inguinal adenopathy. Vagina with normal mucosa and discharge, pH=45. Cervix without visible lesions. Uterus firm, mobile, nt, no masses, no CMT, no adnexal tenderness or fullness. Skin:    General: Skin is warm and dry.     Findings: No bruising, erythema, lesion or rash.  Neurological:     Mental Status: She is alert and oriented to person, place, and time.  Psychiatric:        Mood and Affect: Mood normal.        Behavior: Behavior normal.        Thought Content: Thought content normal.        Judgment: Judgment normal.       Assessment and Plan:  Melanie [name] is a 33 y.o. female presenting to the [facility] Department for STI screening  1. Screening for STD (sexually transmitted disease) Patient into clinic without symptoms. Rec condoms with all sex. Await test results.  Counseled that RN will call if needs to RTC for treatment once results are back. - WET PREP FOR [place], YEAST, [place] [facility] - Syphilis Serology, [facility]  No follow-ups on file.  No future appointments.  Jerene Dilling, PA

## 2019-12-07 ENCOUNTER — Ambulatory Visit
Admission: EM | Admit: 2019-12-07 | Discharge: 2019-12-07 | Disposition: A | Discharge status: Home / Self Care | Payer: Commercial Managed Care - PPO | Attending: Urgent Care | Admitting: Urgent Care

## 2019-12-07 ENCOUNTER — Ambulatory Visit (INDEPENDENT_AMBULATORY_CARE_PROVIDER_SITE_OTHER): Payer: Commercial Managed Care - PPO

## 2019-12-07 ENCOUNTER — Other Ambulatory Visit: Payer: Self-pay

## 2019-12-07 DIAGNOSIS — M7918 Myalgia, other site: Secondary | ICD-10-CM

## 2019-12-07 DIAGNOSIS — R0602 Shortness of breath: Secondary | ICD-10-CM

## 2019-12-07 LAB — CBC WITH DIFFERENTIAL/PLATELET
Abs Immature Granulocytes: 0.04 10*3/uL (ref 0.00–0.07)
Basophils Absolute: 0 10*3/uL (ref 0.0–0.1)
Basophils Relative: 0 %
Eosinophils Absolute: 0.1 10*3/uL (ref 0.0–0.5)
Eosinophils Relative: 1 %
HCT: 38.8 % (ref 36.0–46.0)
Hemoglobin: 12.6 g/dL (ref 12.0–15.0)
Immature Granulocytes: 0 %
Lymphocytes Relative: 31 %
Lymphs Abs: 3.7 10*3/uL (ref 0.7–4.0)
MCH: 26.7 pg (ref 26.0–34.0)
MCHC: 32.5 g/dL (ref 30.0–36.0)
MCV: 82.2 fL (ref 80.0–100.0)
Monocytes Absolute: 0.6 10*3/uL (ref 0.1–1.0)
Monocytes Relative: 5 %
Neutro Abs: 7.7 10*3/uL (ref 1.7–7.7)
Neutrophils Relative %: 63 %
Platelets: 309 10*3/uL (ref 150–400)
RBC: 4.72 MIL/uL (ref 3.87–5.11)
RDW: 13.5 % (ref 11.5–15.5)
WBC: 12.3 10*3/uL — ABNORMAL HIGH (ref 4.0–10.5)
nRBC: 0 % (ref 0.0–0.2)

## 2019-12-07 LAB — BASIC METABOLIC PANEL
Anion gap: 6 (ref 5–15)
BUN: 17 mg/dL (ref 6–20)
CO2: 22 mmol/L (ref 22–32)
Calcium: 8.6 mg/dL — ABNORMAL LOW (ref 8.9–10.3)
Chloride: 106 mmol/L (ref 98–111)
Creatinine, Ser: 0.86 mg/dL (ref 0.44–1.00)
GFR calc Af Amer: >60 mL/min (ref >60)
GFR calc non Af Amer: >60 mL/min (ref >60)
Glucose, Bld: 91 mg/dL (ref 70–99)
Potassium: 3.8 mmol/L (ref 3.5–5.1)
Sodium: 134 mmol/L — ABNORMAL LOW (ref 135–145)

## 2019-12-07 LAB — TROPONIN I (HIGH SENSITIVITY): Troponin I (High Sensitivity): <2 ng/L (ref <18)

## 2019-12-07 MED ORDER — KETOROLAC TROMETHAMINE 60 MG/2ML IM SOLN
60.0000 mg | Freq: Once | INTRAMUSCULAR | Status: AC
  Administered 2019-12-07: 13:00:00 60 mg via INTRAMUSCULAR

## 2019-12-07 MED ORDER — CYCLOBENZAPRINE HCL 10 MG PO TABS: 10.0000 mg | ORAL_TABLET | Freq: Two times a day (BID) | ORAL | 0 refills | Status: DC | PRN

## 2019-12-07 MED ORDER — ALBUTEROL SULFATE HFA 108 (90 BASE) MCG/ACT IN AERS: 1.0000 | INHALATION_SPRAY | RESPIRATORY_TRACT | 0 refills | Status: DC | PRN

## 2019-12-07 MED ORDER — KETOROLAC TROMETHAMINE 10 MG PO TABS: 10.0000 mg | ORAL_TABLET | Freq: Three times a day (TID) | ORAL | 0 refills | Status: DC | PRN

## 2019-12-07 NOTE — Discharge Instructions (Signed)
It was very nice seeing you today in clinic. Thank you for entrusting me with your care.  ° °Make arrangements to follow up with your regular doctor in 1 week for re-evaluation if not improving. If your symptoms/condition worsens, please seek follow up care either here or in the ER. Please remember, our [place] providers are "right here with you" when you need us.  ° °Again, it was my pleasure to take care of you today. Thank you for choosing our clinic. I hope that you start to feel better quickly.  ° °Anita Mcadory, MSN, APRN, FNP-C, CEN °Advanced Practice Provider °[facility] °

## 2019-12-07 NOTE — ED Triage Notes (Addendum)
Pt presents with c/o having pain in her left shoulder and shortness of breath. Pt denies any history of asthma but does have history of pna. Pt denies any current cough, fever/chills, n/v/d or other symptoms or any recent illness. Pt states she feels like she can't catch her breath. Pt also has pain with movement of her shoulder (moving taking BP was painful). Pt denies any recent heavy lifting or injury.

## 2019-12-08 NOTE — ED Provider Notes (Signed)
Melanie [name], Melanie [name]   Name: Arma Reining Perrone DOB: 28-May-1986 MRN: [id number] CSN: [id number] PCP: Patient, No Pcp Per  Arrival date and time:  12/07/19 1026  Chief Complaint:  Shortness of Breath  NOTE: Prior to seeing the patient today, I have reviewed the triage nursing documentation and vital signs. Clinical staff has updated patient's PMH/PSHx, current medication list, and drug allergies/intolerances to ensure comprehensive history available to assist in medical decision making.   History:   HPI: Melanie [name] is a 34 y.o. female who presents today with complaints of shortness of breath, shoulder pain, and subscapular pain upon waking this morning. Patient denies pain in her chest. Patient describes the pain as having a quality. Patient denies any blunt force trauma to her anterior or lateral chest wall. No recent forceful coughing or other URI symptoms. Patient is having any pain in her LEFT upper extremity that extends distally down the length of the arm. She denies associated nausea, vomiting, and diaphoresis. Patient does not have a history of gastrointestinal reflux. Pain is reproducible with movement, palpation, and deep inspiration. Patient indicates that the pain minimally relieved/improved by rest. Patient advising that she has not experienced similar episodes of pain like this in the past. Patient denies a past medical history significant for any known cardiac problems. Patient does not have a history of any sort of cardiac arrhythmias; no pacemaker or AICD. She notes that her family history is significant for HTN and CVD. She has never been diagnosed with a DVT or PE in the past. She is not on daily anticoagulation therapy. Patient does not take aspirin on a daily basis. Despite her symptoms, patient has not taken any over the counter interventions to help improve/relieve her reported symptoms at home. Patient presents tearful and anxious today. She states, "I tried to  go to work, but it got so bad I had to come in to be seen".   Past Medical History:  Diagnosis Date  . Anemia   . Obesity affecting pregnancy    Dermoid Cyst    Past Surgical History:  Procedure Laterality Date  . CHOLECYSTECTOMY    . OVARIAN CYST REMOVAL      Family History  Problem Relation Age of Onset  . Hypertension Father   . Diabetes Father   . Heart disease Father   . Ovarian cancer Maternal Grandmother   . Prostate cancer Paternal Grandfather     Social History   Tobacco Use  . Smoking status: Current Some Day Smoker    Packs/day: 0.50    Types: Cigarettes    Last attempt to quit: 07/31/2018    Years since quitting: 1.3  . Smokeless tobacco: Never Used  Substance Use Topics  . Alcohol use: Not Currently  . Drug use: No    Patient Active Problem List   Diagnosis Date Noted  . Nausea & vomiting 05/03/2015  . Absolute anemia 02/22/2015    Home Medications:    No outpatient medications have been marked as taking for the 12/07/19 encounter [facility] Encounter).    Allergies:   Patient has no known allergies.  Review of Systems (ROS):  Review of systems NEGATIVE unless otherwise noted in narrative H&P section.   Vital Signs: Today's Vitals   12/07/19 1110 12/07/19 1111 12/07/19 1113 12/07/19 1245  BP:   132/82   Pulse:   (!) 55   Resp:   19   Temp:   98.3 F (36.8 C)   TempSrc:   Oral  SpO2:   100%   Weight:  280 lb (127 kg)    Height:  5\' 5"  (1.651 m)    PainSc: 10-Worst pain ever   8     Physical Exam: Physical Exam  Constitutional: She is oriented to person, place, and time and well-developed, well-nourished, and in no distress.  HENT:  Head: Normocephalic and atraumatic.  Eyes: Pupils are equal, round, and reactive to light.  Cardiovascular: Normal rate, regular rhythm, normal heart sounds and intact distal pulses.  Pulmonary/Chest: Effort normal. Tachypnea noted. She has wheezes (mild expiratory).     She exhibits tenderness.  She exhibits no crepitus, no edema and no deformity.    Abdominal: Soft. Normal appearance and bowel sounds are normal. She exhibits no distension. There is no abdominal tenderness. There is no CVA tenderness.  Musculoskeletal:     Cervical back: Normal range of motion and neck supple.  Lymphadenopathy:    She has no cervical adenopathy.  Neurological: She is alert and oriented to person, place, and time. She has normal sensation and intact cranial nerves. Gait normal.  Skin: Skin is warm and dry. No rash noted. She is not diaphoretic.  Psychiatric: Memory, affect and judgment normal. Her mood appears anxious.  Nursing note and vitals reviewed.   Urgent Care Treatments / Results:   Orders Placed This Encounter  Procedures  . DG Chest 2 View  . Basic metabolic panel  . CBC with Differential  . EKG 12-Lead    LABS: PLEASE NOTE: all labs that were ordered this encounter are listed, however only abnormal results are displayed. Labs Reviewed  BASIC METABOLIC PANEL - Abnormal; Notable for the following components:      Result Value   Sodium 134 (*)    Calcium 8.6 (*)    All other components within normal limits  CBC WITH DIFFERENTIAL/PLATELET - Abnormal; Notable for the following components:   WBC 12.3 (*)    All other components within normal limits  TROPONIN I (HIGH SENSITIVITY)    URGENT CARE ECG REPORT Date: 12/07/2019 Time ECG obtained: 1213 PM Rate: 61 bpm Rhythm: NSR without ectopy Axis (leads I and aVF): normal Intervals: PR 158 ms. QTc 418 ms.  ST segment and T wave changes: No evidence of ST segment elevation or depression Comparison: Similar to previous tracing obtained on 08/05/2018.    RADIOLOGY: DG Chest 2 View  Result Date: 12/07/2019 CLINICAL DATA:  Shortness of breath, pleuritic chest pain EXAM: CHEST - 2 VIEW COMPARISON:  08/06/2018 FINDINGS: The heart size and mediastinal contours are within normal limits. No focal airspace consolidation, pleural  effusion, or pneumothorax. The visualized skeletal structures are unremarkable. IMPRESSION: No active cardiopulmonary disease. Electronically Signed   By: 10/05/2018 D.O.   On: 12/07/2019 11:54    PROCEDURES: Procedures  MEDICATIONS RECEIVED THIS VISIT: Medications  ketorolac (TORADOL) injection 60 mg (60 mg Intramuscular Given 12/07/19 1238)    PERTINENT CLINICAL COURSE NOTES/UPDATES:   Initial Impression / Assessment and Plan / Urgent Care Course:  Pertinent labs & imaging results that were available during my care of the patient were personally reviewed by me and considered in my medical decision making (see lab/imaging section of note for values and interpretations).  Melanie [name] is a 34 y.o. female who presents to [facility] today with complaints of Shortness of Breath  Patient is well appearing overall in clinic today. She does not appear to be in any acute distress. Presenting symptoms (see HPI) and exam  as documented above. Symptoms with acute onset today. (+) risk factors for ACS, including HTN and Body mass index is 46.59 kg/m. Patient tearful and anxious. Will pursue workup as follows:   EKG normal with no evidence of ST segment elevation or depression.    Mild leukocytosis with a WBC of 12.3 k/uL. H&H normal.    BMP and hs-TnI unremarkable.    CXR revealed no acute cardiopulmonary process; no evidence of peribronchial thickening, areas of consolidation, focal infiltrates, or PTX.   Discussed results in detail with patient. Suspect musculoskeletal chest wall pain. Treated in clinic with ketorolac 60 mg IM. Will sent home with oral ketorolac, cyclobenzaprine, and SABA (albuterol) MDI. Reviewed indications and side effects of medications, with emphasis of possible somnolence associated with SMR; advised no driving.  Current clinical condition warrants patient being out of work in order to recover from her current injury/illness. She was provided  with the appropriate documentation to provide to her place of employment that will allow for her to RTW on 12/10/2019 with no restrictions.   Discussed follow up with primary care physician in 1 week for re-evaluation. I have reviewed the follow up and strict return precautions for any new or worsening symptoms. Patient is aware of symptoms that would be deemed urgent/emergent, and would thus require further evaluation either here or in the emergency department. At the time of discharge, she verbalized understanding and consent with the discharge plan as it was reviewed with her. All questions were fielded by provider and/or clinic staff prior to patient discharge.    Final Clinical Impressions / Urgent Care Diagnoses:   Final diagnoses:  Musculoskeletal pain  SOB (shortness of breath)    New Prescriptions:  [place] Controlled Substance Registry consulted? Not Applicable  Meds ordered this encounter  Medications  . albuterol (VENTOLIN HFA) 108 (90 Base) MCG/ACT inhaler    Sig: Inhale 1-2 puffs into the lungs every 4 (four) hours as needed for wheezing or shortness of breath.    Dispense:  18 g    Refill:  0  . ketorolac (TORADOL) 10 MG tablet    Sig: Take 1 tablet (10 mg total) by mouth every 8 (eight) hours as needed.    Dispense:  21 tablet    Refill:  0  . cyclobenzaprine (FLEXERIL) 10 MG tablet    Sig: Take 1 tablet (10 mg total) by mouth 2 (two) times daily as needed for muscle spasms.    Dispense:  20 tablet    Refill:  0  . ketorolac (TORADOL) injection 60 mg    Recommended Follow up Care:  Patient encouraged to follow up with the following provider within the specified time frame, or sooner as dictated by the severity of her symptoms. As always, she was instructed that for any urgent/emergent care needs, she should seek care either here or in the emergency department for more immediate evaluation.  NOTE: This note was prepared using Lobbyist along with smaller  Company secretary. Despite my best ability to proofread, there is the potential that transcriptional errors may still occur from this process, and are completely unintentional.    Karen Kitchens, NP 12/08/19 [phone]

## 2020-09-28 ENCOUNTER — Other Ambulatory Visit: Payer: Self-pay

## 2020-09-28 ENCOUNTER — Emergency Department: Payer: Commercial Managed Care - PPO

## 2020-09-28 ENCOUNTER — Ambulatory Visit
Admission: EM | Admit: 2020-09-28 | Discharge: 2020-09-28 | Disposition: A | Discharge status: Home / Self Care | Payer: Commercial Managed Care - PPO | Attending: Family Medicine | Admitting: Family Medicine

## 2020-09-28 ENCOUNTER — Emergency Department
Admission: EM | Admit: 2020-09-28 | Discharge: 2020-09-28 | Disposition: A | Discharge status: Home / Self Care | Payer: Commercial Managed Care - PPO | Attending: Emergency Medicine | Admitting: Emergency Medicine

## 2020-09-28 ENCOUNTER — Encounter: Payer: Self-pay | Admitting: Emergency Medicine

## 2020-09-28 DIAGNOSIS — M549 Dorsalgia, unspecified: Secondary | ICD-10-CM

## 2020-09-28 DIAGNOSIS — Z79899 Other long term (current) drug therapy: Secondary | ICD-10-CM | POA: Insufficient documentation

## 2020-09-28 DIAGNOSIS — R0789 Other chest pain: Secondary | ICD-10-CM | POA: Insufficient documentation

## 2020-09-28 DIAGNOSIS — R200 Anesthesia of skin: Secondary | ICD-10-CM

## 2020-09-28 DIAGNOSIS — M546 Pain in thoracic spine: Secondary | ICD-10-CM | POA: Diagnosis not present

## 2020-09-28 DIAGNOSIS — F17210 Nicotine dependence, cigarettes, uncomplicated: Secondary | ICD-10-CM | POA: Insufficient documentation

## 2020-09-28 LAB — POC URINE PREG, ED: Preg Test, Ur: NEGATIVE

## 2020-09-28 LAB — BASIC METABOLIC PANEL
Anion gap: 9 (ref 5–15)
BUN: 17 mg/dL (ref 6–20)
CO2: 22 mmol/L (ref 22–32)
Calcium: 8.9 mg/dL (ref 8.9–10.3)
Chloride: 106 mmol/L (ref 98–111)
Creatinine, Ser: 0.92 mg/dL (ref 0.44–1.00)
GFR, Estimated: >60 mL/min (ref >60)
Glucose, Bld: 105 mg/dL — ABNORMAL HIGH (ref 70–99)
Potassium: 4.5 mmol/L (ref 3.5–5.1)
Sodium: 137 mmol/L (ref 135–145)

## 2020-09-28 LAB — CBC
HCT: 38.4 % (ref 36.0–46.0)
Hemoglobin: 12.5 g/dL (ref 12.0–15.0)
MCH: 26.9 pg (ref 26.0–34.0)
MCHC: 32.6 g/dL (ref 30.0–36.0)
MCV: 82.6 fL (ref 80.0–100.0)
Platelets: 313 10*3/uL (ref 150–400)
RBC: 4.65 MIL/uL (ref 3.87–5.11)
RDW: 13.3 % (ref 11.5–15.5)
WBC: 12.3 10*3/uL — ABNORMAL HIGH (ref 4.0–10.5)
nRBC: 0 % (ref 0.0–0.2)

## 2020-09-28 LAB — TROPONIN I (HIGH SENSITIVITY)
Troponin I (High Sensitivity): 3 ng/L (ref <18)
Troponin I (High Sensitivity): 4 ng/L (ref <18)

## 2020-09-28 MED ORDER — CYCLOBENZAPRINE HCL 10 MG PO TABS
10.0000 mg | ORAL_TABLET | Freq: Once | ORAL | Status: AC
  Administered 2020-09-28: 10 mg via ORAL
  Filled 2020-09-28: qty 1

## 2020-09-28 MED ORDER — IOHEXOL 350 MG/ML SOLN
100.0000 mL | Freq: Once | INTRAVENOUS | Status: AC | PRN
  Administered 2020-09-28: 100 mL via INTRAVENOUS

## 2020-09-28 MED ORDER — ONDANSETRON HCL 4 MG/2ML IJ SOLN
4.0000 mg | Freq: Once | INTRAMUSCULAR | Status: AC
  Administered 2020-09-28: 4 mg via INTRAVENOUS
  Filled 2020-09-28: qty 2

## 2020-09-28 MED ORDER — MORPHINE SULFATE (PF) 4 MG/ML IV SOLN
4.0000 mg | Freq: Once | INTRAVENOUS | Status: AC
Start: 2020-09-28 — End: 2020-09-28
  Administered 2020-09-28: 4 mg via INTRAVENOUS
  Filled 2020-09-28: qty 1

## 2020-09-28 MED ORDER — CYCLOBENZAPRINE HCL 5 MG PO TABS: 5.0000 mg | ORAL_TABLET | Freq: Three times a day (TID) | ORAL | 0 refills | Status: DC | PRN

## 2020-09-28 NOTE — ED Triage Notes (Signed)
Pt comes into the ED via EMS from [facility] with c/o 10/10 upper back pain this morning. Diaphoretic, SB in the 50's.  120/73

## 2020-09-28 NOTE — ED Triage Notes (Signed)
Patient complains of back pain that started around 430am this morning. Patient states that pain radiates down both arms with shortness of breath. States that pain radiates down in to her chest.

## 2020-09-28 NOTE — ED Triage Notes (Signed)
Pt here from [facility] via EMS with c/o 10/10 upper back pain radiating to left chest and down left arm, states this has never happened before, SB per EKG, pt states heart is "never this slow." Diaphoretic in triage, rate 50's. NAD.

## 2020-09-28 NOTE — ED Provider Notes (Signed)
[facility] Emergency Department Provider Note  Time seen: 10:10 AM  I have reviewed the triage vital signs and the nursing notes.   HISTORY  Chief Complaint Medical Clearance, Back Pain, and Chest Pain   HPI Melanie [name] is a 35 y.o. female with a past medical history of anemia, obesity, presents emergency department for upper back pain.  According to the patient around four thirty this morning she was awoken by pain in her upper back radiating to her chest and left arm per patient.  Describes the pain as sharp and severe 10/10 worse with deep inspiration movement or cough.  Denies any fever.  No vomiting or diarrhea.   Past Medical History:  Diagnosis Date  . Anemia   . Obesity affecting pregnancy    Dermoid Cyst    Patient Active Problem List   Diagnosis Date Noted  . Nausea & vomiting 05/03/2015  . Absolute anemia 02/22/2015    Past Surgical History:  Procedure Laterality Date  . CHOLECYSTECTOMY    . OVARIAN CYST REMOVAL      Prior to Admission medications   Medication Sig Start Date End Date Taking? Authorizing Provider  albuterol (VENTOLIN HFA) 108 (90 Base) MCG/ACT inhaler Inhale 1-2 puffs into the lungs every 4 (four) hours as needed for wheezing or shortness of breath. 12/07/19 09/28/20  Verlee Monte, NP  fluticasone (FLONASE) 50 MCG/ACT nasal spray Place 2 sprays into both nostrils daily. 04/22/18 04/13/19  [name], MD    No Known Allergies  Family History  Problem Relation Age of Onset  . Hypertension Father   . Diabetes Father   . Heart disease Father   . Ovarian cancer Maternal Grandmother   . Prostate cancer Paternal Grandfather     Social History Social History   Tobacco Use  . Smoking status: Current Some Day Smoker    Packs/day: 0.50    Types: Cigarettes    Last attempt to quit: 07/31/2018    Years since quitting: 2.1  . Smokeless tobacco: Never Used  Vaping Use  . Vaping Use: Never used   Substance Use Topics  . Alcohol use: Not Currently  . Drug use: No    Review of Systems Constitutional: Negative for fever. Cardiovascular: Back pain radiating into the chest. Respiratory: Negative for shortness of breath.  Occasional cough. Gastrointestinal: Negative for abdominal pain, vomiting Musculoskeletal: Negative for musculoskeletal complaints Neurological: Negative for headache All other ROS negative  ____________________________________________   PHYSICAL EXAM:  VITAL SIGNS: ED Triage Vitals  Enc Vitals Group     BP 09/28/20 0934 118/73     Pulse Rate 09/28/20 0934 (!) 52     Resp 09/28/20 0934 18     Temp 09/28/20 0934 98.2 F (36.8 C)     Temp Source 09/28/20 0934 Oral     SpO2 09/28/20 0934 100 %     Weight 09/28/20 0935 300 lb (136.1 kg)     Height 09/28/20 0935 5\' 5"  (1.651 m)     Head Circumference --      Peak Flow --      Pain Score 09/28/20 0935 10     Pain Loc --      Pain Edu? --      Excl. in GC? --     Constitutional: Alert and oriented. Well appearing and in no distress. Eyes: Normal exam ENT      Head: Normocephalic and atraumatic.      Mouth/Throat: Mucous membranes are moist.  Cardiovascular: Normal rate, regular rhythm. Respiratory: Normal respiratory effort without tachypnea nor retractions. Breath sounds are clear  Gastrointestinal: Soft and nontender. No distention.   Musculoskeletal: Nontender with normal range of motion in all extremities. Neurologic:  Normal speech and language. No gross focal neurologic deficits Skin:  Skin is warm, dry and intact.  Psychiatric: Mood and affect are normal.  ____________________________________________    EKG  EKG viewed and interpreted by myself shows sinus bradycardia at 55 bpm, narrow QRS, normal axis, normal intervals, no concerning ST changes.  ____________________________________________    RADIOLOGY  CTA negative.  ____________________________________________   INITIAL  IMPRESSION / ASSESSMENT AND PLAN / ED COURSE  Pertinent labs & imaging results that were available during my care of the patient were reviewed by me and considered in my medical decision making (see chart for details).   Patient presents emergency department for 10/10 sharp upper back pain radiating to the anterior chest and left arm.  States the pain is worse with deep inspiration cough or movement.  Denies any known cause such as heavy lifting twisting bending.  Denies any history of back pain previously at least not to this level per patient.  Given the patient's pleuritic nature of pain we will obtain labs including cardiac enzymes EKG and obtain a CTA of the chest with contrast to rule out pulmonary embolism.  Vitals are reassuring.  Patient's work-up is essentially negative/reassuring.  Labs are normal including troponin negative x2.  Chest x-ray is normal.  CTA is normal.  Given the patient's reassuring work-up highly suspect musculoskeletal discomfort.  I spoke to the patient regarding return precautions, PCP follow-up and muscle relaxer.  Patient agreeable to plan of care.  Melanie [name] was evaluated in Emergency Department on 09/28/2020 for the symptoms described in the history of present illness. She was evaluated in the context of the global COVID-19 pandemic, which necessitated consideration that the patient might be at risk for infection with the SARS-CoV-2 virus that causes COVID-19. Institutional protocols and algorithms that pertain to the evaluation of patients at risk for COVID-19 are in a state of rapid change based on information released by regulatory bodies including the CDC and federal and state organizations. These policies and algorithms were followed during the patient's care in the ED.  ____________________________________________   FINAL CLINICAL IMPRESSION(S) / ED DIAGNOSES  Back pain Posterior chest pain   Minna Antis, MD 09/28/20 1307

## 2020-09-28 NOTE — ED Provider Notes (Signed)
[facility]    CSN: [id number] Arrival date & time: 09/28/20  2229      History   Chief Complaint Chief Complaint  Patient presents with  . Back Pain   HPI 34 year old female presents with severe upper back pain and left arm numbness.  Started approximately 4:30 AM.  Patient is in severe pain and is in distress secondary to pain.  She is crying.  She localizes the pain to the upper thoracic spine.  She reports associated left arm numbness.  She also notes anterior chest pain. She states that the pain is so severe that is making her short of breath.  No known fall, trauma, injury.  Pain is 10/10 in severity.  No relieving factors.  Seems to be worse with activity as she is having difficulty getting onto the stretcher.  No fever.  No other complaints.  Past Medical History:  Diagnosis Date  . Anemia   . Obesity affecting pregnancy    Dermoid Cyst    Patient Active Problem List   Diagnosis Date Noted  . Nausea & vomiting 05/03/2015  . Absolute anemia 02/22/2015    Past Surgical History:  Procedure Laterality Date  . CHOLECYSTECTOMY    . OVARIAN CYST REMOVAL      OB History    Gravida  2   Para  0   Term  0   Preterm  0   AB  1   Living  0     SAB      IAB  1   Ectopic  0   Multiple  0   Live Births           Obstetric Comments  Being see @ [place] due to "High Risk" pregnancy - weight related. [name], [name]          Home Medications    Prior to Admission medications   Medication Sig Start Date End Date Taking? Authorizing Provider  albuterol (VENTOLIN HFA) 108 (90 Base) MCG/ACT inhaler Inhale 1-2 puffs into the lungs every 4 (four) hours as needed for wheezing or shortness of breath. 12/07/19 09/28/20  Verlee Monte, NP  fluticasone (FLONASE) 50 MCG/ACT nasal spray Place 2 sprays into both nostrils daily. 04/22/18 04/13/19  [name], MD    Family History Family History  Problem Relation Age of Onset  . Hypertension  Father   . Diabetes Father   . Heart disease Father   . Ovarian cancer Maternal Grandmother   . Prostate cancer Paternal Grandfather     Social History Social History   Tobacco Use  . Smoking status: Current Some Day Smoker    Packs/day: 0.50    Types: Cigarettes    Last attempt to quit: 07/31/2018    Years since quitting: 2.1  . Smokeless tobacco: Never Used  Vaping Use  . Vaping Use: Never used  Substance Use Topics  . Alcohol use: Not Currently  . Drug use: No     Allergies   Patient has no known allergies.   Review of Systems Review of Systems  Constitutional: Negative.   Cardiovascular: Positive for chest pain.  Musculoskeletal: Positive for back pain.  Neurological: Positive for numbness.   Physical Exam Triage Vital Signs ED Triage Vitals  Enc Vitals Group     BP --      Pulse --      Resp --      Temp --      Temp src --  SpO2 --      Weight 09/28/20 0821 279 lb 15.8 oz (127 kg)     Height 09/28/20 0821 5\' 5"  (1.651 m)     Head Circumference --      Peak Flow --      Pain Score 09/28/20 0820 10     Pain Loc --      Pain Edu? --      Excl. in GC? --    Updated Vital Signs BP (!) 137/102 (BP Location: Left Arm)   Pulse 89   Resp (!) 30   Ht 5\' 5"  (1.651 m)   Wt 127 kg   LMP 08/28/2020   SpO2 100%   BMI 46.59 kg/m   Visual Acuity Right Eye Distance:   Left Eye Distance:   Bilateral Distance:    Right Eye Near:   Left Eye Near:    Bilateral Near:     Physical Exam Vitals and nursing note reviewed.  Constitutional:      Appearance: She is obese.     Comments: Patient is in distress secondary to pain.   HENT:     Head: Normocephalic and atraumatic.  Eyes:     General:        Right eye: No discharge.        Left eye: No discharge.     Conjunctiva/sclera: Conjunctivae normal.  Cardiovascular:     Rate and Rhythm: Normal rate and regular rhythm.  Pulmonary:     Effort: Pulmonary effort is normal.     Breath sounds: Normal  breath sounds. No wheezing or rales.  Neurological:     Mental Status: She is alert.  Psychiatric:     Comments: Patient crying.  Anxious.     UC Treatments / Results  Labs (all labs ordered are listed, but only abnormal results are displayed) Labs Reviewed - No data to display  EKG Interpretation: Sinus bradycardia at the rate of 52.  Sinus arrhythmia noted.  Normal axis. No ST or T wave changes.  Radiology No results found.  Procedures Procedures (including critical care time)  Medications Ordered in UC Medications - No data to display  Initial Impression / Assessment and Plan / UC Course  I have reviewed the triage vital signs and the nursing notes.  Pertinent labs & imaging results that were available during my care of the patient were reviewed by me and considered in my medical decision making (see chart for details).    35 year old female presents with back pain, left arm numbness.  Radiation to the left anterior chest.  No evidence of ACS.  Patient is hemodynamically stable at this time.  Patient is in distress secondary to pain.  Given the severity of her pain, she needs further evaluation and management in the emergency department.  She is going via EMS.  Final Clinical Impressions(s) / UC Diagnoses   Final diagnoses:  Severe back pain  Left arm numbness   Discharge Instructions   None    ED Prescriptions    None     PDMP not reviewed this encounter.   08/30/2020, 36 09/28/20 [phone]

## 2020-09-28 NOTE — ED Notes (Signed)
Patient is being discharged from the Urgent Care and sent to the Emergency Department via EMS . Per Dr. Adriana Simas, patient is in need of higher level of care due to Severe Pain. Patient is aware and verbalizes understanding of plan of care.  Vitals:   09/28/20 0824  BP: (!) 137/102  Pulse: 89  Resp: (!) 30  SpO2: 100%

## 2020-11-06 ENCOUNTER — Ambulatory Visit
Admission: EM | Admit: 2020-11-06 | Discharge: 2020-11-06 | Disposition: A | Discharge status: Home / Self Care | Payer: Commercial Managed Care - PPO | Attending: Sports Medicine | Admitting: Sports Medicine

## 2020-11-06 ENCOUNTER — Encounter: Payer: Self-pay | Admitting: Emergency Medicine

## 2020-11-06 ENCOUNTER — Other Ambulatory Visit: Payer: Self-pay

## 2020-11-06 DIAGNOSIS — F17210 Nicotine dependence, cigarettes, uncomplicated: Secondary | ICD-10-CM | POA: Diagnosis not present

## 2020-11-06 DIAGNOSIS — R0602 Shortness of breath: Secondary | ICD-10-CM | POA: Diagnosis not present

## 2020-11-06 DIAGNOSIS — J40 Bronchitis, not specified as acute or chronic: Secondary | ICD-10-CM | POA: Diagnosis not present

## 2020-11-06 DIAGNOSIS — J069 Acute upper respiratory infection, unspecified: Secondary | ICD-10-CM | POA: Diagnosis not present

## 2020-11-06 DIAGNOSIS — Z20822 Contact with and (suspected) exposure to covid-19: Secondary | ICD-10-CM | POA: Diagnosis not present

## 2020-11-06 DIAGNOSIS — R059 Cough, unspecified: Secondary | ICD-10-CM | POA: Diagnosis not present

## 2020-11-06 MED ORDER — ALBUTEROL SULFATE HFA 108 (90 BASE) MCG/ACT IN AERS: 1.0000 | INHALATION_SPRAY | RESPIRATORY_TRACT | 0 refills | Status: DC | PRN

## 2020-11-06 MED ORDER — BENZONATATE 100 MG PO CAPS: 200.0000 mg | ORAL_CAPSULE | Freq: Three times a day (TID) | ORAL | 0 refills | Status: DC

## 2020-11-06 MED ORDER — PREDNISONE 10 MG (21) PO TBPK: ORAL_TABLET | Freq: Every day | ORAL | 0 refills | Status: DC

## 2020-11-06 MED ORDER — AZITHROMYCIN 250 MG PO TABS: 250.0000 mg | ORAL_TABLET | Freq: Every day | ORAL | 0 refills | Status: DC

## 2020-11-06 NOTE — Discharge Instructions (Addendum)
You have a cough with a slight wheeze.  I am going to go ahead and treat you for bronchitis.  I prescribed a Z-Pak, Tessalon Perles, a prednisone taper, and albuterol inhaler. Please see the educational handouts provided. I provided a work note for you. Plenty of rest, plenty of fluids, Tylenol or Motrin for any fever or discomfort. If you develop significant shortness of breath or any other symptoms that require higher level of care, please call 911 or go to the ER. You need to establish care with a primary care provider.  I hope you get to feeling better, Dr. Zachery Dauer

## 2020-11-06 NOTE — ED Triage Notes (Signed)
Pt c/o cough, shortness of breath, sputum production, nasal congestion, and subjective fever. Started about 3 days ago. She has had 2 covid vaccines.

## 2020-11-07 LAB — SARS CORONAVIRUS 2 (TAT 6-24 HRS): SARS Coronavirus 2: NEGATIVE

## 2020-11-08 ENCOUNTER — Ambulatory Visit: Payer: Self-pay | Admitting: *Deleted

## 2020-11-08 NOTE — Telephone Encounter (Signed)
Patient called c/o difficulty breathing and getting worse. Reports she was seen in urgent care 11/06/20. Patient started prescribed medications and using albuterol inhaler without relief. Patient reports coughing up yellow to green sputum. Pain in stomach from coughing and wheezing  And more at night . Provided emotional support and patient able to relax breathing but continues to report heart is racing. Instructed patient to go to ED for reassessment or call 911 due to no one to drive her to ED at this time. Care advise given. Patient agreed to call 911 to get reassessed in ED. Patient was calling to set up new patient appt. Unable to complete information at this time due to c/o symptoms. Please contact patient to set up new patient appt.   Reason for Disposition . [1] MODERATE difficulty breathing (e.g., speaks in phrases, SOB even at rest, pulse 100-120) AND [2] NEW-onset or WORSE than normal  Answer Assessment - Initial Assessment Questions 1. RESPIRATORY STATUS: "Describe your breathing?" (e.g., wheezing, shortness of breath, unable to speak, severe coughing)      Shortness of breath, wheezing 2. ONSET: "When did this breathing problem begin?"     friday 3. PATTERN "Does the difficult breathing come and go, or has it been constant since it started?"      Constant now 4. SEVERITY: "How bad is your breathing?" (e.g., mild, moderate, severe)    - MILD: No SOB at rest, mild SOB with walking, speaks normally in sentences, can lay down, no retractions, pulse < 100.    - MODERATE: SOB at rest, SOB with minimal exertion and prefers to sit, cannot lie down flat, speaks in phrases, mild retractions, audible wheezing, pulse 100-120.    - SEVERE: Very SOB at rest, speaks in single words, struggling to breathe, sitting hunched forward, retractions, pulse > 120      Moderate to severe 5. RECURRENT SYMPTOM: "Have you had difficulty breathing before?" If Yes, ask: "When was the last time?" and "What happened  that time?"      Yes. Had bronchitis 6. CARDIAC HISTORY: "Do you have any history of heart disease?" (e.g., heart attack, angina, bypass surgery, angioplasty)      na 7. LUNG HISTORY: "Do you have any history of lung disease?"  (e.g., pulmonary embolus, asthma, emphysema)     no 8. CAUSE: "What do you think is causing the breathing problem?"      bronchitis 9. OTHER SYMPTOMS: "Do you have any other symptoms? (e.g., dizziness, runny nose, cough, chest pain, fever)     Cough, pain 10. PREGNANCY: "Is there any chance you are pregnant?" "When was your last menstrual period?"       na 11. TRAVEL: "Have you traveled out of the country in the last month?" (e.g., travel history, exposures)       na  Protocols used: BREATHING DIFFICULTY-A-AH

## 2020-11-10 NOTE — ED Provider Notes (Signed)
[facility]    CSN: [id number] Arrival date & time: 11/06/20  [id number]      History   Chief Complaint Chief Complaint  Patient presents with  . Cough  . Shortness of Breath    HPI Melanie [name] is a 35 y.o. female.   Patient is a 35 year old female who presents for evaluation of 2 to 3 days of dry cough, chest tightness and feeling warm.  She also reports some nasal congestion and rhinorrhea.  No documented fevers.  No history of asthma.  No history of seasonal allergies.  No chest pain or shortness of breath.  No nausea vomiting diarrhea.  No urinary or abdominal symptoms.  She has been vaccinated against COVID x2 but no booster.  No flu shot.  No COVID exposure or COVID history.  She does not have a primary care provider.  She [place] at [employer].  No red flag signs or symptoms elicited on history.     Past Medical History:  Diagnosis Date  . Anemia   . Obesity affecting pregnancy    Dermoid Cyst    Patient Active Problem List   Diagnosis Date Noted  . Nausea & vomiting 05/03/2015  . Absolute anemia 02/22/2015    Past Surgical History:  Procedure Laterality Date  . CHOLECYSTECTOMY    . OVARIAN CYST REMOVAL      OB History    Gravida  2   Para  0   Term  0   Preterm  0   AB  1   Living  0     SAB      IAB  1   Ectopic  0   Multiple  0   Live Births           Obstetric Comments  Being see @ [place] due to "High Risk" pregnancy - weight related. [name], [name]          Home Medications    Prior to Admission medications   Medication Sig Start Date End Date Taking? Authorizing Provider  albuterol (VENTOLIN HFA) 108 (90 Base) MCG/ACT inhaler Inhale 1-2 puffs into the lungs every 4 (four) hours as needed for wheezing or shortness of breath. 11/06/20  Yes Delton SeeBarnes, Yatziry Deakins, MD  azithromycin (ZITHROMAX) 250 MG tablet Take 1 tablet (250 mg total) by mouth daily. Take first 2 tablets together, then 1 every day until  finished. 11/06/20  Yes Delton SeeBarnes, Loi Rennaker, MD  benzonatate (TESSALON) 100 MG capsule Take 2 capsules (200 mg total) by mouth every 8 (eight) hours. 11/06/20  Yes Delton SeeBarnes, Omere Marti, MD  predniSONE (STERAPRED UNI-PAK 21 TAB) 10 MG (21) TBPK tablet Take by mouth daily. Take 6 tabs by mouth daily  for 2 days, then 5 tabs for 2 days, then 4 tabs for 2 days, then 3 tabs for 2 days, 2 tabs for 2 days, then 1 tab by mouth daily for 2 days 11/06/20  Yes Delton SeeBarnes, Kisha Messman, MD  cyclobenzaprine (FLEXERIL) 5 MG tablet Take 1 tablet (5 mg total) by mouth 3 (three) times daily as needed for muscle spasms. 09/28/20   Minna AntisPaduchowski, Kevin, MD  fluticasone (FLONASE) 50 MCG/ACT nasal spray Place 2 sprays into both nostrils daily. 04/22/18 04/13/19  Domenick GongMortenson, Ashley, MD    Family History Family History  Problem Relation Age of Onset  . Hypertension Father   . Diabetes Father   . Heart disease Father   . Ovarian cancer Maternal Grandmother   . Prostate cancer Paternal Grandfather  Social History Social History   Tobacco Use  . Smoking status: Current Some Day Smoker    Packs/day: 0.50    Types: Cigarettes    Last attempt to quit: 07/31/2018    Years since quitting: 2.2  . Smokeless tobacco: Never Used  Vaping Use  . Vaping Use: Never used  Substance Use Topics  . Alcohol use: Not Currently  . Drug use: No     Allergies   Patient has no known allergies.   Review of Systems Review of Systems  Constitutional: Negative.  Negative for activity change, appetite change, chills, diaphoresis and fever.  HENT: Positive for congestion and rhinorrhea. Negative for ear discharge, ear pain, facial swelling, postnasal drip, sinus pressure, sinus pain and sore throat.   Eyes: Negative.  Negative for pain.  Respiratory: Positive for cough, chest tightness and wheezing. Negative for shortness of breath and stridor.   Cardiovascular: Negative for chest pain and palpitations.  Gastrointestinal: Negative for abdominal pain,  constipation, diarrhea, nausea and vomiting.  Genitourinary: Negative.  Negative for dysuria, flank pain, hematuria and urgency.  Musculoskeletal: Negative.  Negative for arthralgias, back pain and myalgias.  Skin: Negative.  Negative for color change, pallor, rash and wound.  Neurological: Negative for dizziness, seizures, syncope, light-headedness, numbness and headaches.  All other systems reviewed and are negative.    Physical Exam Triage Vital Signs ED Triage Vitals  Enc Vitals Group     BP 11/06/20 0839 109/60     Pulse Rate 11/06/20 0839 95     Resp 11/06/20 0839 (!) 22     Temp 11/06/20 0839 98.6 F (37 C)     Temp Source 11/06/20 0839 Oral     SpO2 11/06/20 0839 100 %     Weight 11/06/20 0837 (!) 300 lb 0.7 oz (136.1 kg)     Height 11/06/20 0837 5\' 5"  (1.651 m)     Head Circumference --      Peak Flow --      Pain Score 11/06/20 0837 10     Pain Loc --      Pain Edu? --      Excl. in GC? --    No data found.  Updated Vital Signs BP 109/60 (BP Location: Left Arm)   Pulse 95   Temp 98.6 F (37 C) (Oral)   Resp (!) 22   Ht 5\' 5"  (1.651 m)   Wt (!) 136.1 kg   LMP 11/02/2020   SpO2 100%   BMI 49.93 kg/m   Visual Acuity Right Eye Distance:   Left Eye Distance:   Bilateral Distance:    Right Eye Near:   Left Eye Near:    Bilateral Near:     Physical Exam Vitals and nursing note reviewed.  Constitutional:      General: She is not in acute distress.    Appearance: She is well-developed. She is obese. She is not ill-appearing, toxic-appearing or diaphoretic.  HENT:     Head: Normocephalic and atraumatic.     Mouth/Throat:     Mouth: Mucous membranes are moist.     Pharynx: Oropharynx is clear. No pharyngeal swelling or oropharyngeal exudate.  Eyes:     Extraocular Movements: Extraocular movements intact.     Pupils: Pupils are equal, round, and reactive to light.  Neck:     Thyroid: No thyromegaly.     Vascular: No hepatojugular reflux or JVD.      Trachea: No tracheal deviation.  Cardiovascular:  Rate and Rhythm: Normal rate and regular rhythm.     Pulses: Normal pulses.     Heart sounds: Normal heart sounds. No murmur heard. No friction rub. No gallop.   Pulmonary:     Effort: Pulmonary effort is normal. Tachypnea present. No respiratory distress.     Breath sounds: Examination of the right-upper field reveals wheezing. Examination of the left-upper field reveals wheezing. Examination of the right-middle field reveals wheezing. Examination of the left-middle field reveals wheezing. Examination of the right-lower field reveals wheezing. Examination of the left-lower field reveals wheezing. Wheezing present. No decreased breath sounds, rhonchi or rales.     Comments: Anxious.  Slight wheeze noted in all lung fields.  Coughing throughout the exam. Musculoskeletal:     Cervical back: Normal range of motion and neck supple.  Lymphadenopathy:     Cervical: Cervical adenopathy present.  Skin:    General: Skin is warm.     Capillary Refill: Capillary refill takes less than 2 seconds.     Coloration: Skin is not cyanotic.     Findings: No ecchymosis, erythema or rash.  Neurological:     General: No focal deficit present.     Mental Status: She is alert and oriented to person, place, and time.  Psychiatric:        Mood and Affect: Mood is anxious.        Speech: Speech normal.      UC Treatments / Results  Labs (all labs ordered are listed, but only abnormal results are displayed) Labs Reviewed  SARS CORONAVIRUS 2 (TAT 6-24 HRS)    EKG   Radiology No results found.  Procedures Procedures (including critical care time)  Medications Ordered in UC Medications - No data to display  Initial Impression / Assessment and Plan / UC Course  I have reviewed the triage vital signs and the nursing notes.  Pertinent labs & imaging results that were available during my care of the patient were reviewed by me and considered in my  medical decision making (see chart for details).  Clinical impression: Cough with chest tightness and a mild wheeze on examination.  We will treat her for bronchitis.  She is stating 100% on room air.  Treatment plan: 1.  The findings and treatment plan were discussed in detail with the patient.  Patient was in agreement. 2.  Given her wheeze I prescribed a prednisone taper and an albuterol inhaler.  I will also cover her for atypicals with a Z-Pak. 3.  Gave her a work note. 4.  Supportive care, over-the-counter meds as needed, plenty of rest, plenty of fluids, Tylenol or Motrin for any fever or discomfort. 5.  I advised her that she needs to establish care with a primary care provider.  She voiced verbal understanding. 6.  If she develops worsening symptoms she knows to go to the ER for higher level of care. 7.  Follow-up here as needed.    Final Clinical Impressions(s) / UC Diagnoses   Final diagnoses:  Cough  Bronchitis  Shortness of breath  Viral URI with cough     Discharge Instructions     You have a cough with a slight wheeze.  I am going to go ahead and treat you for bronchitis.  I prescribed a Z-Pak, Tessalon Perles, a prednisone taper, and albuterol inhaler. Please see the educational handouts provided. I provided a work note for you. Plenty of rest, plenty of fluids, Tylenol or Motrin for any fever or discomfort.  If you develop significant shortness of breath or any other symptoms that require higher level of care, please call 911 or go to the ER. You need to establish care with a primary care provider.  I hope you get the feeling better, Dr. Zachery Dauer    ED Prescriptions    Medication Sig Dispense Auth. Provider   albuterol (VENTOLIN HFA) 108 (90 Base) MCG/ACT inhaler Inhale 1-2 puffs into the lungs every 4 (four) hours as needed for wheezing or shortness of breath. 1 each Delton See, MD   benzonatate (TESSALON) 100 MG capsule Take 2 capsules (200 mg total) by  mouth every 8 (eight) hours. 30 capsule Delton See, MD   predniSONE (STERAPRED UNI-PAK 21 TAB) 10 MG (21) TBPK tablet Take by mouth daily. Take 6 tabs by mouth daily  for 2 days, then 5 tabs for 2 days, then 4 tabs for 2 days, then 3 tabs for 2 days, 2 tabs for 2 days, then 1 tab by mouth daily for 2 days 42 tablet Delton See, MD   azithromycin (ZITHROMAX) 250 MG tablet Take 1 tablet (250 mg total) by mouth daily. Take first 2 tablets together, then 1 every day until finished. 6 tablet Delton See, MD     PDMP not reviewed this encounter.   Delton See, MD 11/11/20 1336

## 2021-02-19 ENCOUNTER — Ambulatory Visit: Payer: Self-pay | Admitting: Family Medicine

## 2021-02-20 ENCOUNTER — Other Ambulatory Visit: Payer: Self-pay

## 2021-02-20 ENCOUNTER — Ambulatory Visit: Payer: Self-pay | Admitting: Physician Assistant

## 2021-02-20 DIAGNOSIS — A5901 Trichomonal vulvovaginitis: Secondary | ICD-10-CM

## 2021-02-20 DIAGNOSIS — Z113 Encounter for screening for infections with a predominantly sexual mode of transmission: Secondary | ICD-10-CM

## 2021-02-20 LAB — WET PREP FOR TRICH, YEAST, CLUE
Trichomonas Exam: POSITIVE — AB
Yeast Exam: NEGATIVE

## 2021-02-20 MED ORDER — METRONIDAZOLE 500 MG PO TABS
500.0000 mg | ORAL_TABLET | Freq: Two times a day (BID) | ORAL | 0 refills | Status: AC
Start: 2021-02-20 — End: 2021-02-27

## 2021-02-21 ENCOUNTER — Encounter: Payer: Self-pay | Admitting: Physician Assistant

## 2021-02-21 NOTE — Progress Notes (Signed)
[facility] Department STI clinic/screening visit  Subjective:  Melanie [name] is a 35 y.o. female being seen today for an STI screening visit. The patient reports they do not have symptoms.  Patient reports that they do not desire a pregnancy in the next year.   They reported they are not interested in discussing contraception today.  No LMP recorded.   Patient has the following medical conditions:   Patient Active Problem List   Diagnosis Date Noted   Nausea & vomiting 05/03/2015   Absolute anemia 02/22/2015    Chief Complaint  Patient presents with   SEXUALLY TRANSMITTED DISEASE    screening    HPI  Patient reports that she is not having any symptoms but would like a screening today.  Denies chronic conditions and regular medicines.  Reports that she has had a cholecystectomy and dermoid cyst removal from an ovary.  States last HIV test was in about 2016 and thinks that may be when she last had a pap but is not sure.  LMP was 02/15/2021 and is using condoms as BCM.   See flowsheet for further details and programmatic requirements.    The following portions of the patient's history were reviewed and updated as appropriate: allergies, current medications, past medical history, past social history, past surgical history and problem list.  Objective:  There were no vitals filed for this visit.  Physical Exam Constitutional:      General: She is not in acute distress.    Appearance: Normal appearance.  HENT:     Head: Normocephalic and atraumatic.     Comments: No nits,lice, or hair loss. No cervical, supraclavicular or axillary adenopathy.     Mouth/Throat:     Mouth: Mucous membranes are moist.     Pharynx: Oropharynx is clear. No oropharyngeal exudate or posterior oropharyngeal erythema.  Eyes:     Conjunctiva/sclera: Conjunctivae normal.  Pulmonary:     Effort: Pulmonary effort is normal.  Abdominal:     Palpations: Abdomen is soft. There is  no mass.     Tenderness: There is no abdominal tenderness. There is no guarding or rebound.  Genitourinary:    General: Normal vulva.     Rectum: Normal.     Comments: External genitalia/pubic area without nits, lice, edema, erythema, lesions and inguinal adenopathy. Vagina with normal mucosa and discharge. Cervix without visible lesions. Uterus firm, mobile, nt, no masses, no CMT, no adnexal tenderness or fullness.  Musculoskeletal:     Cervical back: Neck supple. No tenderness.  Skin:    General: Skin is warm and dry.     Findings: No bruising, erythema, lesion or rash.  Neurological:     Mental Status: She is alert and oriented to person, place, and time.  Psychiatric:        Mood and Affect: Mood normal.        Behavior: Behavior normal.        Thought Content: Thought content normal.        Judgment: Judgment normal.     Assessment and Plan:  Melanie [name] is a 35 y.o. female presenting to the [facility] Department for STI screening  1. Screening for STD (sexually transmitted disease) Patient into clinic without symptoms. Reviewed with patient wet mount results.  Rec condoms with all sex. Await test results.  Counseled that RN will call if needs to RTC for treatment once results are back.  - WET PREP FOR TRICH, YEAST, CLUE - Chlamydia/Gonorrhea  [facility] - [facility] - Syphilis Serology, [facility]  2. Trichomonal vulvovaginitis Treat Trich with Metronidazole 500 mg #14 1 po BID for 7 days with food, no EtOH for 24 hr before and until 72 hr after completing medicine. No sex for 10 days and until after partner completes treatment. Enc to use OTC antifungal cream if has itching during or just after antibiotic use.  - metroNIDAZOLE (FLAGYL) 500 MG tablet; Take 1 tablet (500 mg total) by mouth 2 (two) times daily for 7 days.  Dispense: 14 tablet; Refill: 0     No follow-ups on file.  No future appointments.  Matt Holmes,  PA

## 2021-02-23 NOTE — Progress Notes (Signed)
Chart reviewed by Pharmacist  Suzanne Walker PharmD, Contract Pharmacist at [facility]  

## 2021-02-26 LAB — HM HIV SCREENING LAB: HM HIV Screening: NEGATIVE

## 2021-08-04 DIAGNOSIS — J189 Pneumonia, unspecified organism: Secondary | ICD-10-CM

## 2021-08-04 HISTORY — DX: Pneumonia, unspecified organism: J18.9

## 2021-09-14 ENCOUNTER — Other Ambulatory Visit: Payer: Self-pay

## 2021-09-14 ENCOUNTER — Ambulatory Visit: Admission: EM | Admit: 2021-09-14 | Discharge: 2021-09-14 | Payer: Self-pay

## 2021-09-14 NOTE — ED Triage Notes (Signed)
Patient presents to Urgent Care with complaints of abdominal pain, increased urinary freq., and urgency x 2 days. She this morning she began having a sharp pain in lower abdomin that radiates to her umbilical area. She is concerned with possible UTI.   Denies fever.

## 2021-11-01 ENCOUNTER — Ambulatory Visit
Admission: EM | Admit: 2021-11-01 | Discharge: 2021-11-01 | Disposition: A | Discharge status: Home / Self Care | Payer: Self-pay | Attending: Physician Assistant | Admitting: Physician Assistant

## 2021-11-01 ENCOUNTER — Other Ambulatory Visit: Payer: Self-pay

## 2021-11-01 DIAGNOSIS — Z20822 Contact with and (suspected) exposure to covid-19: Secondary | ICD-10-CM | POA: Insufficient documentation

## 2021-11-01 DIAGNOSIS — R0602 Shortness of breath: Secondary | ICD-10-CM | POA: Insufficient documentation

## 2021-11-01 DIAGNOSIS — R051 Acute cough: Secondary | ICD-10-CM | POA: Insufficient documentation

## 2021-11-01 DIAGNOSIS — B349 Viral infection, unspecified: Secondary | ICD-10-CM | POA: Insufficient documentation

## 2021-11-01 DIAGNOSIS — R059 Cough, unspecified: Secondary | ICD-10-CM | POA: Insufficient documentation

## 2021-11-01 LAB — RESP PANEL BY RT-PCR (FLU A&B, COVID) ARPGX2
Influenza A by PCR: NEGATIVE
Influenza B by PCR: NEGATIVE
SARS Coronavirus 2 by RT PCR: NEGATIVE

## 2021-11-01 MED ORDER — ALBUTEROL SULFATE (2.5 MG/3ML) 0.083% IN NEBU
2.5000 mg | INHALATION_SOLUTION | Freq: Once | RESPIRATORY_TRACT | Status: AC
  Administered 2021-11-01: 2.5 mg via RESPIRATORY_TRACT

## 2021-11-01 MED ORDER — ALBUTEROL SULFATE HFA 108 (90 BASE) MCG/ACT IN AERS
1.0000 | INHALATION_SPRAY | Freq: Four times a day (QID) | RESPIRATORY_TRACT | 0 refills | Status: DC | PRN
Start: 2021-11-01 — End: 2022-06-11

## 2021-11-01 MED ORDER — BENZONATATE 200 MG PO CAPS: 200.0000 mg | ORAL_CAPSULE | Freq: Three times a day (TID) | ORAL | 0 refills | Status: DC | PRN

## 2021-11-01 MED ORDER — PREDNISONE 10 MG PO TABS: ORAL_TABLET | ORAL | 0 refills | Status: DC

## 2021-11-01 NOTE — Discharge Instructions (Signed)
-  You are negative for flu and COVID.  You likely have another virus.  I have sent prednisone to the pharmacy since you had a little wheezing.  I have also sent an inhaler and cough medicine.  If the medications are too expensive you can like at [facility] and get coupons.  Asked the pharmacist.  They can help you with that. ?- Increase rest and fluids. ?- You should be feeling better over the next week if symptoms worsen, please return to the ER. ?

## 2021-11-01 NOTE — ED Provider Notes (Signed)
?[facility] ? ? ? ?CSN: [id number] ?Arrival date & time: 11/01/21  1346 ? ? ?  ? ?History   ?Chief Complaint ?Chief Complaint  ?Patient presents with  ? Cough  ? Wheezing  ? ? ?HPI ?Melanie [name] is a 36 y.o. female presenting for feeling ill for the past 3 days.  Patient reports fatigue, cough, nasal congestion, wheezing and feeling short of breath.  She reports she has had bronchitis and wheezing in the past and is concerned for that.  She also reports that her daughter was recently diagnosed with strep and her mother diagnosed with tonsillitis.  Patient denies any associated sore throat.  She has not had any fevers or weakness.  She just wanted be tested for COVID and flu.  Has tried over-the-counter cough medicine.  Has also used her albuterol inhaler.  No other complaints. ? ?HPI ? ?Past Medical History:  ?Diagnosis Date  ? Anemia   ? Obesity affecting pregnancy   ? Dermoid Cyst  ? ? ?Patient Active Problem List  ? Diagnosis Date Noted  ? Nausea & vomiting 05/03/2015  ? Absolute anemia 02/22/2015  ? ? ?Past Surgical History:  ?Procedure Laterality Date  ? CHOLECYSTECTOMY    ? OVARIAN CYST REMOVAL    ? ? ?OB History   ? ? Gravida  ?2  ? Para  ?0  ? Term  ?0  ? Preterm  ?0  ? AB  ?1  ? Living  ?0  ?  ? ? SAB  ?   ? IAB  ?1  ? Ectopic  ?0  ? Multiple  ?0  ? Live Births  ?   ?   ?  ? Obstetric Comments  ?Being see @ [facility] due to "High Risk" pregnancy - weight related. [name], Daniel Nones S ?  ?  ? ?  ? ? ? ?Home Medications   ? ?Prior to Admission medications   ?Medication Sig Start Date End Date Taking? Authorizing Provider  ?albuterol (VENTOLIN HFA) 108 (90 Base) MCG/ACT inhaler Inhale 1-2 puffs into the lungs every 6 (six) hours as needed for wheezing or shortness of breath. 11/01/21  Yes Eusebio Friendly B, PA-C  ?benzonatate (TESSALON) 200 MG capsule Take 1 capsule (200 mg total) by mouth 3 (three) times daily as needed for cough. 11/01/21  Yes Eusebio Friendly B, PA-C  ?predniSONE (DELTASONE)  10 MG tablet Take 6 tabs p.o. on day 1 and decrease by 1 tablet daily until complete 11/01/21  Yes Eusebio Friendly B, PA-C  ?fluticasone (FLONASE) 50 MCG/ACT nasal spray Place 2 sprays into both nostrils daily. 04/22/18 04/13/19  [name], MD  ? ? ?Family History ?Family History  ?Problem Relation Age of Onset  ? Hypertension Father   ? Diabetes Father   ? Heart disease Father   ? Ovarian cancer Maternal Grandmother   ? Prostate cancer Paternal Grandfather   ? ? ?Social History ?Social History  ? ?Tobacco Use  ? Smoking status: Former  ?  Packs/day: 0.50  ?  Types: Cigarettes  ?  Quit date: 07/31/2018  ?  Years since quitting: 3.2  ? Smokeless tobacco: Never  ?Vaping Use  ? Vaping Use: Never used  ?Substance Use Topics  ? Alcohol use: Not Currently  ? Drug use: Yes  ?  Types: Marijuana  ? ? ? ?Allergies   ?Patient has no known allergies. ? ? ?Review of Systems ?Review of Systems  ?Constitutional:  Positive for fatigue. Negative for chills, diaphoresis and  fever.  ?HENT:  Positive for congestion and rhinorrhea. Negative for ear pain, sinus pressure, sinus pain and sore throat.   ?Respiratory:  Positive for cough, chest tightness and shortness of breath.   ?Cardiovascular:  Negative for chest pain.  ?Gastrointestinal:  Negative for abdominal pain, nausea and vomiting.  ?Musculoskeletal:  Negative for arthralgias and myalgias.  ?Skin:  Negative for rash.  ?Neurological:  Negative for weakness and headaches.  ?Hematological:  Negative for adenopathy.  ? ? ?Physical Exam ?Triage Vital Signs ?ED Triage Vitals  ?Enc Vitals Group  ?   BP   ?   Pulse   ?   Resp   ?   Temp   ?   Temp src   ?   SpO2   ?   Weight   ?   Height   ?   Head Circumference   ?   Peak Flow   ?   Pain Score   ?   Pain Loc   ?   Pain Edu?   ?   Excl. in GC?   ? ?No data found. ? ?Updated Vital Signs ?BP (!) 124/106 (BP Location: Left Arm)   Pulse 66   Temp 98.3 ?F (36.8 ?C) (Oral)   Resp 18   Ht 5\' 5"  (1.651 m)   Wt 300 lb (136.1 kg)   LMP  10/31/2021   SpO2 100%   BMI 49.92 kg/m?  ?   ? ?Physical Exam ?Vitals and nursing note reviewed.  ?Constitutional:   ?   General: She is not in acute distress. ?   Appearance: Normal appearance. She is obese. She is ill-appearing. She is not toxic-appearing.  ?HENT:  ?   Head: Normocephalic and atraumatic.  ?   Nose: Congestion present.  ?   Mouth/Throat:  ?   Mouth: Mucous membranes are moist.  ?   Pharynx: Oropharynx is clear.  ?Eyes:  ?   General: No scleral icterus.    ?   Right eye: No discharge.     ?   Left eye: No discharge.  ?   Conjunctiva/sclera: Conjunctivae normal.  ?Cardiovascular:  ?   Rate and Rhythm: Normal rate and regular rhythm.  ?   Heart sounds: Normal heart sounds.  ?Pulmonary:  ?   Effort: Pulmonary effort is normal. No respiratory distress.  ?   Breath sounds: Wheezing (few scattered wheezes which somewhat clear with cough) present.  ?Musculoskeletal:  ?   Cervical back: Neck supple.  ?Skin: ?   General: Skin is dry.  ?Neurological:  ?   General: No focal deficit present.  ?   Mental Status: She is alert. Mental status is at baseline.  ?   Motor: No weakness.  ?   Gait: Gait normal.  ?Psychiatric:     ?   Mood and Affect: Mood normal.     ?   Behavior: Behavior normal.     ?   Thought Content: Thought content normal.  ? ? ? ?UC Treatments / Results  ?Labs ?(all labs ordered are listed, but only abnormal results are displayed) ?Labs Reviewed  ?RESP PANEL BY RT-PCR (FLU A&B, COVID) ARPGX2  ? ? ?EKG ? ? ?Radiology ?No results found. ? ?Procedures ?Procedures (including critical care time) ? ?Medications Ordered in UC ?Medications  ?albuterol (PROVENTIL) (2.5 MG/3ML) 0.083% nebulizer solution 2.5 mg (2.5 mg Nebulization Given 11/01/21 1526)  ? ? ?Initial Impression / Assessment and Plan / UC Course  ?I have reviewed the  triage vital signs and the nursing notes. ? ?Pertinent labs & imaging results that were available during my care of the patient were reviewed by me and considered in my medical  decision making (see chart for details). ? ?36 year old female presenting for 3-day history of cough which is productive of yellowish-green sputum, congestion, shortness of breath and wheezing.  No fever.  Has been around her daughter and mother who are both sick.  They have had sore throats but she does not have a sore throat.  Patient is afebrile.  She is ill-appearing but nontoxic.  She does have nasal congestion without drainage there are a couple wheezes scattered which mostly clear with cough.  She is in no respiratory distress and oxygen is on a percent.  Respiratory panel performed which is negative for flu and COVID.  Discussed result with patient.  Offered her a breathing treatment.  She agreed.  After albuterol treatment patient reported that it helped her a lot and she is breathing better.  I sent in a new prescription for albuterol inhaler because her only was expired as well as prednisone and benzonatate.  Patient likely has a viral illness and may have wheezy bronchitis.  Encouraged her to increase fluids.  Reviewed following up for fever or worsening symptoms.  ED precautions reviewed.  Work note given. ? ? ?Final Clinical Impressions(s) / UC Diagnoses  ? ?Final diagnoses:  ?Viral illness  ?Acute cough  ?Shortness of breath  ? ? ? ?Discharge Instructions   ? ?  ?-You are negative for flu and COVID.  You likely have another virus.  I have sent prednisone to the pharmacy since you had a little wheezing.  I have also sent an inhaler and cough medicine.  If the medications are too expensive you can like at [facility] and get coupons.  Asked the pharmacist.  They can help you with that. ?- Increase rest and fluids. ?- You should be feeling better over the next week if symptoms worsen, please return to the ER. ? ? ? ? ?ED Prescriptions   ? ? Medication Sig Dispense Auth. Provider  ? predniSONE (DELTASONE) 10 MG tablet Take 6 tabs p.o. on day 1 and decrease by 1 tablet daily until complete 21 tablet Eusebio Friendly B, PA-C  ? albuterol (VENTOLIN HFA) 108 (90 Base) MCG/ACT inhaler Inhale 1-2 puffs into the lungs every 6 (six) hours as needed for wheezing or shortness of breath. 1 g Eusebio Friendly B, PA-C  ? benzonatate

## 2021-11-01 NOTE — ED Triage Notes (Signed)
Pt c/o cough, wheezing, SOB x2-3days ? ?Pt has been using her inhaler and states that the coughing continues even when taking it.  ? ?Pt was around mother and daughter who had tonsillitis and Bronchitis.  ? ?Pt asks for a covid and flu test. ?

## 2022-03-01 IMAGING — CR DG CHEST 2V
2 series · 2 of 2 positions shown · non-contrast
Comparison: 08/06/2018

CLINICAL DATA: Shortness of breath, pleuritic chest pain

EXAM:
CHEST - 2 VIEW

[chest pa]
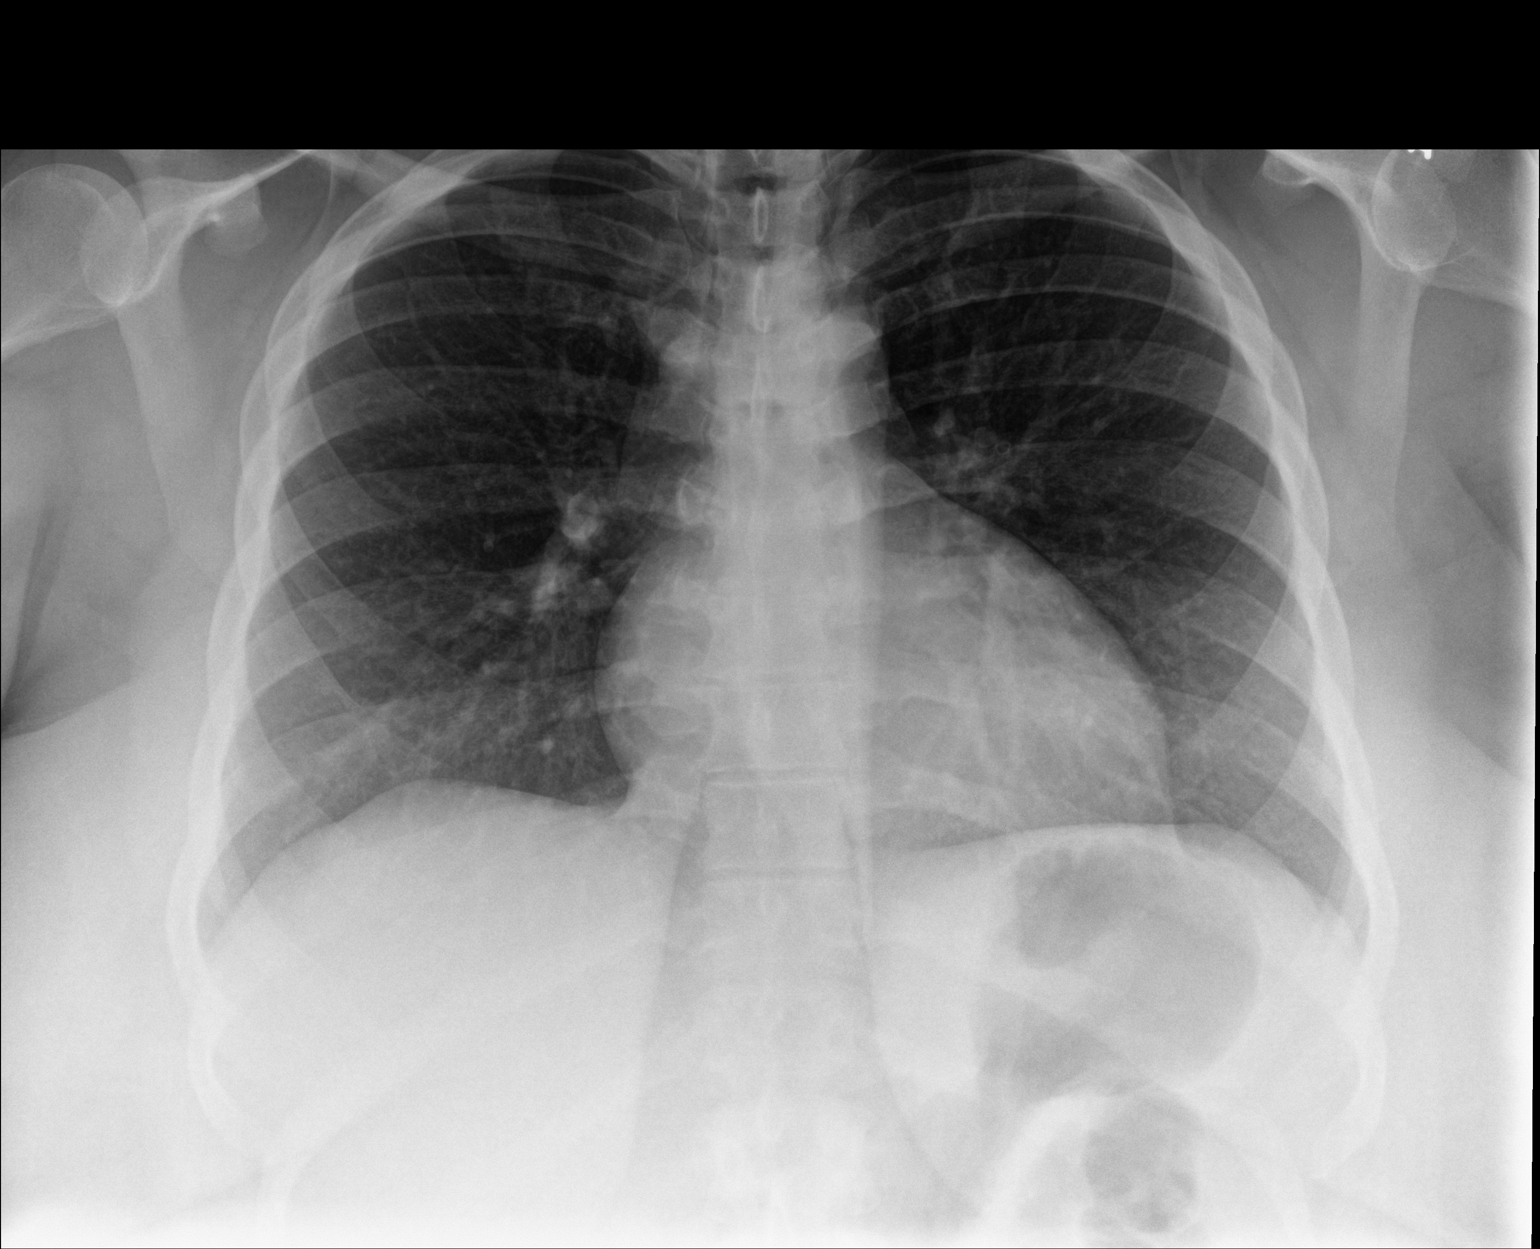

[chest lat]
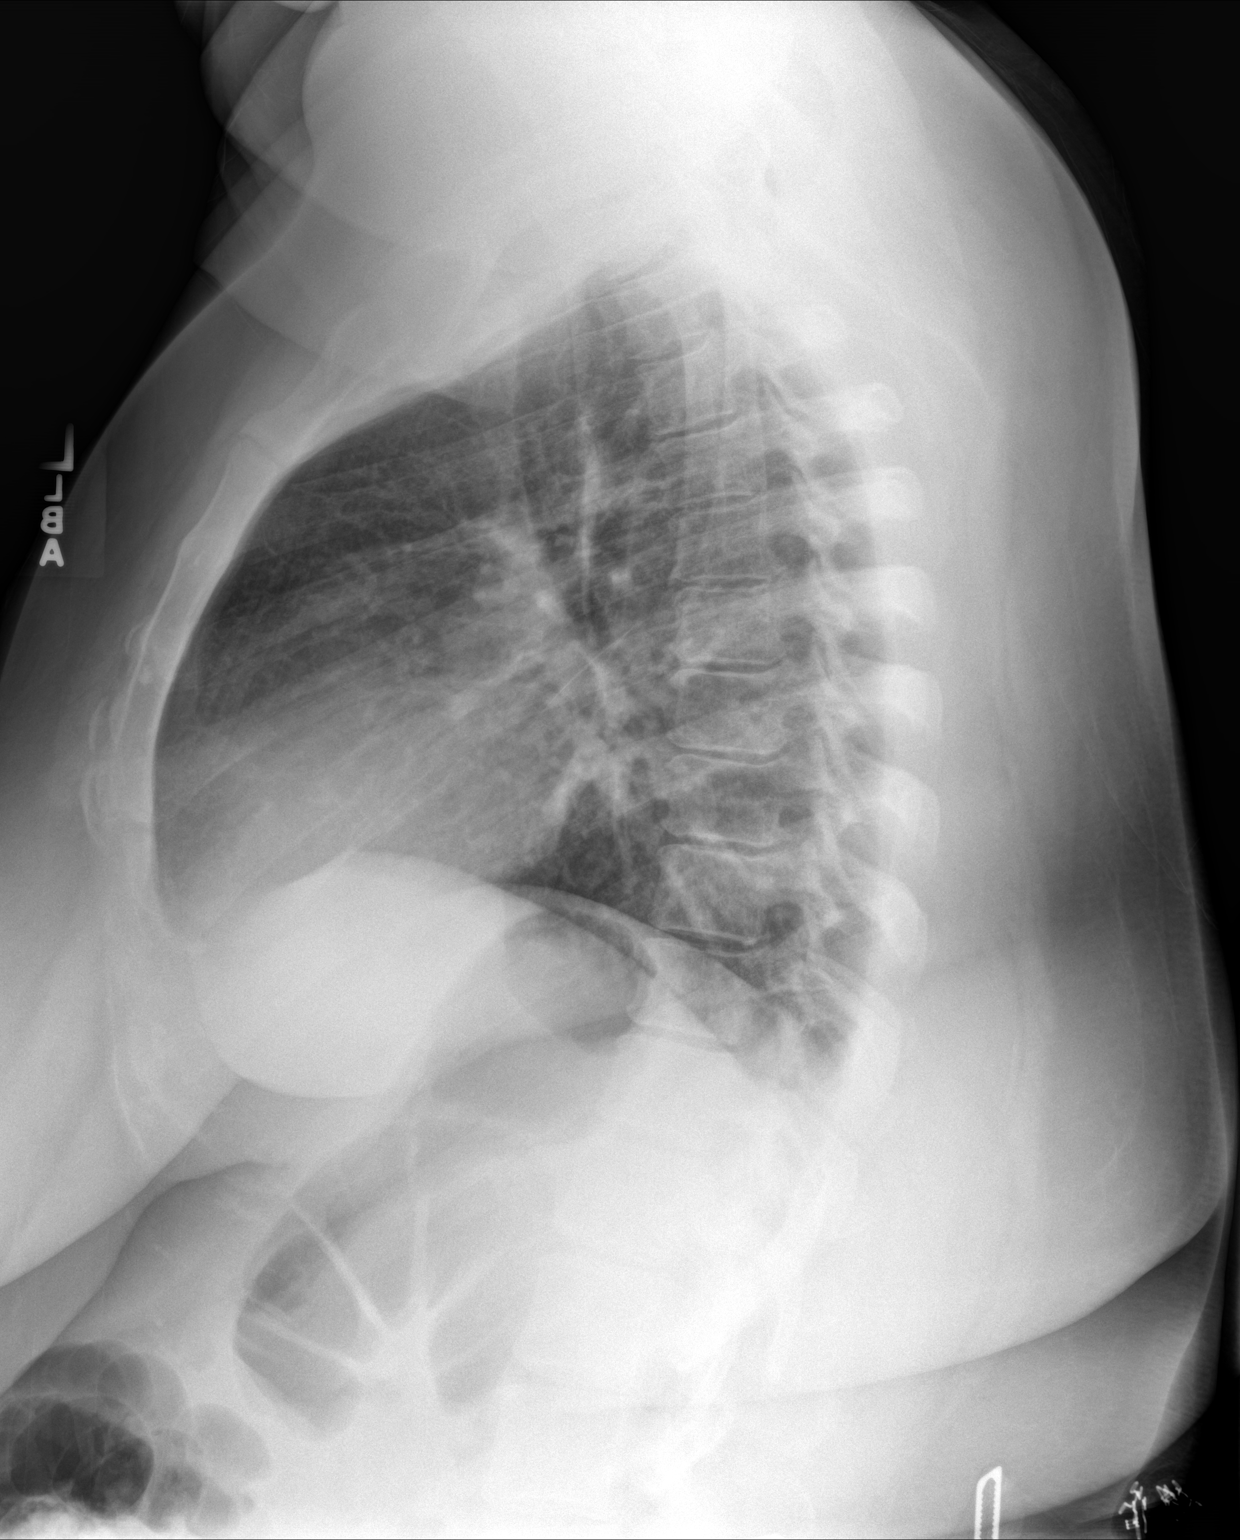

[2 of 2 positions shown; findings below may reference images not displayed]

FINDINGS: The heart size and mediastinal contours are within normal limits. No
focal airspace consolidation, pleural effusion, or pneumothorax. The
visualized skeletal structures are unremarkable.
IMPRESSION: No active cardiopulmonary disease.

## 2022-06-11 ENCOUNTER — Ambulatory Visit
Admission: EM | Admit: 2022-06-11 | Discharge: 2022-06-11 | Disposition: A | Discharge status: Home / Self Care | Payer: Self-pay | Attending: Internal Medicine | Admitting: Internal Medicine

## 2022-06-11 DIAGNOSIS — J069 Acute upper respiratory infection, unspecified: Secondary | ICD-10-CM

## 2022-06-11 DIAGNOSIS — J0110 Acute frontal sinusitis, unspecified: Secondary | ICD-10-CM

## 2022-06-11 MED ORDER — AMOXICILLIN-POT CLAVULANATE 875-125 MG PO TABS: 1.0000 | ORAL_TABLET | Freq: Two times a day (BID) | ORAL | 0 refills | Status: DC

## 2022-06-11 NOTE — ED Provider Notes (Signed)
[facility]    CSN: [id number] Arrival date & time: 06/11/22  0820      History   Chief Complaint Chief Complaint  Patient presents with   Cough   Facial Pain    HPI Melanie [name] is a 36 y.o. female who presents with onset of productive cough, nose congestion, post nasal drainage, and pressure behind her eyes and face pain and pressure x 3 days. Had 2 negative Covid tests. She is a smoker and has not smoked in one week. States she started with a cough 2 days ago which got better last week, but 3 days ago, the cough and nose congestion got worse. Has been blowing brown mucous from her nose, and cough production is clear. Denies a fever, but has had chills. Is fatigued. She has been taking Left over Tessalon and Sudafed.     Past Medical History:  Diagnosis Date   Anemia    Obesity affecting pregnancy    Dermoid Cyst    Patient Active Problem List   Diagnosis Date Noted   Nausea & vomiting 05/03/2015   Absolute anemia 02/22/2015    Past Surgical History:  Procedure Laterality Date   CHOLECYSTECTOMY     OVARIAN CYST REMOVAL      OB History     Gravida  2   Para  0   Term  0   Preterm  0   AB  1   Living  0      SAB      IAB  1   Ectopic  0   Multiple  0   Live Births           Obstetric Comments  Being see @ [place] due to "High Risk" pregnancy - weight related. [name], [name]           Home Medications    Prior to Admission medications   Medication Sig Start Date End Date Taking? Authorizing Provider  amoxicillin-clavulanate (AUGMENTIN) 875-125 MG tablet Take 1 tablet by mouth every 12 (twelve) hours. 06/11/22  Yes Rodriguez-Southworth, Nettie Elm, PA-C  benzonatate (TESSALON) 200 MG capsule Take 1 capsule (200 mg total) by mouth 3 (three) times daily as needed for cough. 11/01/21   [name], PA-C  fluticasone (FLONASE) 50 MCG/ACT nasal spray Place 2 sprays into both nostrils daily. 04/22/18 04/13/19   [name], MD    Family History Family History  Problem Relation Age of Onset   Hypertension Father    Diabetes Father    Heart disease Father    Ovarian cancer Maternal Grandmother    Prostate cancer Paternal Grandfather     Social History Social History   Tobacco Use   Smoking status: Former    Packs/day: 0.50    Types: Cigarettes    Quit date: 07/31/2018    Years since quitting: 3.8   Smokeless tobacco: Never  Vaping Use   Vaping Use: Never used  Substance Use Topics   Alcohol use: Not Currently   Drug use: Yes    Types: Marijuana     Allergies   Patient has no known allergies.   Review of Systems Review of Systems  Constitutional:  Positive for chills and fatigue. Negative for fever.  HENT:  Positive for congestion, postnasal drip, rhinorrhea, sinus pressure and sinus pain. Negative for ear discharge, ear pain and sore throat.   Respiratory:  Positive for cough. Negative for chest tightness and shortness of breath.   Musculoskeletal:  Negative for myalgias.  Neurological:  Negative for headaches.     Physical Exam Triage Vital Signs ED Triage Vitals  Enc Vitals Group     BP 06/11/22 0845 126/84     Pulse Rate 06/11/22 0845 76     Resp --      Temp 06/11/22 0845 98.4 F (36.9 C)     Temp Source 06/11/22 0845 Oral     SpO2 06/11/22 0845 99 %     Weight 06/11/22 0843 287 lb (130.2 kg)     Height 06/11/22 0843 5\' 4"  (1.626 m)     Head Circumference --      Peak Flow --      Pain Score 06/11/22 0843 10     Pain Loc --      Pain Edu? --      Excl. in GC? --    No data found.  Updated Vital Signs BP 126/84 (BP Location: Left Arm)   Pulse 76   Temp 98.4 F (36.9 C) (Oral)   Ht 5\' 4"  (1.626 m)   Wt 287 lb (130.2 kg)   LMP 05/28/2022 (Approximate)   SpO2 99%   BMI 49.26 kg/m   Visual Acuity Right Eye Distance:   Left Eye Distance:   Bilateral Distance:    Right Eye Near:   Left Eye Near:    Bilateral Near:      Physical  Exam Vitals signs and nursing note reviewed.  Constitutional:      General: She is not in acute distress.    Appearance: Normal appearance. She is not ill-appearing, toxic-appearing or diaphoretic.  HENT:     Head: Normocephalic.     Right Ear: Tympanic membrane, ear canal and external ear normal.     Left Ear: Tympanic membrane, ear canal and external ear normal.     Nose: with moderate congestion, light green mucous from L. Frontal sinuses are more tender than maxillary. Has decreased transillumination of both frontal sinuses.     Mouth/Throat: clear    Mouth: Mucous membranes are moist.  Eyes:     General: No scleral icterus.       Right eye: No discharge.        Left eye: No discharge.     Conjunctiva/sclera: Conjunctivae normal.  Neck:     Musculoskeletal: Neck supple. No neck rigidity.  Cardiovascular:     Rate and Rhythm: Normal rate and regular rhythm.     Heart sounds: No murmur.  Pulmonary:     Effort: Pulmonary effort is normal.     Breath sounds: Normal breath sounds.  AMusculoskeletal: Normal range of motion.  Lymphadenopathy:     Cervical: No cervical adenopathy.  Skin:    General: Skin is warm and dry.     Coloration: Skin is not jaundiced.     Findings: No rash.  Neurological:     Mental Status: She is alert and oriented to person, place, and time.     Gait: Gait normal.  Psychiatric:        Mood and Affect: Mood normal.        Behavior: Behavior normal.        Thought Content: Thought content normal.        Judgment: Judgment normal.    UC Treatments / Results  Labs (all labs ordered are listed, but only abnormal results are displayed) Labs Reviewed - No data to display  EKG   Radiology No results found.  Procedures Procedures (including  critical care time)  Medications Ordered in UC Medications - No data to display  Initial Impression / Assessment and Plan / UC Course  I have reviewed the triage vital signs and the nursing  notes.  Frontal sinusitis Cough  I placed her on Augmentin as noted. She is to continue taking the sudafed and tessalon prn since she has plenty of this. Also advised to do saline nose rinses bid. She was shown a video how to do them.   Final Clinical Impressions(s) / UC Diagnoses  Frontal sinusitis Cough Final diagnoses:  Viral URI with cough  Acute frontal sinusitis, recurrence not specified     Discharge Instructions      Do the saline rinses twice a day for 5-7 days til the sinus pressure is better     ED Prescriptions     Medication Sig Dispense Auth. Provider   amoxicillin-clavulanate (AUGMENTIN) 875-125 MG tablet Take 1 tablet by mouth every 12 (twelve) hours. 14 tablet Rodriguez-Southworth, Nettie Elm, PA-C      I have reviewed the PDMP during this encounter.   Garey Ham, PA-C 06/11/22 1040

## 2022-06-11 NOTE — Discharge Instructions (Addendum)
Do the saline rinses twice a day for 5-7 days til the sinus pressure is better

## 2022-06-11 NOTE — ED Triage Notes (Addendum)
Pt c/o cough, coughing up a lot of phlegm, pressure behind eyes, facial pain and pressure x3 days. Pt states she did take a home covid test x2 days ago (negative result)

## 2022-06-20 ENCOUNTER — Encounter: Payer: Self-pay | Admitting: Emergency Medicine

## 2022-06-20 ENCOUNTER — Ambulatory Visit
Admission: EM | Admit: 2022-06-20 | Discharge: 2022-06-20 | Disposition: A | Discharge status: Home / Self Care | Payer: Self-pay | Attending: Emergency Medicine | Admitting: Emergency Medicine

## 2022-06-20 DIAGNOSIS — K208 Other esophagitis without bleeding: Secondary | ICD-10-CM | POA: Insufficient documentation

## 2022-06-20 DIAGNOSIS — T50905A Adverse effect of unspecified drugs, medicaments and biological substances, initial encounter: Secondary | ICD-10-CM | POA: Insufficient documentation

## 2022-06-20 DIAGNOSIS — B3731 Acute candidiasis of vulva and vagina: Secondary | ICD-10-CM | POA: Insufficient documentation

## 2022-06-20 DIAGNOSIS — N760 Acute vaginitis: Secondary | ICD-10-CM | POA: Insufficient documentation

## 2022-06-20 DIAGNOSIS — N390 Urinary tract infection, site not specified: Secondary | ICD-10-CM | POA: Insufficient documentation

## 2022-06-20 DIAGNOSIS — R319 Hematuria, unspecified: Secondary | ICD-10-CM | POA: Insufficient documentation

## 2022-06-20 DIAGNOSIS — B9689 Other specified bacterial agents as the cause of diseases classified elsewhere: Secondary | ICD-10-CM | POA: Insufficient documentation

## 2022-06-20 LAB — URINALYSIS, ROUTINE W REFLEX MICROSCOPIC
Bilirubin Urine: NEGATIVE
Glucose, UA: NEGATIVE mg/dL
Hgb urine dipstick: NEGATIVE
Ketones, ur: NEGATIVE mg/dL
Nitrite: NEGATIVE
Protein, ur: NEGATIVE mg/dL
Specific Gravity, Urine: 1.025 (ref 1.005–1.030)
pH: 6 (ref 5.0–8.0)

## 2022-06-20 LAB — URINALYSIS, MICROSCOPIC (REFLEX)

## 2022-06-20 LAB — WET PREP, GENITAL
Sperm: NONE SEEN
Trich, Wet Prep: NONE SEEN
WBC, Wet Prep HPF POC: >10 — AB (ref <10)

## 2022-06-20 LAB — GROUP A STREP BY PCR: Group A Strep by PCR: NOT DETECTED

## 2022-06-20 MED ORDER — METRONIDAZOLE 500 MG PO TABS: 500.0000 mg | ORAL_TABLET | Freq: Two times a day (BID) | ORAL | 0 refills | Status: AC

## 2022-06-20 MED ORDER — NITROFURANTOIN MONOHYD MACRO 100 MG PO CAPS: 100.0000 mg | ORAL_CAPSULE | Freq: Two times a day (BID) | ORAL | 0 refills | Status: AC

## 2022-06-20 MED ORDER — FLUCONAZOLE 150 MG PO TABS: 150.0000 mg | ORAL_TABLET | Freq: Once | ORAL | 1 refills | Status: AC

## 2022-06-20 MED ORDER — PHENAZOPYRIDINE HCL 200 MG PO TABS: 200.0000 mg | ORAL_TABLET | Freq: Three times a day (TID) | ORAL | 0 refills | Status: DC | PRN

## 2022-06-20 NOTE — ED Provider Notes (Signed)
HPI  SUBJECTIVE:  Melanie [name] is a 36 y.o. female who presents with 2 issues: First, she reports pain with swallowing for the past 3 days.  She has been on Augmentin for 6 days, taking it once a day for a sinus infection.  No GERD, allergy symptoms.  No drooling, trismus, neck stiffness, sensation of throat swelling shut, muffled voice.  She has not tried anything for the symptoms.  No alleviating factors.  Symptoms are worse with swallowing.  Second, she reports dysuria, urgency, frequency for the past 3 days.  No cloudy or odorous urine, hematuria, vaginal odor, bleeding, discharge, itching or rash.  No fevers, abdominal, back, pelvic pain.  She states that she has not been sexually active in 1-1/2 years.  She tried increasing her fluid intake and eating fruit with improvement in her urinary symptoms.  Symptoms are worse with urination.  She has a past medical history of UTI, yeast trichomonas, gonorrhea.  No history of pyelonephritis, nephrolithiasis, diabetes, BV, other STDs.  Patient was seen here for cough and facial pain on 11/8, found to have frontal sinusitis and cough.  She was placed on Augmentin.  Past Medical History:  Diagnosis Date   Anemia    Obesity affecting pregnancy    Dermoid Cyst    Past Surgical History:  Procedure Laterality Date   CHOLECYSTECTOMY     OVARIAN CYST REMOVAL      Family History  Problem Relation Age of Onset   Hypertension Father    Diabetes Father    Heart disease Father    Ovarian cancer Maternal Grandmother    Prostate cancer Paternal Grandfather     Social History   Tobacco Use   Smoking status: Former    Packs/day: 0.50    Types: Cigarettes    Quit date: 07/31/2018    Years since quitting: 3.8   Smokeless tobacco: Never  Vaping Use   Vaping Use: Never used  Substance Use Topics   Alcohol use: Not Currently   Drug use: Yes    Types: Marijuana    No current facility-administered medications for this  encounter.  Current Outpatient Medications:    amoxicillin-clavulanate (AUGMENTIN) 875-125 MG tablet, Take 1 tablet by mouth every 12 (twelve) hours., Disp: 14 tablet, Rfl: 0   metroNIDAZOLE (FLAGYL) 500 MG tablet, Take 1 tablet (500 mg total) by mouth 2 (two) times daily for 7 days., Disp: 14 tablet, Rfl: 0   nitrofurantoin, macrocrystal-monohydrate, (MACROBID) 100 MG capsule, Take 1 capsule (100 mg total) by mouth 2 (two) times daily for 5 days., Disp: 10 capsule, Rfl: 0   phenazopyridine (PYRIDIUM) 200 MG tablet, Take 1 tablet (200 mg total) by mouth 3 (three) times daily as needed for pain., Disp: 6 tablet, Rfl: 0   benzonatate (TESSALON) 200 MG capsule, Take 1 capsule (200 mg total) by mouth 3 (three) times daily as needed for cough., Disp: 30 capsule, Rfl: 0  No Known Allergies   ROS  As noted in HPI.   Physical Exam  BP (!) 126/95 (BP Location: Left Arm)   Pulse 71   Temp 98.7 F (37.1 C) (Oral)   Resp 14   LMP 05/28/2022 (Approximate)   SpO2 100%   Constitutional: Well developed, well nourished, no acute distress Eyes:  EOMI, conjunctiva normal bilaterally HENT: Normocephalic, atraumatic,mucus membranes moist.  No sinus tenderness.  Normal oropharynx. Neck: No cervical lymphadenopathy Respiratory: Normal inspiratory effort Cardiovascular: Normal rate GI: nondistended soft, nontender.  No suprapubic, flank tenderness Back: No  CVAT skin: No rash, skin intact Musculoskeletal: no deformities Neurologic: Alert & oriented x 3, no focal neuro deficits Psychiatric: Speech and behavior appropriate   ED Course   Medications - No data to display  Orders Placed This Encounter  Procedures   Wet prep, genital    Standing Status:   Standing    Number of Occurrences:   1   Group A Strep by PCR    Standing Status:   Standing    Number of Occurrences:   1   Urine Culture    Standing Status:   Standing    Number of Occurrences:   1    Order Specific Question:   Indication     Answer:   Dysuria   Urinalysis, Routine w reflex microscopic Urine, Clean Catch    Standing Status:   Standing    Number of Occurrences:   1   Urinalysis, Microscopic (reflex)    Standing Status:   Standing    Number of Occurrences:   1    Results for orders placed or performed during the hospital encounter of 06/20/22 (from the past 24 hour(s))  Urinalysis, Routine w reflex microscopic Urine, Clean Catch     Status: Abnormal   Collection Time: 06/20/22  6:15 PM  Result Value Ref Range   Color, Urine YELLOW YELLOW   APPearance CLEAR CLEAR   Specific Gravity, Urine 1.025 1.005 - 1.030   pH 6.0 5.0 - 8.0   Glucose, UA NEGATIVE NEGATIVE mg/dL   Hgb urine dipstick NEGATIVE NEGATIVE   Bilirubin Urine NEGATIVE NEGATIVE   Ketones, ur NEGATIVE NEGATIVE mg/dL   Protein, ur NEGATIVE NEGATIVE mg/dL   Nitrite NEGATIVE NEGATIVE   Leukocytes,Ua MODERATE (A) NEGATIVE  Wet prep, genital     Status: Abnormal   Collection Time: 06/20/22  6:15 PM   Specimen: Vaginal  Result Value Ref Range   Yeast Wet Prep HPF POC PRESENT (A) NONE SEEN   Trich, Wet Prep NONE SEEN NONE SEEN   Clue Cells Wet Prep HPF POC PRESENT (A) NONE SEEN   WBC, Wet Prep HPF POC >10 (A) <10   Sperm NONE SEEN   Group A Strep by PCR     Status: None   Collection Time: 06/20/22  6:15 PM   Specimen: Throat; Sterile Swab  Result Value Ref Range   Group A Strep by PCR NOT DETECTED NOT DETECTED  Urinalysis, Microscopic (reflex)     Status: Abnormal   Collection Time: 06/20/22  6:15 PM  Result Value Ref Range   RBC / HPF 0-5 0 - 5 RBC/hpf   WBC, UA 21-50 0 - 5 WBC/hpf   Bacteria, UA MANY (A) NONE SEEN   Squamous Epithelial / LPF 6-10 0 - 5   Mucus PRESENT    Budding Yeast PRESENT    No results found.  ED Clinical Impression  1. Urinary tract infection with hematuria, site unspecified   2. BV (bacterial vaginosis)   3. Vaginal yeast infection   4. Pill esophagitis      ED Assessment/Plan     Previous records  reviewed.  As noted in HPI.  1.  Urinary symptoms.  Patient has a vaginal yeast infection, BV and appears to have a UTI with moderate leukocytes and many bacteria.  will send this off for culture.    UTI-Macrobid, Pyridium BV-Flagyl Yeast-Diflucan  2.  Sore throat.  Suspect pill esophagitis. Strep is negative.Benadryl/Maalox mixture  3. Recent sinusitis-improving.  Finish Augmentin.  Advised  her to increase it to twice a day as was originally written. . Follow-up with PCP.  Discussed labs,  MDM, treatment plan, and plan for follow-up with patient. patient agrees with plan.   Meds ordered this encounter  Medications   nitrofurantoin, macrocrystal-monohydrate, (MACROBID) 100 MG capsule    Sig: Take 1 capsule (100 mg total) by mouth 2 (two) times daily for 5 days.    Dispense:  10 capsule    Refill:  0   phenazopyridine (PYRIDIUM) 200 MG tablet    Sig: Take 1 tablet (200 mg total) by mouth 3 (three) times daily as needed for pain.    Dispense:  6 tablet    Refill:  0   metroNIDAZOLE (FLAGYL) 500 MG tablet    Sig: Take 1 tablet (500 mg total) by mouth 2 (two) times daily for 7 days.    Dispense:  14 tablet    Refill:  0   fluconazole (DIFLUCAN) 150 MG tablet    Sig: Take 1 tablet (150 mg total) by mouth once for 1 dose. 1 tab po x 1. May repeat in 72 hours if no improvement    Dispense:  2 tablet    Refill:  1      *This clinic note was created using [employer]. Therefore, there may be occasional mistakes despite careful proofreading.  ?    Melynda Ripple, MD 06/21/22 [id number]

## 2022-06-20 NOTE — ED Triage Notes (Signed)
Patient c/o sore throat that started on Wed.  Patient reports dysuria and frequency that started 2 days ago.  Patient denies fevers.  Patient states that she is still on antibiotic for her visit on 06/11/22.

## 2022-06-20 NOTE — Discharge Instructions (Signed)
The Macrobid is for a urinary tract infection.  I have sent your urine off for culture to make sure that we have you on the right antibiotic.  Continue drinking plenty of extra fluids.  The Pyridium will turn your urine orange, but will help with the pain, urgency and frequency.  I suspect your urinary symptoms are also partially coming from the vaginal yeast infection and BV.  Take the Diflucan and finish the Flagyl.  Some people find salt water gargles and  Traditional Medicinal's "Throat Coat" tea helpful. Take 5 mL of liquid Benadryl and 5 mL of Maalox. Mix it together, and then hold it in your mouth for as long as you can and then swallow. You may do this 4 times a day.  Honey and lemon dissolved in hot water can also be soothing.  Increase your Augmentin to 1 pill twice a day.  This will finish treating your sinus infection.  Go to www.goodrx.com  or www.costplusdrugs.com to look up your medications. This will give you a list of where you can find your prescriptions at the most affordable prices. Or ask the pharmacist what the cash price is, or if they have any other discount programs available to help make your medication more affordable. This can be less expensive than what you would pay with insurance.

## 2022-06-22 LAB — URINE CULTURE

## 2022-08-04 HISTORY — PX: SALPINGECTOMY: SHX328

## 2022-08-21 ENCOUNTER — Ambulatory Visit
Admission: EM | Admit: 2022-08-21 | Discharge: 2022-08-21 | Disposition: A | Discharge status: Home / Self Care | Payer: Medicaid Other | Attending: Emergency Medicine | Admitting: Emergency Medicine

## 2022-08-21 ENCOUNTER — Ambulatory Visit (INDEPENDENT_AMBULATORY_CARE_PROVIDER_SITE_OTHER): Payer: Medicaid Other

## 2022-08-21 DIAGNOSIS — J069 Acute upper respiratory infection, unspecified: Secondary | ICD-10-CM

## 2022-08-21 MED ORDER — BENZONATATE 100 MG PO CAPS: 200.0000 mg | ORAL_CAPSULE | Freq: Three times a day (TID) | ORAL | 0 refills | Status: DC

## 2022-08-21 MED ORDER — ALBUTEROL SULFATE HFA 108 (90 BASE) MCG/ACT IN AERS: 2.0000 | INHALATION_SPRAY | RESPIRATORY_TRACT | 0 refills | Status: DC | PRN

## 2022-08-21 MED ORDER — AEROCHAMBER MV MISC: 2 refills | Status: AC

## 2022-08-21 MED ORDER — IPRATROPIUM BROMIDE 0.06 % NA SOLN: 2.0000 | Freq: Four times a day (QID) | NASAL | 12 refills | Status: DC

## 2022-08-21 MED ORDER — PROMETHAZINE-DM 6.25-15 MG/5ML PO SYRP: 5.0000 mL | ORAL_SOLUTION | Freq: Four times a day (QID) | ORAL | 0 refills | Status: DC | PRN

## 2022-08-21 NOTE — Discharge Instructions (Signed)
Your chest x-ray did not show any signs of pneumonia.  Your physical exam is consistent with a viral respiratory infection.  I do not feel you have a need for an antibiotic at this time.  Use the albuterol inhaler with a spacer, 2 puffs every 4-6 hours, as needed for shortness of breath or wheezing.  Take Tylenol and ibuprofen according to package instructions as needed for fever or bodyaches.  Use the Atrovent nasal spray, 2 squirts in each nostril every 6 hours, as needed for runny nose and postnasal drip.  Use the Tessalon Perles every 8 hours during the day.  Take them with a small sip of water.  They may give you some numbness to the base of your tongue or a metallic taste in your mouth, this is normal.  Use the Promethazine DM cough syrup at bedtime for cough and congestion.  It will make you drowsy so do not take it during the day.  Return for reevaluation or see your primary care provider for any new or worsening symptoms.

## 2022-08-21 NOTE — ED Provider Notes (Signed)
[facility]    CSN: [id number] Arrival date & time: 08/21/22  1737      History   Chief Complaint No chief complaint on file.   HPI Melanie [name] is a 37 y.o. female.   HPI  37 year old female here for evaluation of cough.  The patient reports that she has been experiencing a productive cough for the last week.  Initially her phlegm was yellow but she states that it has become more clear over the past several days.  This is associated with shortness of breath, runny nose, and nasal congestion.  She denies any wheezing.  She does not have a history of asthma but she has been prescribed albuterol inhalers in the past when she has had respiratory illnesses.  She does suffer from obesity and has a BMI of 47.76.  Past Medical History:  Diagnosis Date   Anemia    Obesity affecting pregnancy    Dermoid Cyst    Patient Active Problem List   Diagnosis Date Noted   Nausea & vomiting 05/03/2015   Absolute anemia 02/22/2015    Past Surgical History:  Procedure Laterality Date   CHOLECYSTECTOMY     OVARIAN CYST REMOVAL      OB History     Gravida  2   Para  0   Term  0   Preterm  0   AB  1   Living  0      SAB      IAB  1   Ectopic  0   Multiple  0   Live Births           Obstetric Comments  Being see @ [place] due to "High Risk" pregnancy - weight related. [name], [name]           Home Medications    Prior to Admission medications   Medication Sig Start Date End Date Taking? Authorizing Provider  albuterol (VENTOLIN HFA) 108 (90 Base) MCG/ACT inhaler Inhale 2 puffs into the lungs every 4 (four) hours as needed. 08/21/22  Yes [name], NP  benzonatate (TESSALON) 100 MG capsule Take 2 capsules (200 mg total) by mouth every 8 (eight) hours. 08/21/22  Yes [name], NP  ipratropium (ATROVENT) 0.06 % nasal spray Place 2 sprays into both nostrils 4 (four) times daily. 08/21/22  Yes [name], NP   promethazine-dextromethorphan (PROMETHAZINE-DM) 6.25-15 MG/5ML syrup Take 5 mLs by mouth 4 (four) times daily as needed. 08/21/22  Yes [name], NP  Spacer/Aero-Holding Chambers (AEROCHAMBER MV) inhaler Use as instructed 08/21/22  Yes [name], NP  fluticasone [facility]) 50 MCG/ACT nasal spray Place 2 sprays into both nostrils daily. 04/22/18 04/13/19  [name], MD    Family History Family History  Problem Relation Age of Onset   Hypertension Father    Diabetes Father    Heart disease Father    Ovarian cancer Maternal Grandmother    Prostate cancer Paternal Grandfather     Social History Social History   Tobacco Use   Smoking status: Former    Packs/day: 0.50    Types: Cigarettes    Quit date: 07/31/2018    Years since quitting: 4.0   Smokeless tobacco: Never  Vaping Use   Vaping Use: Never used  Substance Use Topics   Alcohol use: Not Currently   Drug use: Yes    Types: Marijuana     Allergies   Patient has no known allergies.   Review of Systems Review  of Systems  Constitutional:  Negative for fever.  HENT:  Positive for congestion and rhinorrhea. Negative for ear pain and sore throat.   Respiratory:  Positive for cough and shortness of breath. Negative for wheezing.      Physical Exam Triage Vital Signs ED Triage Vitals  Enc Vitals Group     BP 08/21/22 1747 138/87     Pulse Rate 08/21/22 1747 70     Resp --      Temp 08/21/22 1747 98.5 F (36.9 C)     Temp Source 08/21/22 1747 Oral     SpO2 08/21/22 1747 100 %     Weight 08/21/22 1746 287 lb (130.2 kg)     Height 08/21/22 1746 5\' 5"  (1.651 m)     Head Circumference --      Peak Flow --      Pain Score 08/21/22 1746 10     Pain Loc --      Pain Edu? --      Excl. in [place]? --    No data found.  Updated Vital Signs BP 138/87 (BP Location: Right Arm)   Pulse 70   Temp 98.5 F (36.9 C) (Oral)   Ht 5\' 5"  (1.651 m)   Wt 287 lb (130.2 kg)   LMP 08/17/2022 (Exact Date)   SpO2 100%    BMI 47.76 kg/m   Visual Acuity Right Eye Distance:   Left Eye Distance:   Bilateral Distance:    Right Eye Near:   Left Eye Near:    Bilateral Near:     Physical Exam Vitals and nursing note reviewed.  Constitutional:      Appearance: Normal appearance. She is not ill-appearing.  HENT:     Head: Normocephalic and atraumatic.     Right Ear: Tympanic membrane, ear canal and external ear normal. There is no impacted cerumen.     Left Ear: Tympanic membrane, ear canal and external ear normal.     Nose: Congestion and rhinorrhea present.     Comments: Nasal mucosa is erythematous and edematous with clear discharge in both nares.    Mouth/Throat:     Mouth: Mucous membranes are moist.     Pharynx: Oropharynx is clear. No oropharyngeal exudate or posterior oropharyngeal erythema.  Cardiovascular:     Rate and Rhythm: Normal rate and regular rhythm.     Pulses: Normal pulses.     Heart sounds: Normal heart sounds. No murmur heard.    No friction rub. No gallop.  Pulmonary:     Effort: Pulmonary effort is normal.     Breath sounds: No wheezing, rhonchi or rales.     Comments: Patient has no appreciable rales, rhonchi, or wheezes but she does have diffusely diminished lung sounds throughout all lung fields. Musculoskeletal:     Cervical back: Normal range of motion and neck supple.  Lymphadenopathy:     Cervical: No cervical adenopathy.  Skin:    General: Skin is warm and dry.     Capillary Refill: Capillary refill takes less than 2 seconds.     Findings: No erythema or rash.  Neurological:     General: No focal deficit present.     Mental Status: She is alert and oriented to person, place, and time.  Psychiatric:        Mood and Affect: Mood normal.        Behavior: Behavior normal.        Thought Content: Thought content normal.  Judgment: Judgment normal.      UC Treatments / Results  Labs (all labs ordered are listed, but only abnormal results are  displayed) Labs Reviewed - No data to display  EKG   Radiology DG Chest 2 View  Result Date: 08/21/2022 CLINICAL DATA:  cough x 1 week EXAM: CHEST - 2 VIEW COMPARISON:  June 28, 2021 FINDINGS: The cardiomediastinal silhouette is unchanged in contour.Low lung volumes with bronchovascular crowding. No pleural effusion. No pneumothorax. No acute pleuroparenchymal abnormality. Visualized abdomen is unremarkable. Mild endplate proliferative changes of the thoracic spine. IMPRESSION: Low lung volumes with bronchovascular crowding. No acute cardiopulmonary abnormality. Electronically Signed   By: Valentino Saxon M.D.   On: 08/21/2022 18:21    Procedures Procedures (including critical care time)  Medications Ordered in UC Medications - No data to display  Initial Impression / Assessment and Plan / UC Course  I have reviewed the triage vital signs and the nursing notes.  Pertinent labs & imaging results that were available during my care of the patient were reviewed by me and considered in my medical decision making (see chart for details).   Patient is a pleasant, nontoxic-appearing 37 year old female here for evaluation of 1 week worth of cough and shortness of breath that has some associated upper respiratory symptoms as outlined in HPI above.  She is able to speak in full sentences without any dyspnea or tachypnea.  Her oxygen saturation is 100% on room air.  She endorses that her cough is productive and that the sputum has transition from yellow to clear over the last several days.  When auscultating the patient's chest she has diffusely decreased lung sounds in all fields.  She does have inflamed nasal mucosa with clear rhinorrhea and clear postnasal drip on exam.  I will obtain a chest x-ray to look for any acute cardiopulmonary pathology.  Radiology impression of chest x-ray states low lung volumes with bronchovascular crowding but no acute cardiopulmonary abnormality.  I will treat  patient for viral respiratory infection with Atrovent nasal spray, Tessalon Perles, and Promethazine DM cough syrup for cough and congestion.  I will also prescribe an albuterol inhaler with a spacer that she can do 2 puffs every 4-6 hours as needed for shortness of breath.  Final Clinical Impressions(s) / UC Diagnoses   Final diagnoses:  Viral URI with cough     Discharge Instructions      Your chest x-ray did not show any signs of pneumonia.  Your physical exam is consistent with a viral respiratory infection.  I do not feel you have a need for an antibiotic at this time.  Use the albuterol inhaler with a spacer, 2 puffs every 4-6 hours, as needed for shortness of breath or wheezing.  Take Tylenol and ibuprofen according to package instructions as needed for fever or bodyaches.  Use the Atrovent nasal spray, 2 squirts in each nostril every 6 hours, as needed for runny nose and postnasal drip.  Use the Tessalon Perles every 8 hours during the day.  Take them with a small sip of water.  They may give you some numbness to the base of your tongue or a metallic taste in your mouth, this is normal.  Use the Promethazine DM cough syrup at bedtime for cough and congestion.  It will make you drowsy so do not take it during the day.  Return for reevaluation or see your primary care provider for any new or worsening symptoms.  ED Prescriptions     Medication Sig Dispense Auth. Provider   albuterol (VENTOLIN HFA) 108 (90 Base) MCG/ACT inhaler Inhale 2 puffs into the lungs every 4 (four) hours as needed. 18 g Margarette Canada, NP   Spacer/Aero-Holding Chambers (AEROCHAMBER MV) inhaler Use as instructed 1 each Margarette Canada, NP   benzonatate (TESSALON) 100 MG capsule Take 2 capsules (200 mg total) by mouth every 8 (eight) hours. 21 capsule Margarette Canada, NP   ipratropium (ATROVENT) 0.06 % nasal spray Place 2 sprays into both nostrils 4 (four) times daily. 15 mL Margarette Canada, NP    promethazine-dextromethorphan (PROMETHAZINE-DM) 6.25-15 MG/5ML syrup Take 5 mLs by mouth 4 (four) times daily as needed. 118 mL Margarette Canada, NP      PDMP not reviewed this encounter.   Margarette Canada, NP 08/21/22 [phone]

## 2022-08-21 NOTE — ED Triage Notes (Signed)
Pt c/o cough onset over last weekend, pt states has been worsening. C/o chest tightness from cough, pt does have an inhaler that she has been using

## 2022-09-06 ENCOUNTER — Ambulatory Visit
Admission: EM | Admit: 2022-09-06 | Discharge: 2022-09-06 | Disposition: A | Discharge status: Home / Self Care | Payer: Medicaid Other | Attending: Family Medicine | Admitting: Family Medicine

## 2022-09-06 ENCOUNTER — Ambulatory Visit (INDEPENDENT_AMBULATORY_CARE_PROVIDER_SITE_OTHER): Payer: Medicaid Other

## 2022-09-06 DIAGNOSIS — J189 Pneumonia, unspecified organism: Secondary | ICD-10-CM

## 2022-09-06 MED ORDER — AMOXICILLIN-POT CLAVULANATE 875-125 MG PO TABS: 1.0000 | ORAL_TABLET | Freq: Two times a day (BID) | ORAL | 0 refills | Status: DC

## 2022-09-06 MED ORDER — AZITHROMYCIN 250 MG PO TABS: 250.0000 mg | ORAL_TABLET | Freq: Every day | ORAL | 0 refills | Status: DC

## 2022-09-06 NOTE — ED Provider Notes (Signed)
[facility]    CSN: [id number] Arrival date & time: 09/06/22  1149      History   Chief Complaint Chief Complaint  Patient presents with   Cough    HPI  37 year old female presents with persistent cough.  Patient evaluated on 1/18 and was diagnosed with viral URI with cough.  She was treated with albuterol, Promethazine DM, Atrovent nasal spray, and Tessalon Perles.  Patient reports that she continues to feel poorly.  She reports productive cough which is severe.  Results in posttussive emesis.  No fever.  She does endorse shortness of breath.  No other respiratory symptoms.  Denies fever.   Home Medications    Prior to Admission medications   Medication Sig Start Date End Date Taking? Authorizing Provider  albuterol (VENTOLIN HFA) 108 (90 Base) MCG/ACT inhaler Inhale 2 puffs into the lungs every 4 (four) hours as needed. 08/21/22  Yes Margarette Canada, NP  amoxicillin-clavulanate (AUGMENTIN) 875-125 MG tablet Take 1 tablet by mouth every 12 (twelve) hours. 09/06/22  Yes Travis Purk G, DO  azithromycin (ZITHROMAX) 250 MG tablet Take 1 tablet (250 mg total) by mouth daily. Take first 2 tablets together, then 1 every day until finished. 09/06/22  Yes Veora Fonte G, DO  ipratropium (ATROVENT) 0.06 % nasal spray Place 2 sprays into both nostrils 4 (four) times daily. 08/21/22  Yes Margarette Canada, NP  promethazine-dextromethorphan (PROMETHAZINE-DM) 6.25-15 MG/5ML syrup Take 5 mLs by mouth 4 (four) times daily as needed. 08/21/22  Yes Margarette Canada, NP  Spacer/Aero-Holding Chambers (AEROCHAMBER MV) inhaler Use as instructed 08/21/22  Yes Margarette Canada, NP  fluticasone [facility]) 50 MCG/ACT nasal spray Place 2 sprays into both nostrils daily. 04/22/18 04/13/19  Melynda Ripple, MD    Family History Family History  Problem Relation Age of Onset   Hypertension Father    Diabetes Father    Heart disease Father    Ovarian cancer Maternal Grandmother    Prostate cancer Paternal Grandfather      Social History Social History   Tobacco Use   Smoking status: Some Days    Packs/day: 0.50    Types: Cigarettes    Last attempt to quit: 07/31/2018    Years since quitting: 4.1   Smokeless tobacco: Never  Vaping Use   Vaping Use: Never used  Substance Use Topics   Alcohol use: Not Currently   Drug use: Yes    Types: Marijuana     Allergies   Patient has no known allergies.   Review of Systems Review of Systems  Constitutional:  Negative for fever.  Respiratory:  Positive for cough and shortness of breath.    Physical Exam Triage Vital Signs ED Triage Vitals  Enc Vitals Group     BP 09/06/22 1241 123/76     Pulse Rate 09/06/22 1241 70     Resp 09/06/22 1241 18     Temp 09/06/22 1241 98.9 F (37.2 C)     Temp Source 09/06/22 1241 Oral     SpO2 09/06/22 1241 100 %     Weight 09/06/22 1240 287 lb (130.2 kg)     Height 09/06/22 1240 5\' 5"  (1.651 m)     Head Circumference --      Peak Flow --      Pain Score 09/06/22 1240 10     Pain Loc --      Pain Edu? --      Excl. in [place]? --     Updated Vital Signs  BP 123/76 (BP Location: Left Arm)   Pulse 70   Temp 98.9 F (37.2 C) (Oral)   Resp 18   Ht 5\' 5"  (1.651 m)   Wt 130.2 kg   LMP 08/17/2022 (Exact Date)   SpO2 100%   BMI 47.76 kg/m   Visual Acuity Right Eye Distance:   Left Eye Distance:   Bilateral Distance:    Right Eye Near:   Left Eye Near:    Bilateral Near:     Physical Exam Vitals and nursing note reviewed.  Constitutional:      General: She is not in acute distress. HENT:     Head: Normocephalic and atraumatic.  Eyes:     General:        Right eye: No discharge.        Left eye: No discharge.     Conjunctiva/sclera: Conjunctivae normal.  Cardiovascular:     Rate and Rhythm: Normal rate and regular rhythm.  Pulmonary:     Effort: Pulmonary effort is normal.     Breath sounds: No wheezing or rales.  Neurological:     Mental Status: She is alert.      UC Treatments /  Results  Labs (all labs ordered are listed, but only abnormal results are displayed) Labs Reviewed - No data to display  EKG   Radiology DG Chest 2 View  Result Date: 09/06/2022 CLINICAL DATA:  Persistent cough and shortness of breath. EXAM: CHEST - 2 VIEW COMPARISON:  08/21/2022 FINDINGS: Interval development of linear and patchy airspace disease at the left base. Right lung clear. The cardiopericardial silhouette is within normal limits for size. No pleural effusion The visualized bony structures of the thorax are unremarkable. IMPRESSION: Atelectasis and/or pneumonia at the left base. Electronically Signed   By: Misty Stanley M.D.   On: 09/06/2022 13:24    Procedures Procedures (including critical care time)  Medications Ordered in UC Medications - No data to display  Initial Impression / Assessment and Plan / UC Course  I have reviewed the triage vital signs and the nursing notes.  Pertinent labs & imaging results that were available during my care of the patient were reviewed by me and considered in my medical decision making (see chart for details).    37 year old female presents with persistent cough.  Chest x-ray was obtained and was independently reviewed by me.  Interpretation: Left lower lobe pneumonia.  Treating with Augmentin and azithromycin.  Final Clinical Impressions(s) / UC Diagnoses   Final diagnoses:  Community acquired pneumonia of left lower lobe of lung   Discharge Instructions   None    ED Prescriptions     Medication Sig Dispense Auth. Provider   amoxicillin-clavulanate (AUGMENTIN) 875-125 MG tablet Take 1 tablet by mouth every 12 (twelve) hours. 14 tablet Langston Tuberville G, DO   azithromycin (ZITHROMAX) 250 MG tablet Take 1 tablet (250 mg total) by mouth daily. Take first 2 tablets together, then 1 every day until finished. 6 tablet Coral Spikes, DO      PDMP not reviewed this encounter.   Coral Spikes, [place] 09/06/22 1342

## 2022-09-06 NOTE — ED Triage Notes (Signed)
Pt c/o continued symptoms since 08/21/21.   Pt states she is now having yellow productive cough and chest pressure.  Pt has used all of the medication given on 08/21/21 and states she is now coughing hard enough to vomit.

## 2023-01-29 ENCOUNTER — Ambulatory Visit
Admission: EM | Admit: 2023-01-29 | Discharge: 2023-01-29 | Disposition: A | Discharge status: Home / Self Care | Payer: Medicaid Other | Attending: Physician Assistant | Admitting: Physician Assistant

## 2023-01-29 ENCOUNTER — Ambulatory Visit (INDEPENDENT_AMBULATORY_CARE_PROVIDER_SITE_OTHER): Payer: Medicaid Other

## 2023-01-29 DIAGNOSIS — B3731 Acute candidiasis of vulva and vagina: Secondary | ICD-10-CM | POA: Diagnosis not present

## 2023-01-29 DIAGNOSIS — K529 Noninfective gastroenteritis and colitis, unspecified: Secondary | ICD-10-CM

## 2023-01-29 DIAGNOSIS — R112 Nausea with vomiting, unspecified: Secondary | ICD-10-CM | POA: Diagnosis present

## 2023-01-29 DIAGNOSIS — R051 Acute cough: Secondary | ICD-10-CM

## 2023-01-29 DIAGNOSIS — Z1152 Encounter for screening for COVID-19: Secondary | ICD-10-CM | POA: Diagnosis not present

## 2023-01-29 DIAGNOSIS — R1084 Generalized abdominal pain: Secondary | ICD-10-CM

## 2023-01-29 LAB — CBC WITH DIFFERENTIAL/PLATELET
Abs Immature Granulocytes: 0.02 10*3/uL (ref 0.00–0.07)
Basophils Absolute: 0 10*3/uL (ref 0.0–0.1)
Basophils Relative: 0 %
Eosinophils Absolute: 0.1 10*3/uL (ref 0.0–0.5)
Eosinophils Relative: 1 %
HCT: 38.4 % (ref 36.0–46.0)
Hemoglobin: 12.5 g/dL (ref 12.0–15.0)
Immature Granulocytes: 0 %
Lymphocytes Relative: 32 %
Lymphs Abs: 2.8 10*3/uL (ref 0.7–4.0)
MCH: 27.3 pg (ref 26.0–34.0)
MCHC: 32.6 g/dL (ref 30.0–36.0)
MCV: 83.8 fL (ref 80.0–100.0)
Monocytes Absolute: 0.4 10*3/uL (ref 0.1–1.0)
Monocytes Relative: 5 %
Neutro Abs: 5.5 10*3/uL (ref 1.7–7.7)
Neutrophils Relative %: 62 %
Platelets: 296 10*3/uL (ref 150–400)
RBC: 4.58 MIL/uL (ref 3.87–5.11)
RDW: 13.7 % (ref 11.5–15.5)
WBC: 8.9 10*3/uL (ref 4.0–10.5)
nRBC: 0 % (ref 0.0–0.2)

## 2023-01-29 LAB — SARS CORONAVIRUS 2 BY RT PCR: SARS Coronavirus 2 by RT PCR: NEGATIVE

## 2023-01-29 LAB — PREGNANCY, URINE: Preg Test, Ur: NEGATIVE

## 2023-01-29 LAB — COMPREHENSIVE METABOLIC PANEL
ALT: 19 U/L (ref 0–44)
AST: 20 U/L (ref 15–41)
Albumin: 3.9 g/dL (ref 3.5–5.0)
Alkaline Phosphatase: 68 U/L (ref 38–126)
Anion gap: 7 (ref 5–15)
BUN: 18 mg/dL (ref 6–20)
CO2: 22 mmol/L (ref 22–32)
Calcium: 8.5 mg/dL — ABNORMAL LOW (ref 8.9–10.3)
Chloride: 106 mmol/L (ref 98–111)
Creatinine, Ser: 0.96 mg/dL (ref 0.44–1.00)
GFR, Estimated: >60 mL/min (ref >60)
Glucose, Bld: 96 mg/dL (ref 70–99)
Potassium: 4.1 mmol/L (ref 3.5–5.1)
Sodium: 135 mmol/L (ref 135–145)
Total Bilirubin: 0.3 mg/dL (ref 0.3–1.2)
Total Protein: 6.8 g/dL (ref 6.5–8.1)

## 2023-01-29 LAB — URINALYSIS, W/ REFLEX TO CULTURE (INFECTION SUSPECTED)
Bilirubin Urine: NEGATIVE
Glucose, UA: NEGATIVE mg/dL
Ketones, ur: NEGATIVE mg/dL
Nitrite: NEGATIVE
Specific Gravity, Urine: >1.03 — ABNORMAL HIGH (ref 1.005–1.030)
pH: 5.5 (ref 5.0–8.0)

## 2023-01-29 LAB — LIPASE, BLOOD: Lipase: 41 U/L (ref 11–51)

## 2023-01-29 MED ORDER — FLUCONAZOLE 150 MG PO TABS: 150.0000 mg | ORAL_TABLET | Freq: Every day | ORAL | 0 refills | Status: AC

## 2023-01-29 MED ORDER — ONDANSETRON 8 MG PO TBDP
8.0000 mg | ORAL_TABLET | Freq: Once | ORAL | Status: AC
  Administered 2023-01-29: 8 mg via ORAL

## 2023-01-29 MED ORDER — ONDANSETRON HCL 4 MG PO TABS: 4.0000 mg | ORAL_TABLET | Freq: Four times a day (QID) | ORAL | 0 refills | Status: DC | PRN

## 2023-01-29 NOTE — Discharge Instructions (Addendum)
-  You are negative for COVID and you do not have pneumonia. - Your lab work is all reassuring. - There is some bacteria in the urine so there could be a UTI but low suspicion for that since you are having symptoms.  Urine to be sent for culture.  We will call you if you need antibiotics.  You have a yeast infection so I sent Diflucan to pharmacy. - Symptoms likely related to stomach virus.  Should be feeling better a couple days.  Take Zofran as needed.  Increase rest and fluids.  Tylenol as needed for pain relief.  May also consider use of Pepto-Bismol. - If you develop a fever or feel your abdominal pain is worsening, please go to the ER.  ABDOMINAL PAIN: You may take Tylenol for pain relief. Use medications as directed including antiemetics and antidiarrheal medications if suggested or prescribed. You should increase fluids and electrolytes as well as rest over these next several days. If you have any questions or concerns, or if your symptoms are not improving or if especially if they acutely worsen, please call or stop back to the clinic immediately and we will be happy to help you or go to the ER   ABDOMINAL PAIN RED FLAGS: Seek immediate further care if: symptoms remain the same or worsen over the next 3-7 days, you are unable to keep fluids down, you see blood or mucus in your stool, you vomit black or dark red material, you have a fever of 101.F or higher, you have localized and/or persistent abdominal pain

## 2023-01-29 NOTE — ED Triage Notes (Signed)
Pt c/o cough, congestion, chills x2days  Pt states that she was working and while standing outside she got dizzy, had nausea, and SOB.  PT states that she began to vomit a few hours later and couldn't keep down fluids with body aches and body chills.   Pt states that this morning she woke up with body chills and vomiting clear mucous.

## 2023-01-29 NOTE — ED Provider Notes (Signed)
[facility]    CSN: [id number] Arrival date & time: 01/29/23  0827      History   Chief Complaint Chief Complaint  Patient presents with   Vomiting    HPI Melanie [name] is a 37 y.o. female presenting for feeling ill since yesterday.  Patient reports fatigue, chills, body aches, nausea/vomiting, abdominal pain, cough, and feeling dizzy.  Patient denies any associated fever, congestion, sore throat, chest pain, SOB, or wheezing. Reports thinks the cough began after the vomiting. Vomitus has been clear mucus. No dysuria, frequency or urgency.  Denies vaginal itching or discharge.  Denies any sick contacts or known exposure to COVID.  Patient does have history of cholecystectomy in 2017.  Also history of pancreatitis but that was related to gallbladder issue.  Has not had any issues with pancreatitis since.  Denies any alcohol use.  No other complaints.  HPI  Past Medical History:  Diagnosis Date   Anemia    Obesity affecting pregnancy    Dermoid Cyst    Patient Active Problem List   Diagnosis Date Noted   Nausea & vomiting 05/03/2015   Absolute anemia 02/22/2015    Past Surgical History:  Procedure Laterality Date   CHOLECYSTECTOMY     OVARIAN CYST REMOVAL      OB History     Gravida  2   Para  0   Term  0   Preterm  0   AB  1   Living  0      SAB      IAB  1   Ectopic  0   Multiple  0   Live Births           Obstetric Comments  Being see @ [place] due to "High Risk" pregnancy - weight related. [name], [name]           Home Medications    Prior to Admission medications   Medication Sig Start Date End Date Taking? Authorizing Provider  albuterol (VENTOLIN HFA) 108 (90 Base) MCG/ACT inhaler Inhale 2 puffs into the lungs every 4 (four) hours as needed. 08/21/22  Yes [name], NP  fluconazole (DIFLUCAN) 150 MG tablet Take 1 tablet (150 mg total) by mouth daily for 1 day. 01/29/23 01/30/23 Yes Eusebio Friendly B,  PA-C  ondansetron (ZOFRAN) 4 MG tablet Take 1 tablet (4 mg total) by mouth every 6 (six) hours as needed for nausea or vomiting. 01/29/23  Yes [name], PA-C  Spacer/Aero-Holding Chambers (AEROCHAMBER MV) inhaler Use as instructed 08/21/22  Yes [name], NP  fluticasone (FLONASE) 50 MCG/ACT nasal spray Place 2 sprays into both nostrils daily. 04/22/18 04/13/19  [name], MD    Family History Family History  Problem Relation Age of Onset   Hypertension Father    Diabetes Father    Heart disease Father    Ovarian cancer Maternal Grandmother    Prostate cancer Paternal Grandfather     Social History Social History   Tobacco Use   Smoking status: Some Days    Packs/day: .5    Types: Cigarettes    Last attempt to quit: 07/31/2018    Years since quitting: 4.5   Smokeless tobacco: Never  Vaping Use   Vaping Use: Never used  Substance Use Topics   Alcohol use: Not Currently   Drug use: Yes    Types: Marijuana     Allergies   Patient has no known allergies.   Review of Systems  Review of Systems  Constitutional:  Positive for chills and fatigue. Negative for diaphoresis and fever.  HENT:  Positive for congestion and rhinorrhea. Negative for ear pain, sinus pressure, sinus pain and sore throat.   Respiratory:  Positive for cough. Negative for chest tightness and shortness of breath.   Cardiovascular:  Negative for chest pain.  Gastrointestinal:  Positive for nausea and vomiting. Negative for abdominal pain.  Musculoskeletal:  Negative for arthralgias and myalgias.  Skin:  Negative for rash.  Neurological:  Positive for dizziness. Negative for weakness and headaches.  Hematological:  Negative for adenopathy.     Physical Exam Triage Vital Signs ED Triage Vitals  Enc Vitals Group     BP      Pulse      Resp      Temp      Temp src      SpO2      Weight      Height      Head Circumference      Peak Flow      Pain Score      Pain Loc      Pain Edu?       Excl. in GC?    No data found.  Updated Vital Signs BP 128/85 (BP Location: Left Arm)   Pulse (!) 58   Temp 98.6 F (37 C) (Oral)   Ht 5\' 4"  (1.626 m)   Wt 285 lb (129.3 kg)   LMP 12/26/2022   SpO2 100%   BMI 48.92 kg/m      Physical Exam Vitals and nursing note reviewed.  Constitutional:      General: She is not in acute distress.    Appearance: Normal appearance. She is obese. She is ill-appearing. She is not toxic-appearing.  HENT:     Head: Normocephalic and atraumatic.     Nose: No congestion.     Mouth/Throat:     Mouth: Mucous membranes are moist.     Pharynx: Oropharynx is clear.  Eyes:     General: No scleral icterus.       Right eye: No discharge.        Left eye: No discharge.     Conjunctiva/sclera: Conjunctivae normal.  Cardiovascular:     Rate and Rhythm: Normal rate and regular rhythm.     Heart sounds: Normal heart sounds.  Pulmonary:     Effort: Pulmonary effort is normal. No respiratory distress.     Breath sounds: No wheezing or rhonchi.  Abdominal:     Palpations: Abdomen is soft.     Tenderness: There is abdominal tenderness (generalized). There is no right CVA tenderness, left CVA tenderness, guarding or rebound.  Musculoskeletal:     Cervical back: Neck supple.  Skin:    General: Skin is dry.  Neurological:     General: No focal deficit present.     Mental Status: She is alert. Mental status is at baseline.     Motor: No weakness.     Gait: Gait normal.  Psychiatric:        Mood and Affect: Mood normal.        Behavior: Behavior normal.        Thought Content: Thought content normal.      UC Treatments / Results  Labs (all labs ordered are listed, but only abnormal results are displayed) Labs Reviewed  COMPREHENSIVE METABOLIC PANEL - Abnormal; Notable for the following components:      Result Value  Calcium 8.5 (*)    All other components within normal limits  URINALYSIS, W/ REFLEX TO CULTURE (INFECTION SUSPECTED) -  Abnormal; Notable for the following components:   APPearance CLOUDY (*)    Specific Gravity, Urine >1.030 (*)    Hgb urine dipstick MODERATE (*)    Protein, ur TRACE (*)    Leukocytes,Ua TRACE (*)    Bacteria, UA MANY (*)    All other components within normal limits  SARS CORONAVIRUS 2 BY RT PCR  URINE CULTURE  CBC WITH DIFFERENTIAL/PLATELET  LIPASE, BLOOD  PREGNANCY, URINE    EKG   Radiology DG Chest 2 View  Result Date: 01/29/2023 CLINICAL DATA:  Cough and chills. EXAM: CHEST - 2 VIEW COMPARISON:  Chest x-ray February 3, 24. FINDINGS: The heart size and mediastinal contours are within normal limits. Both lungs are clear. No visible pleural effusions or pneumothorax. No acute osseous abnormality. IMPRESSION: No active cardiopulmonary disease. Electronically Signed   By: Feliberto Harts M.D.   On: 01/29/2023 10:12    Procedures Procedures (including critical care time)  Medications Ordered in UC Medications  ondansetron (ZOFRAN-ODT) disintegrating tablet 8 mg (8 mg Oral Given 01/29/23 0912)     Initial Impression / Assessment and Plan / UC Course  I have reviewed the triage vital signs and the nursing notes.  Pertinent labs & imaging results that were available during my care of the patient were reviewed by me and considered in my medical decision making (see chart for details).  37 year old female presenting for feeling ill since yesterday and reports generalized abdominal pain/cramping, nausea/vomiting, chills, fatigue, body aches and cough.  No recorded fever.  No sick contacts.  Vitals are all stable.  She is afebrile.  She is ill-appearing.  Nontoxic.  Normal HEENT exam.  Chest clear to auscultation heart regular rate and rhythm.  Abdomen soft with generalized tenderness to palpation but most tenderness of the right upper quadrant and epigastric region.  No guarding or rebound.  No CVA tenderness.  Urinalysis shows cloudy urine with greater than 1.030 specific gravity,  hemoglobin, protein, trace leukocytes, many bacteria and yeast.  Will send urine for culture but since she does not have any urinary symptoms we will hold off on antibiotic therapy.  Will treat for yeast infection with Diflucan.  Urine pregnancy negative.  COVID test negative.  Chest x-ray normal.  CBC, CMP and lipase obtained.  Lab work is all normal and reassuring.  Patient was given 8 mg ODT Zofran in clinic.  She reports that it was helpful for her symptoms.  Suspect symptoms are related to viral gastroenteritis.  Supportive care encouraged with increasing rest and fluids.  Sent Zofran to pharmacy.  Reviewed very close monitoring and advised to go to ER if fever or worsening abdominal pain.  1 acute illness with systemic symptoms.  Final Clinical Impressions(s) / UC Diagnoses   Final diagnoses:  Generalized abdominal pain  Acute gastroenteritis  Nausea and vomiting, unspecified vomiting type  Vaginal yeast infection  Acute cough     Discharge Instructions      -You are negative for COVID and you do not have pneumonia. - Your lab work is all reassuring. - There is some bacteria in the urine so there could be a UTI but low suspicion for that since you are having symptoms.  Urine to be sent for culture.  We will call you if you need antibiotics.  You have a yeast infection so I sent Diflucan to pharmacy. -  Symptoms likely related to stomach virus.  Should be feeling better a couple days.  Take Zofran as needed.  Increase rest and fluids.  Tylenol as needed for pain relief.  May also consider use of Pepto-Bismol. - If you develop a fever or feel your abdominal pain is worsening, please go to the ER.  ABDOMINAL PAIN: You may take Tylenol for pain relief. Use medications as directed including antiemetics and antidiarrheal medications if suggested or prescribed. You should increase fluids and electrolytes as well as rest over these next several days. If you have any questions or  concerns, or if your symptoms are not improving or if especially if they acutely worsen, please call or stop back to the clinic immediately and we will be happy to help you or go to the ER   ABDOMINAL PAIN RED FLAGS: Seek immediate further care if: symptoms remain the same or worsen over the next 3-7 days, you are unable to keep fluids down, you see blood or mucus in your stool, you vomit black or dark red material, you have a fever of 101.F or higher, you have localized and/or persistent abdominal pain         ED Prescriptions     Medication Sig Dispense Auth. Provider   ondansetron (ZOFRAN) 4 MG tablet Take 1 tablet (4 mg total) by mouth every 6 (six) hours as needed for nausea or vomiting. 15 tablet Eusebio Friendly B, PA-C   fluconazole (DIFLUCAN) 150 MG tablet Take 1 tablet (150 mg total) by mouth daily for 1 day. 1 tablet Gareth Morgan      PDMP not reviewed this encounter.     [name], PA-C 01/29/23 1031

## 2023-01-30 LAB — URINE CULTURE

## 2023-02-07 ENCOUNTER — Ambulatory Visit
Admission: EM | Admit: 2023-02-07 | Discharge: 2023-02-07 | Disposition: A | Discharge status: Home / Self Care | Payer: Medicaid Other | Attending: Internal Medicine | Admitting: Internal Medicine

## 2023-02-07 ENCOUNTER — Ambulatory Visit: Payer: Medicaid Other

## 2023-02-07 ENCOUNTER — Ambulatory Visit (INDEPENDENT_AMBULATORY_CARE_PROVIDER_SITE_OTHER): Payer: Medicaid Other

## 2023-02-07 DIAGNOSIS — M7989 Other specified soft tissue disorders: Secondary | ICD-10-CM

## 2023-02-07 DIAGNOSIS — M79674 Pain in right toe(s): Secondary | ICD-10-CM

## 2023-02-07 NOTE — ED Provider Notes (Signed)
[facility]    CSN: [id number] Arrival date & time: 02/07/23  1253      History   Chief Complaint Chief Complaint  Patient presents with   Foot Pain    HPI Melanie [name] is a 37 y.o. female right foot pain and swelling.  She reports she slipped at work on Wednesday and slightly rolled her foot but did not fall.  She has not had any issues until today when she woke up and had severe pain in her right foot.  She describes the pain as sharp and stabbing.  The pain is worse with ambulation.  She reports associated bruising and swelling.  She denies numbness, tingling or weakness.  She has not tried anything OTC for this.  HPI  Past Medical History:  Diagnosis Date   Anemia    Obesity affecting pregnancy    Dermoid Cyst    Patient Active Problem List   Diagnosis Date Noted   Nausea & vomiting 05/03/2015   Absolute anemia 02/22/2015    Past Surgical History:  Procedure Laterality Date   CHOLECYSTECTOMY     OVARIAN CYST REMOVAL      OB History     Gravida  2   Para  0   Term  0   Preterm  0   AB  1   Living  0      SAB      IAB  1   Ectopic  0   Multiple  0   Live Births           Obstetric Comments  Being see @ [place] due to "High Risk" pregnancy - weight related. Melanie [name], Melanie [name]           Home Medications    Prior to Admission medications   Medication Sig Start Date End Date Taking? Authorizing Provider  albuterol (VENTOLIN HFA) 108 (90 Base) MCG/ACT inhaler Inhale 2 puffs into the lungs every 4 (four) hours as needed. 08/21/22  Yes [name], NP  ondansetron (ZOFRAN) 4 MG tablet Take 1 tablet (4 mg total) by mouth every 6 (six) hours as needed for nausea or vomiting. 01/29/23  Yes [name], PA-C  Spacer/Aero-Holding Chambers (AEROCHAMBER MV) inhaler Use as instructed 08/21/22  Yes [name], NP  fluticasone (FLONASE) 50 MCG/ACT nasal spray Place 2 sprays into both nostrils daily. 04/22/18 04/13/19   [name], MD    Family History Family History  Problem Relation Age of Onset   Hypertension Father    Diabetes Father    Heart disease Father    Ovarian cancer Maternal Grandmother    Prostate cancer Paternal Grandfather     Social History Social History   Tobacco Use   Smoking status: Some Days    Packs/day: .5    Types: Cigarettes    Last attempt to quit: 07/31/2018    Years since quitting: 4.5   Smokeless tobacco: Never  Vaping Use   Vaping Use: Never used  Substance Use Topics   Alcohol use: Not Currently   Drug use: Yes    Types: Marijuana     Allergies   Patient has no known allergies.   Review of Systems Review of Systems  Respiratory:  Negative for cough and shortness of breath.   Cardiovascular:  Negative for chest pain.  Musculoskeletal:  Positive for arthralgias and joint swelling.  Skin:  Positive for color change.       Bruise to top of right  foot  Neurological:  Negative for weakness and numbness.     Physical Exam Triage Vital Signs ED Triage Vitals  Enc Vitals Group     BP 02/07/23 1358 (!) 136/92     Pulse Rate 02/07/23 1358 65     Resp --      Temp 02/07/23 1358 98.7 F (37.1 C)     Temp Source 02/07/23 1358 Oral     SpO2 02/07/23 1358 100 %     Weight 02/07/23 1356 287 lb (130.2 kg)     Height 02/07/23 1356 5\' 4"  (1.626 m)     Head Circumference --      Peak Flow --      Pain Score 02/07/23 1355 10     Pain Loc --      Pain Edu? --      Excl. in GC? --    No data found.  Updated Vital Signs BP (!) 136/92 (BP Location: Left Arm)   Pulse 65   Temp 98.7 F (37.1 C) (Oral)   Ht 5\' 4"  (1.626 m)   Wt 287 lb (130.2 kg)   LMP 02/04/2023   SpO2 100%   BMI 49.26 kg/m       Physical Exam Constitutional:      Appearance: She is obese.  Cardiovascular:     Rate and Rhythm: Normal rate and regular rhythm.     Pulses: Normal pulses.     Heart sounds: Normal heart sounds.  Pulmonary:     Effort: Pulmonary effort is  normal.     Breath sounds: Normal breath sounds.  Musculoskeletal:     Comments: Normal flexion, extension and rotation of the right ankle.  No pain with palpation of the Achilles tendon, heel or malleoli.  Pain with palpation over the second and third metatarsal.  1+ swelling noted over this same area.  Skin:    Capillary Refill: Capillary refill takes less than 2 seconds.     Findings: Bruising present.     Comments: Noted over the first and second metatarsal  Neurological:     General: No focal deficit present.     Mental Status: She is alert.     Sensory: No sensory deficit.     Motor: No weakness.      UC Treatments / Results  Labs   EKG   Radiology Imaging Orders         DG Foot Complete Right       Medications Ordered in UC Medications - No data to display  Initial Impression / Assessment and Plan / UC Course  I have reviewed the triage vital signs and the nursing notes.  Pertinent labs & imaging results that were available during my care of the patient were reviewed by me and considered in my medical decision making (see chart for details).     37 year old female with complaint of right foot pain and swelling.  X-ray foot does not show any acute bony abnormality.  Based on exam, I feel like she has busted blood vessel superficially.  Encouraged rest, ice, compression and elevation.  Advised her to follow-up for new or worsening symptoms.  Final Clinical Impressions([name]) / UC Diagnoses   Final diagnoses:  Pain and swelling of toe of right foot     Discharge Instructions      You were seen today for right foot pain and swelling.  Your x-ray does not show any evidence of acute fracture.  I think you may have  busted a blood vessel underneath the skin.  This will hurt for a little while but will improve with time.  We encourage rest, ice, elevation and compression.  Please follow-up if your symptoms persist or worsen     ED Prescriptions   None    PDMP  not reviewed this encounter.   Lorre Munroe, NP 02/07/23 1510

## 2023-02-07 NOTE — Discharge Instructions (Signed)
You were seen today for right foot pain and swelling.  Your x-ray does not show any evidence of acute fracture.  I think you may have busted a blood vessel underneath the skin.  This will hurt for a little while but will improve with time.  We encourage rest, ice, elevation and compression.  Please follow-up if your symptoms persist or worsen

## 2023-02-07 NOTE — ED Triage Notes (Signed)
PT states that she went to work on Wednesday and slipped.  Pt denies falling but states that she now has pain in her right foot and a knot that shoots pain up her leg when it is pressed.   Pt states that her foot is swollen behind her toes and is unsure if her foot is bruised. Pt has a darker complexion moving up her foot from her big toe to her ankle and she states that she has not seen it before.

## 2023-04-16 ENCOUNTER — Encounter: Payer: Self-pay | Admitting: Emergency Medicine

## 2023-04-16 ENCOUNTER — Ambulatory Visit (INDEPENDENT_AMBULATORY_CARE_PROVIDER_SITE_OTHER): Payer: Medicaid Other

## 2023-04-16 ENCOUNTER — Ambulatory Visit
Admission: EM | Admit: 2023-04-16 | Discharge: 2023-04-16 | Disposition: A | Discharge status: Home / Self Care | Payer: Medicaid Other | Attending: Family Medicine | Admitting: Family Medicine

## 2023-04-16 DIAGNOSIS — F17210 Nicotine dependence, cigarettes, uncomplicated: Secondary | ICD-10-CM | POA: Diagnosis present

## 2023-04-16 DIAGNOSIS — R059 Cough, unspecified: Secondary | ICD-10-CM

## 2023-04-16 DIAGNOSIS — J209 Acute bronchitis, unspecified: Secondary | ICD-10-CM

## 2023-04-16 LAB — PREGNANCY, URINE: Preg Test, Ur: NEGATIVE

## 2023-04-16 MED ORDER — ALBUTEROL SULFATE HFA 108 (90 BASE) MCG/ACT IN AERS: 2.0000 | INHALATION_SPRAY | RESPIRATORY_TRACT | 0 refills | Status: AC | PRN

## 2023-04-16 MED ORDER — AZITHROMYCIN 250 MG PO TABS: ORAL_TABLET | ORAL | 0 refills | Status: DC

## 2023-04-16 MED ORDER — PROMETHAZINE-DM 6.25-15 MG/5ML PO SYRP: 5.0000 mL | ORAL_SOLUTION | Freq: Four times a day (QID) | ORAL | 0 refills | Status: DC | PRN

## 2023-04-16 MED ORDER — PREDNISONE 50 MG PO TABS: 50.0000 mg | ORAL_TABLET | Freq: Every day | ORAL | 0 refills | Status: AC

## 2023-04-16 NOTE — ED Triage Notes (Signed)
Pt c/o productive cough, nasal congestion, SOB x 2 weeks. Pt had abx left over from when she had pneumonia and she started to feel better. Her symptoms returned yesterday.

## 2023-04-16 NOTE — Discharge Instructions (Addendum)
Your chest x-ray did not show any pneumonia.  Stop by the pharmacy to pick up your albuterol inhaler, antibiotics, steroids, and cough syrup.  If your shortness of breath and cough do not improve over the next week, return to the urgent care or be seen in the emergency department if you get more short of breath.

## 2023-04-16 NOTE — ED Provider Notes (Signed)
[facility]    CSN: [id number] Arrival date & time: 04/16/23  1455      History   Chief Complaint Chief Complaint  Patient presents with   Cough   Fever   Shortness of Breath    HPI Melanie [name] is a 37 y.o. female.   HPI  History obtained from the patient. Melanie [name] presents for productive cough, shortness of breath that started 2 weeks. She took some old antibiotics (~ 7 pills) which helped a lot. I "felt soooo much better."  Tmax 101 F last Saturday.  She feels tired. COVID test was negative 3 days ago.  She is a smoker but has cut back.  No history of COPD or asthma.      Past Medical History:  Diagnosis Date   Anemia    Obesity affecting pregnancy    Dermoid Cyst    Patient Active Problem List   Diagnosis Date Noted   Nausea & vomiting 05/03/2015   Absolute anemia 02/22/2015    Past Surgical History:  Procedure Laterality Date   CHOLECYSTECTOMY     OVARIAN CYST REMOVAL      OB History     Gravida  2   Para  0   Term  0   Preterm  0   AB  1   Living  0      SAB      IAB  1   Ectopic  0   Multiple  0   Live Births           Obstetric Comments  Being see @ [place] due to "High Risk" pregnancy - weight related. [name], [name]           Home Medications    Prior to Admission medications   Medication Sig Start Date End Date Taking? Authorizing Provider  azithromycin (ZITHROMAX Z-PAK) 250 MG tablet Take 2 tablets on day 1 then 1 tablet daily 04/16/23  Yes Janaia Kozel, DO  predniSONE (DELTASONE) 50 MG tablet Take 1 tablet (50 mg total) by mouth daily for 5 days. 04/16/23 04/21/23 Yes Nyaira Hodgens, DO  promethazine-dextromethorphan (PROMETHAZINE-DM) 6.25-15 MG/5ML syrup Take 5 mLs by mouth 4 (four) times daily as needed. 04/16/23  Yes Hart Haas, DO  albuterol (VENTOLIN HFA) 108 (90 Base) MCG/ACT inhaler Inhale 2 puffs into the lungs every 4 (four) hours as needed. 04/16/23   [name], [place]ard Meth,  DO  ondansetron (ZOFRAN) 4 MG tablet Take 1 tablet (4 mg total) by mouth every 6 (six) hours as needed for nausea or vomiting. 01/29/23   [name], PA-C  Spacer/Aero-Holding Chambers (AEROCHAMBER MV) inhaler Use as instructed 08/21/22   [name], NP  fluticasone (FLONASE) 50 MCG/ACT nasal spray Place 2 sprays into both nostrils daily. 04/22/18 04/13/19  [name], MD    Family History Family History  Problem Relation Age of Onset   Hypertension Father    Diabetes Father    Heart disease Father    Ovarian cancer Maternal Grandmother    Prostate cancer Paternal Grandfather     Social History Social History   Tobacco Use   Smoking status: Some Days    Current packs/day: 0.00    Types: Cigarettes    Last attempt to quit: 07/31/2018    Years since quitting: 4.7   Smokeless tobacco: Never  Vaping Use   Vaping status: Never Used  Substance Use Topics   Alcohol use: Not Currently   Drug use: Yes  Types: Marijuana     Allergies   Patient has no known allergies.   Review of Systems Review of Systems: negative unless otherwise stated in HPI.      Physical Exam Triage Vital Signs ED Triage Vitals  Encounter Vitals Group     BP 04/16/23 1508 122/82     Systolic BP Percentile --      Diastolic BP Percentile --      Pulse Rate 04/16/23 1508 67     Resp 04/16/23 1508 20     Temp 04/16/23 1508 98.8 F (37.1 C)     Temp Source 04/16/23 1508 Oral     SpO2 04/16/23 1508 98 %     Weight --      Height --      Head Circumference --      Peak Flow --      Pain Score 04/16/23 1507 7     Pain Loc --      Pain Education --      Exclude from Growth Chart --    No data found.  Updated Vital Signs BP 122/82 (BP Location: Right Arm)   Pulse 67   Temp 98.8 F (37.1 C) (Oral)   Resp 20   LMP 04/01/2023   SpO2 98%   Visual Acuity Right Eye Distance:   Left Eye Distance:   Bilateral Distance:    Right Eye Near:   Left Eye Near:    Bilateral Near:      Physical Exam GEN:     alert, non-toxic appearing female in no distress    HENT:  mucus membranes moist, clear nasal discharge, EYES:   no scleral injection or discharge RESP:  no increased work of breathing, productive cough, coarse breathe sounds, no wheezing  CVS:   regular rate and rhythm Skin:   warm and dry, no rash on visible skin    UC Treatments / Results  Labs (all labs ordered are listed, but only abnormal results are displayed) Labs Reviewed  PREGNANCY, URINE    EKG   Radiology DG Chest 2 View  Result Date: 04/16/2023 CLINICAL DATA:  productive cough for 2 weeks EXAM: CHEST - 2 VIEW COMPARISON:  June 2024 FINDINGS: The cardiomediastinal silhouette is within normal limits. No pleural effusion. No pneumothorax. No mass or consolidation. No acute osseous abnormality. IMPRESSION: No acute findings in the chest. Electronically Signed   By: Olive Bass M.D.   On: 04/16/2023 16:25     Procedures Procedures (including critical care time)  Medications Ordered in UC Medications - No data to display  Initial Impression / Assessment and Plan / UC Course  I have reviewed the triage vital signs and the nursing notes.  Pertinent labs & imaging results that were available during my care of the patient were reviewed by me and considered in my medical decision making (see chart for details).       Pt is a 37 y.o. female who smokes cigarette presents for 2 weeks of cough that is not improving.  Laure is  afebrile here without recent antipyretics. Satting well on room air. Overall pt is  non-toxic appearing, well hydrated, without respiratory distress. Pulmonary exam is remarkable for coarse breath sounds..  After shared decision making, we will pursue chest x-ray. Home COVID testing was negative.   Chest xray personally reviewed by me without focal pneumonia, pleural effusion, cardiomegaly or pneumothorax.  Radiologist impression reviewed.   Treat acute bronchitis with  steroids and antibiotics as  below.  Though she smokes she denies history of COPD.  She was treated for COPD at the February 2024 emergency department visit in [place].    Prescribed Promethazine DM cough syrup given for cough and allow patient to rest.  Albuterol inhaler prescribed. Typical duration of symptoms discussed. Return and ED precautions given and patient voiced understanding.   Discussed MDM, treatment plan and plan for follow-up with patient who agrees with plan.      Final Clinical Impressions(s) / UC Diagnoses   Final diagnoses:  Acute bronchitis, unspecified organism  Cigarette smoker     Discharge Instructions      Your chest x-ray did not show any pneumonia.  Stop by the pharmacy to pick up your albuterol inhaler, antibiotics, steroids, and cough syrup.  If your shortness of breath and cough do not improve over the next week, return to the urgent care or be seen in the emergency department if you get more short of breath.     ED Prescriptions     Medication Sig Dispense Auth. Provider   albuterol (VENTOLIN HFA) 108 (90 Base) MCG/ACT inhaler Inhale 2 puffs into the lungs every 4 (four) hours as needed. 18 g Laronica Bhagat, DO   predniSONE (DELTASONE) 50 MG tablet Take 1 tablet (50 mg total) by mouth daily for 5 days. 5 tablet Tylek Boney, DO   azithromycin (ZITHROMAX Z-PAK) 250 MG tablet Take 2 tablets on day 1 then 1 tablet daily 6 tablet Kayin Osment, DO   promethazine-dextromethorphan (PROMETHAZINE-DM) 6.25-15 MG/5ML syrup Take 5 mLs by mouth 4 (four) times daily as needed. 118 mL Katha Cabal, DO      PDMP not reviewed this encounter.   Katha Cabal, DO 04/19/23 [phone]

## 2023-09-04 ENCOUNTER — Ambulatory Visit (INDEPENDENT_AMBULATORY_CARE_PROVIDER_SITE_OTHER): Payer: Medicaid Other

## 2023-09-04 VITALS — BP 112/73 | HR 61 | Ht 65.0 in | Wt 280.0 lb

## 2023-09-04 DIAGNOSIS — Z3200 Encounter for pregnancy test, result unknown: Secondary | ICD-10-CM

## 2023-09-04 DIAGNOSIS — O3680X0 Pregnancy with inconclusive fetal viability, not applicable or unspecified: Secondary | ICD-10-CM

## 2023-09-04 DIAGNOSIS — Z3201 Encounter for pregnancy test, result positive: Secondary | ICD-10-CM | POA: Diagnosis not present

## 2023-09-04 DIAGNOSIS — Z3A01 Less than 8 weeks gestation of pregnancy: Secondary | ICD-10-CM

## 2023-09-04 DIAGNOSIS — Z7689 Persons encountering health services in other specified circumstances: Secondary | ICD-10-CM

## 2023-09-04 LAB — POCT URINE PREGNANCY: Preg Test, Ur: POSITIVE — AB

## 2023-09-04 NOTE — Progress Notes (Signed)
   GYN ENCOUNTER  Encounter for second opinion on viability of pregnancy  Subjective  HPI: Melanie [name] is a 38 y.o. G3P0010 who presents today for evaluation of pregnancy.  Was seen at [facility] ED on 08/29/23 for cramping early in pregnancy. She denies any bleeding at that time or since. Imaging done at the time showed an IUP with CRL consistent with gestational age of [redacted]w[redacted]d. Cardiac activity unable to be assessed. "Debris within the endometrial canal, suspicious for blood product." She states she was told by the ED that she is most likely going to have a miscarriage.   First day of LMP is 12/17 which correlates with a pregnancy with gestational age of [redacted]w[redacted]d today. She is continuing to experience occasional cramping but no bleeding or other pregnancy related symptoms.   OB history includes one abortion in 2009 followed by a normal pregnancy with SVB in 2016.   Past Medical History:  Diagnosis Date   Anemia    Obesity affecting pregnancy    Dermoid Cyst   Past Surgical History:  Procedure Laterality Date   CHOLECYSTECTOMY     OVARIAN CYST REMOVAL     OB History     Gravida  3   Para  0   Term  0   Preterm  0   AB  1   Living  0      SAB      IAB  1   Ectopic  0   Multiple  0   Live Births           Obstetric Comments  Being see @ [place] due to "High Risk" pregnancy - weight related. [name], [name]         No Known Allergies  Review of Systems  12 point ROS negative except for pertinent positives noted in HPO above.   Objective  BP 112/73   Pulse 61   Ht 5\' 5"  (1.651 m)   Wt 280 lb (127 kg)   LMP 07/21/2023 (Exact Date)   BMI 46.59 kg/m   Physical examination GENERAL APPEARANCE: alert, well appearing LUNGS: normal work of breathing HEART: normal heart rate   Assessment/Plan - Pregnancy of unknown viability. Discussed in detail with patient that  imaging at 5 weeks may not show cardiac activity, and we could not determine  at this time if this pregnancy was continuing to progress without more information.  - Positive UPT today in clinic. - Serial beta hcg ordered. - Follow up viability ultrasound in 2 weeks - Reviewed miscarriage precautions.   Lindalou Hose Melanie [name], CNM  09/04/23 3:23 PM

## 2023-09-05 LAB — BETA HCG QUANT (REF LAB): hCG Quant: 37805 m[IU]/mL

## 2023-09-07 ENCOUNTER — Other Ambulatory Visit: Payer: Medicaid Other

## 2023-09-07 ENCOUNTER — Ambulatory Visit: Payer: Medicaid Other

## 2023-09-07 VITALS — BP 109/53 | Ht 65.0 in | Wt 280.0 lb

## 2023-09-07 DIAGNOSIS — Z3200 Encounter for pregnancy test, result unknown: Secondary | ICD-10-CM

## 2023-09-07 DIAGNOSIS — Z3201 Encounter for pregnancy test, result positive: Secondary | ICD-10-CM

## 2023-09-07 DIAGNOSIS — Z309 Encounter for contraceptive management, unspecified: Secondary | ICD-10-CM

## 2023-09-07 LAB — PREGNANCY, URINE: Preg Test, Ur: POSITIVE — AB

## 2023-09-07 MED ORDER — PRENATAL 27-0.8 MG PO TABS: 1.0000 | ORAL_TABLET | Freq: Every day | ORAL | Status: AC

## 2023-09-07 NOTE — Progress Notes (Signed)
UPT positive. Plans prenatal care at [facility]. Has appt today at [facility] for Beta HCG and ultrasound on 09/14/2023, ER visit on 08/29/2023 and blood in sac, recommended to f-u with OB.   Positive preg packet given and reviewed.  Patient Denies abd cramping and vaginal bleeding today.   RN counseled patient that if experiences abd cramping, she is to drink 2 glasses water and rest and if pain persists or gets worse, she is  to seek immediate med attn/go to ER. If bright red vaginal bleeding, she is advised to seek immed med attn/go to ER. Questions answered and reports understanding.   RN informed provider Hazle Coca, CNM of patient status and agrees with counseling by nurse.  RN advised patient to f-u with OB provider today as scheduled.   The patient was dispensed prenatal vitamins #100 today per [facility] Dr Lorrin Mais. I provided counseling today regarding the medication. We discussed the medication, the side effects and when to call clinic. Patient given the opportunity to ask questions. Questions answered.  Sent to clerk for presumptive elig/medicaid/preg women. Jerel Shepherd, RN

## 2023-09-07 NOTE — Progress Notes (Signed)
UPT positive. Plans prenatal care at [facility]. Has appt today at [facility] for Beta HCG and ultrasound on 09/14/2023, ER visit on 08/29/2023 and blood in sac, recommended to f-u with OB.   Positive preg packet given and reviewed.  Patient Denies abd cramping and vaginal bleeding today.   RN counseled patient that if experiences abd cramping, she is to drink 2 glasses water and rest and if pain persists or gets worse, she is  to seek immediate med attn/go to ER. If bright red vaginal bleeding, she is advised to seek immed med attn/go to ER. Questions answered and reports understanding.   RN informed provider Hazle Coca, CNM of patient status and agrees with counseling by nurse.  RN advised patient to f-u with OB provider today as scheduled.   The patient was dispensed prenatal vitamins #100 today per [facility] Dr Lorrin Mais. I provided counseling today regarding the medication. We discussed the medication, the side effects and when to call clinic. Patient given the opportunity to ask questions. Questions answered.  Sent to clerk for presumptive elig/medicaid/preg women. Jerel Shepherd, RN Consulted on the plan of care for this client.  I agree with the documented note and actions taken to provide care for this client.  Hazle Coca, CNM

## 2023-09-08 LAB — BETA HCG QUANT (REF LAB): hCG Quant: 55986 m[IU]/mL

## 2023-09-14 ENCOUNTER — Other Ambulatory Visit: Payer: Self-pay

## 2023-09-14 ENCOUNTER — Ambulatory Visit
Admission: RE | Admit: 2023-09-14 | Discharge: 2023-09-14 | Disposition: A | Discharge status: Home / Self Care | Payer: Medicaid Other | Source: Ambulatory Visit

## 2023-09-14 ENCOUNTER — Other Ambulatory Visit: Payer: Medicaid Other

## 2023-09-14 DIAGNOSIS — Z3200 Encounter for pregnancy test, result unknown: Secondary | ICD-10-CM | POA: Insufficient documentation

## 2023-09-14 DIAGNOSIS — Z7689 Persons encountering health services in other specified circumstances: Secondary | ICD-10-CM

## 2023-09-28 ENCOUNTER — Telehealth: Payer: Self-pay | Admitting: Family Medicine

## 2023-09-28 NOTE — Telephone Encounter (Signed)
 Call to client to verify no questions / concerns about anything. Per client, went to [facility] and told may be having a SAB, but no US done [facility] did not review [facility]). Client then had US done 09/14/23 at [facility] to verify "everything okay". Per client, desires prenatal care at [facility] and actually enroute now to agency to complete presumptive paperwork. Jossie Ng, RN

## 2023-09-28 NOTE — Telephone Encounter (Signed)
 Miscommunication issue somewhere solved

## 2023-10-15 ENCOUNTER — Ambulatory Visit: Payer: Medicaid Other | Admitting: Physician Assistant

## 2023-10-15 ENCOUNTER — Encounter: Payer: Self-pay | Admitting: Physician Assistant

## 2023-10-15 VITALS — BP 112/66 | HR 72 | Temp 96.9°F | Wt 291.6 lb

## 2023-10-15 DIAGNOSIS — Z3A12 12 weeks gestation of pregnancy: Secondary | ICD-10-CM

## 2023-10-15 DIAGNOSIS — O09529 Supervision of elderly multigravida, unspecified trimester: Secondary | ICD-10-CM | POA: Insufficient documentation

## 2023-10-15 DIAGNOSIS — O99211 Obesity complicating pregnancy, first trimester: Secondary | ICD-10-CM | POA: Diagnosis not present

## 2023-10-15 DIAGNOSIS — O09521 Supervision of elderly multigravida, first trimester: Secondary | ICD-10-CM | POA: Diagnosis not present

## 2023-10-15 DIAGNOSIS — O0991 Supervision of high risk pregnancy, unspecified, first trimester: Secondary | ICD-10-CM

## 2023-10-15 DIAGNOSIS — O0990 Supervision of high risk pregnancy, unspecified, unspecified trimester: Secondary | ICD-10-CM | POA: Insufficient documentation

## 2023-10-15 DIAGNOSIS — O99210 Obesity complicating pregnancy, unspecified trimester: Secondary | ICD-10-CM | POA: Insufficient documentation

## 2023-10-15 LAB — HEMOGLOBIN, FINGERSTICK: Hemoglobin: 11.7 g/dL (ref 11.1–15.9)

## 2023-10-15 MED ORDER — ASPIRIN 81 MG PO TBEC: 81.0000 mg | DELAYED_RELEASE_TABLET | Freq: Every day | ORAL | 2 refills | Status: DC

## 2023-10-15 MED ORDER — ASPIRIN 81 MG PO TBEC: 81.0000 mg | DELAYED_RELEASE_TABLET | Freq: Every day | ORAL | Status: DC

## 2023-10-15 NOTE — Progress Notes (Signed)
 [facility] [address] [place] [address] Main phone: [phone]  Initial Prenatal Visit  Subjective:  Melanie [name] is a 38 y.o. G3P1011 at [redacted]w[redacted]d being seen today to start prenatal care at the [place] [facility]. She is currently monitored for the following issues for this high-risk pregnancy:   Patient Active Problem List   Diagnosis Date Noted   Supervision of high-risk pregnancy, unspecified trimester 10/15/2023   Antepartum multigravida of advanced maternal age - 32 at [facility] 10/15/2023   Maternal morbid obesity, antepartum (HCC) Pregrav BMI 46.6 10/15/2023   Patient reports fatigue.  Contractions: Not present. Vag. Bleeding: None.  Movement: Absent. Denies leaking of fluid.   Indications for ASA therapy One of the following: Previous pregnancy with preeclampsia, especially early onset and with an adverse outcome No  Multifetal gestation No  Chronic hypertension No  Type 1 or 2 diabetes mellitus No  Chronic kidney disease No  Autoimmune disease (antiphospholipid syndrome, systemic lupus erythematosus) No   Two or more of the following: Nulliparity  No  Obesity (body mass index >30 kg/m2) Yes  Family history of preeclampsia in mother or sister No  Age >=35 years Yes  Sociodemographic characteristics (African American race, low socioeconomic level) Yes  Personal risk factors (eg, previous pregnancy with low birth weight or small for gestational age infant, previous adverse pregnancy outcome [eg, stillbirth], interval >10 years between pregnancies) No   The following portions of the patient's history were reviewed and updated as appropriate: allergies, current medications, past family history, past medical history, past social history, past surgical history and problem list. Problem list updated.  Objective:   Vitals:   10/15/23 0847  BP: 112/66  Pulse: 72  Temp: (!) 96.9 F  (36.1 C)  Weight: 291 lb 9.6 oz (132.3 kg)   Fetal Status: Fetal Heart Rate (bpm): 152 Fundal Height:  (unable to assess due to habitus) Movement: Absent  Presentation: Undeterminable  Physical Exam Vitals and nursing note reviewed. Exam conducted with a chaperone present Burt Knack, Charity fundraiser).  Constitutional:      General: She is not in acute distress.    Appearance: Normal appearance. She is well-developed. She is obese.  HENT:     Head: Normocephalic and atraumatic.     Right Ear: External ear normal.     Left Ear: External ear normal.     Nose: Nose normal. No congestion or rhinorrhea.     Mouth/Throat:     Lips: Pink.     Mouth: Mucous membranes are moist.     Dentition: Normal dentition. No dental caries.     Pharynx: Oropharynx is clear. Uvula midline.     Comments: Dentition: teeth without obvious decay Eyes:     General: No scleral icterus.    Conjunctiva/sclera: Conjunctivae normal.  Neck:     Thyroid: No thyroid mass or thyromegaly.  Cardiovascular:     Rate and Rhythm: Normal rate and regular rhythm.     Pulses: Normal pulses.     Heart sounds: Normal heart sounds.     Comments: Extremities are warm and well perfused Pulmonary:     Effort: Pulmonary effort is normal.     Breath sounds: Normal breath sounds.  Chest:     Chest wall: No mass.  Breasts:    Tanner Score is 5.     Breasts are symmetrical.     Right: Normal. No mass, nipple discharge or skin change.  Left: Normal. No mass, nipple discharge or skin change.  Abdominal:     General: Abdomen is flat.     Palpations: Abdomen is soft.     Tenderness: There is no abdominal tenderness.     Comments: Gravid   Genitourinary:    General: Normal vulva.     Exam position: Lithotomy position.     Pubic Area: No rash.      Labia:        Right: No rash.        Left: No rash.      Vagina: Normal. No vaginal discharge.     Cervix: No cervical motion tenderness or friability.     Uterus: Normal.  Enlarged (Unable to assess due to habitus). Not tender.      Adnexa: Right adnexa normal and left adnexa normal.     Rectum: Normal. No external hemorrhoid.  Musculoskeletal:     Right lower leg: No edema.     Left lower leg: No edema.  Lymphadenopathy:     Cervical: No cervical adenopathy.     Upper Body:     Right upper body: No axillary adenopathy.     Left upper body: No axillary adenopathy.  Skin:    General: Skin is warm.     Capillary Refill: Capillary refill takes less than 2 seconds.     Comments: Several scars noted on abdomen c/w prior surgeries  Neurological:     Mental Status: She is alert.     Assessment and Plan:  Pregnancy: G3P1011 at [redacted]w[redacted]d with EDC by LMP, confirmed by 09/14/23 [place] at [redacted]w[redacted]d. Happy about unexpected pregnancy "I didn't think I could get pregnant again after my surgeries, I have an 33 year old daughter."  1. [redacted] weeks gestation of pregnancy RV in 4 weeks.  2. Supervision of high-risk pregnancy, unspecified trimester (Primary) Routine prenatal labs today. EPDS screen score = 4, low risk. Supported smoking cessation decision. Plan fetal anatomy US at 19-20 weeks. - Prenatal Profile I - MaterniT 21 plus Core, Blood - IGP, Aptima HPV - Hemoglobin, fingerstick  3. Maternal morbid obesity, antepartum (HCC) Baseline obesity labs today. Recommend low-dose daily aspirin, MNT referral and weight gain of 11-20 lb - pt agrees to all. Plan anesthesia consult at 32 weeks, growth [place] every 4-6 weeks starting at 28 weeks and NST 2x/week after 36 weeks. - Glucose, 1 hour - Hgb A1c w/o eAG - Comprehensive metabolic panel - TSH - Protein / creatinine ratio, urine - Amb ref to Medical Nutrition Therapy-MNT - aspirin EC 81 MG tablet; Take 1 tablet (81 mg total) by mouth daily. Take after 12 weeks for prevention of preeclampsia later in pregnancy  4. Antepartum multigravida of advanced maternal age Recommend low-dose daily aspirin - pt accepts. Counseled re: increased  risk of aneuploidy. Offered genetic counseling referral/amniocentesis - pt declined both. Accepts NIPS today. Discussed ONTD testing via AFP when gestationally appropriate.   Discussed overview of care and coordination with inpatient delivery practices including  OB/GYN,  [facility].   Reviewed Centering pregnancy as standard of care at [facility], but pt unable to attend due to work schedule.  Preterm labor symptoms and general obstetric precautions including but not limited to vaginal bleeding, contractions, leaking of fluid and fetal movement were reviewed in detail with the patient.  Please refer to After Visit Summary for other counseling recommendations.   Return in about 4 weeks (around 11/12/2023).  Future Appointments  Date Time Provider Department Center  11/09/2023 10:00 AM [facility] PROVIDER [facility] None    Landry Dyke, PA-C

## 2023-10-15 NOTE — Progress Notes (Addendum)
 Presents for initiation of prenatal care. 08/2023 evaluated by [facility] for abd pain and was told had a threatened SAB. 09/04/23 had second opinion regarding above diagnosis at [facility] with 09/14/23 [place] indicating a viable pregnancy. Denies international travel / TB exposure. Desires MaterniT-21 testing today. Reports had flu vaccine in fall of 2024 at the [facility] in [place]. Agrees to look for record at home and bring for entry into [facility]. Denies asthma. Reports Albuterol inhaler / Azithromycin was for pneumonia treatment in February this year. Jossie Ng, RN Hgb = 11.7 and no intervention required per standing order. Jossie Ng, RN

## 2023-10-16 LAB — PROTEIN / CREATININE RATIO, URINE
Creatinine, Urine: 208 mg/dL
Protein, Ur: 20.6 mg/dL
Protein/Creat Ratio: 99 mg/g{creat} (ref 0–200)

## 2023-10-17 LAB — COMPREHENSIVE METABOLIC PANEL
ALT: 16 IU/L (ref 0–32)
AST: 14 IU/L (ref 0–40)
Albumin: 3.9 g/dL (ref 3.9–4.9)
Alkaline Phosphatase: 74 IU/L (ref 44–121)
BUN/Creatinine Ratio: 19 (ref 9–23)
BUN: 13 mg/dL (ref 6–20)
Bilirubin Total: <0.2 mg/dL (ref 0.0–1.2)
CO2: 21 mmol/L (ref 20–29)
Calcium: 8.9 mg/dL (ref 8.7–10.2)
Chloride: 102 mmol/L (ref 96–106)
Creatinine, Ser: 0.7 mg/dL (ref 0.57–1.00)
Globulin, Total: 2.3 g/dL (ref 1.5–4.5)
Glucose: 110 mg/dL — ABNORMAL HIGH (ref 70–99)
Potassium: 4.1 mmol/L (ref 3.5–5.2)
Sodium: 136 mmol/L (ref 134–144)
Total Protein: 6.2 g/dL (ref 6.0–8.5)
eGFR: 116 mL/min/{1.73_m2} (ref >59)

## 2023-10-17 LAB — PREGNANCY, INITIAL SCREEN
Antibody Screen: NEGATIVE
Basophils Absolute: 0 10*3/uL (ref 0.0–0.2)
Basos: 0 %
Bilirubin, UA: NEGATIVE
Chlamydia trachomatis, NAA: NEGATIVE
EOS (ABSOLUTE): 0.1 10*3/uL (ref 0.0–0.4)
Eos: 1 %
Glucose, UA: NEGATIVE
HCV Ab: NONREACTIVE
HIV Screen 4th Generation wRfx: NONREACTIVE
Hematocrit: 37.6 % (ref 34.0–46.6)
Hemoglobin: 12.2 g/dL (ref 11.1–15.9)
Hepatitis B Surface Ag: NEGATIVE
Immature Grans (Abs): 0 10*3/uL (ref 0.0–0.1)
Immature Granulocytes: 0 %
Leukocytes,UA: NEGATIVE
Lymphocytes Absolute: 2.5 10*3/uL (ref 0.7–3.1)
Lymphs: 23 %
MCH: 27.5 pg (ref 26.6–33.0)
MCHC: 32.4 g/dL (ref 31.5–35.7)
MCV: 85 fL (ref 79–97)
Monocytes Absolute: 0.6 10*3/uL (ref 0.1–0.9)
Monocytes: 5 %
Neisseria Gonorrhoeae by PCR: NEGATIVE
Neutrophils Absolute: 7.5 10*3/uL — ABNORMAL HIGH (ref 1.4–7.0)
Neutrophils: 71 %
Nitrite, UA: NEGATIVE
Platelets: 286 10*3/uL (ref 150–450)
RBC, UA: NEGATIVE
RBC: 4.44 x10E6/uL (ref 3.77–5.28)
RDW: 13.1 % (ref 11.7–15.4)
RPR Ser Ql: NONREACTIVE
Rh Factor: POSITIVE
Rubella Antibodies, IGG: 3.03 {index} (ref >0.99)
Specific Gravity, UA: >=1.03 — AB (ref 1.005–1.030)
Urobilinogen, Ur: 0.2 mg/dL (ref 0.2–1.0)
WBC: 10.6 10*3/uL (ref 3.4–10.8)
pH, UA: 6 (ref 5.0–7.5)

## 2023-10-17 LAB — GLUCOSE, 1 HOUR GESTATIONAL: Gestational Diabetes Screen: 102 mg/dL (ref 70–139)

## 2023-10-17 LAB — TSH: TSH: 1.58 u[IU]/mL (ref 0.450–4.500)

## 2023-10-17 LAB — HGB A1C W/O EAG: Hgb A1c MFr Bld: 5.7 % — ABNORMAL HIGH (ref 4.8–5.6)

## 2023-10-17 LAB — URINE CULTURE, OB REFLEX

## 2023-10-17 LAB — MICROSCOPIC EXAMINATION: Casts: NONE SEEN /LPF

## 2023-10-17 LAB — HCV INTERPRETATION

## 2023-10-19 LAB — MATERNIT 21 PLUS CORE, BLOOD
Fetal Fraction: 14
Result (T21): NEGATIVE
Trisomy 13 (Patau syndrome): NEGATIVE
Trisomy 18 (Edwards syndrome): NEGATIVE
Trisomy 21 (Down syndrome): NEGATIVE

## 2023-10-20 NOTE — Addendum Note (Signed)
 Addended by: Heywood Bene on: 10/20/2023 02:06 PM   Modules accepted: Orders

## 2023-10-28 LAB — IGP, APTIMA HPV
HPV Aptima: POSITIVE — AB
PAP Smear Comment: 0

## 2023-10-30 ENCOUNTER — Encounter: Payer: Self-pay | Admitting: Family Medicine

## 2023-11-05 ENCOUNTER — Emergency Department

## 2023-11-05 ENCOUNTER — Emergency Department
Admission: EM | Admit: 2023-11-05 | Discharge: 2023-11-05 | Disposition: A | Discharge status: Home / Self Care | Attending: Emergency Medicine | Admitting: Emergency Medicine

## 2023-11-05 ENCOUNTER — Other Ambulatory Visit: Payer: Self-pay

## 2023-11-05 DIAGNOSIS — D72829 Elevated white blood cell count, unspecified: Secondary | ICD-10-CM | POA: Insufficient documentation

## 2023-11-05 DIAGNOSIS — R102 Pelvic and perineal pain: Secondary | ICD-10-CM | POA: Insufficient documentation

## 2023-11-05 DIAGNOSIS — O26892 Other specified pregnancy related conditions, second trimester: Secondary | ICD-10-CM | POA: Insufficient documentation

## 2023-11-05 DIAGNOSIS — Z3A16 16 weeks gestation of pregnancy: Secondary | ICD-10-CM | POA: Diagnosis not present

## 2023-11-05 LAB — RESP PANEL BY RT-PCR (RSV, FLU A&B, COVID)  RVPGX2
Influenza A by PCR: NEGATIVE
Influenza B by PCR: NEGATIVE
Resp Syncytial Virus by PCR: NEGATIVE
SARS Coronavirus 2 by RT PCR: NEGATIVE

## 2023-11-05 LAB — CBC
HCT: 35.6 % — ABNORMAL LOW (ref 36.0–46.0)
Hemoglobin: 11.7 g/dL — ABNORMAL LOW (ref 12.0–15.0)
MCH: 27.2 pg (ref 26.0–34.0)
MCHC: 32.9 g/dL (ref 30.0–36.0)
MCV: 82.8 fL (ref 80.0–100.0)
Platelets: 255 10*3/uL (ref 150–400)
RBC: 4.3 MIL/uL (ref 3.87–5.11)
RDW: 13.1 % (ref 11.5–15.5)
WBC: 12.1 10*3/uL — ABNORMAL HIGH (ref 4.0–10.5)
nRBC: 0 % (ref 0.0–0.2)

## 2023-11-05 LAB — URINALYSIS, ROUTINE W REFLEX MICROSCOPIC
Bilirubin Urine: NEGATIVE
Glucose, UA: NEGATIVE mg/dL
Hgb urine dipstick: NEGATIVE
Ketones, ur: NEGATIVE mg/dL
Leukocytes,Ua: NEGATIVE
Nitrite: NEGATIVE
Protein, ur: NEGATIVE mg/dL
Specific Gravity, Urine: 1.027 (ref 1.005–1.030)
pH: 6 (ref 5.0–8.0)

## 2023-11-05 LAB — COMPREHENSIVE METABOLIC PANEL WITH GFR
ALT: 21 U/L (ref 0–44)
AST: 16 U/L (ref 15–41)
Albumin: 3.4 g/dL — ABNORMAL LOW (ref 3.5–5.0)
Alkaline Phosphatase: 54 U/L (ref 38–126)
Anion gap: 8 (ref 5–15)
BUN: 17 mg/dL (ref 6–20)
CO2: 22 mmol/L (ref 22–32)
Calcium: 9.2 mg/dL (ref 8.9–10.3)
Chloride: 103 mmol/L (ref 98–111)
Creatinine, Ser: 0.76 mg/dL (ref 0.44–1.00)
GFR, Estimated: >60 mL/min (ref >60)
Glucose, Bld: 116 mg/dL — ABNORMAL HIGH (ref 70–99)
Potassium: 4 mmol/L (ref 3.5–5.1)
Sodium: 133 mmol/L — ABNORMAL LOW (ref 135–145)
Total Bilirubin: 0.3 mg/dL (ref 0.0–1.2)
Total Protein: 6.9 g/dL (ref 6.5–8.1)

## 2023-11-05 LAB — LIPASE, BLOOD: Lipase: 41 U/L (ref 11–51)

## 2023-11-05 LAB — HCG, QUANTITATIVE, PREGNANCY: hCG, Beta Chain, Quant, S: 38280 m[IU]/mL — ABNORMAL HIGH (ref <5)

## 2023-11-05 MED ORDER — ACETAMINOPHEN 500 MG PO TABS
1000.0000 mg | ORAL_TABLET | Freq: Once | ORAL | Status: AC
  Administered 2023-11-05: 1000 mg via ORAL
  Filled 2023-11-05: qty 2

## 2023-11-05 NOTE — ED Provider Notes (Signed)
 [facility] Provider Note    Event Date/Time   First MD Initiated Contact with Patient 11/05/23 1839     (approximate)   History   Abdominal Pain   HPI  Melanie [name] is a 38 y.o. female with history of anemia, is [redacted] weeks pregnant, with a second pregnancy, presenting with lower abdominal pressure.  No urinary symptoms, states that she has some morning sickness but is not nauseous right now.  No diarrhea.  States that the lower abdominal pain radiates to her bilateral upper thighs.  She denies any back pain.  No trauma or falls.  She denies any vaginal bleeding or gush of fluid.  States that she is on a baby aspirin.  Has not taken other medications for pain.  States that she has follow-up with her OB on Monday.  On independent chart review, she has prior history of cholecystectomy, salpingectomy, ovarian cyst removal.  She was also seen by her OB on 13 March for prenatal visit, is currently being monitored for high risk pregnancy,     Physical Exam   Triage Vital Signs: ED Triage Vitals  Encounter Vitals Group     BP 11/05/23 1516 116/66     Systolic BP Percentile --      Diastolic BP Percentile --      Pulse Rate 11/05/23 1516 81     Resp 11/05/23 1516 18     Temp 11/05/23 1516 98.7 F (37.1 C)     Temp Source 11/05/23 1516 Oral     SpO2 11/05/23 1516 99 %     Weight 11/05/23 1521 291 lb (132 kg)     Height 11/05/23 1521 5\' 5"  (1.651 m)     Head Circumference --      Peak Flow --      Pain Score 11/05/23 1521 10     Pain Loc --      Pain Education --      Exclude from Growth Chart --     Most recent vital signs: Vitals:   11/05/23 1516  BP: 116/66  Pulse: 81  Resp: 18  Temp: 98.7 F (37.1 C)  SpO2: 99%     General: Awake, no distress.  CV:  Good peripheral perfusion.  Resp:  Normal effort.  Abd:  Soft, mildly distended, nontender Other:  No CVA tenderness bilaterally, no focal weakness or numbness.  No lower  extremity edema.  She has steady ambulation.   ED Results / Procedures / Treatments   Labs (all labs ordered are listed, but only abnormal results are displayed) Labs Reviewed  COMPREHENSIVE METABOLIC PANEL WITH GFR - Abnormal; Notable for the following components:      Result Value   Sodium 133 (*)    Glucose, Bld 116 (*)    Albumin 3.4 (*)    All other components within normal limits  CBC - Abnormal; Notable for the following components:   WBC 12.1 (*)    Hemoglobin 11.7 (*)    HCT 35.6 (*)    All other components within normal limits  URINALYSIS, ROUTINE W REFLEX MICROSCOPIC - Abnormal; Notable for the following components:   Color, Urine YELLOW (*)    APPearance HAZY (*)    All other components within normal limits  HCG, QUANTITATIVE, PREGNANCY - Abnormal; Notable for the following components:   hCG, Beta Chain, Quant, S 38,280 (*)    All other components within normal limits  RESP PANEL BY RT-PCR (RSV, FLU  A&B, COVID)  RVPGX2  LIPASE, BLOOD    RADIOLOGY Ultrasound on my independent interpretation shows live IUP.   PROCEDURES:  Critical Care performed: No  Procedures   MEDICATIONS ORDERED IN ED: Medications  acetaminophen (TYLENOL) tablet 1,000 mg (1,000 mg Oral Given 11/05/23 1913)     IMPRESSION / MDM / ASSESSMENT AND PLAN / ED COURSE  I reviewed the triage vital signs and the nursing notes.                              Differential diagnosis includes, but is not limited to, sciatica, pelvic congestion, pelvic floor strain, UTI, no abdominal pain on exam to suggest appendicitis or colitis, also considered threatened miscarriage although patient states that her abdominal pain does not feel like contractions.  Get labs, UA, ultrasound.  Will give her some Tylenol.  Patient's presentation is most consistent with acute presentation with potential threat to life or bodily function.  Independent review of labs and imaging are below.  Discussed imaging and lab  results with patient.  She has no nausea at this time.  Shared decision making done with patient and she is agreeable plan for discharge, will see her OB on Monday.  Recommended Tylenol every 6 hours as needed for pain.  Considered but no indication for inpatient admission at this time, she is safe for outpatient management.  Discharged with strict precautions.  Clinical Course as of 11/05/23 2025  Thu Nov 05, 2023  8469 Independent review of labs, HCG is elevated, electrolytes not severely deranged, mild leukocytosis, UA is negative for UTI. [TT]  1936 US OB Limited 1. Single live intrauterine pregnancy as above, estimated age 63 weeks and 3 days.  This exam is performed on an emergent basis and does not comprehensively evaluate fetal size, dating, or anatomy; follow-up complete OB US should be considered if further fetal assessment is warranted.   [TT]    Clinical Course User Index [TT] Jodie Echevaria, Franchot Erichsen, MD     FINAL CLINICAL IMPRESSION(S) / ED DIAGNOSES   Final diagnoses:  Pelvic pain  [redacted] weeks gestation of pregnancy     Rx / DC Orders   ED Discharge Orders     None        Note:  This document was prepared using Dragon voice recognition software and may include unintentional dictation errors.    Claybon Jabs, MD 11/05/23 2027

## 2023-11-05 NOTE — ED Notes (Signed)
 See triage notes. Patient c/o cramping in her groin and down into her thighs. Patient is fifteen weeks pregnant

## 2023-11-05 NOTE — Discharge Instructions (Signed)
 Please go to your OB/GYN's appointment on Monday.  You can take 650 mg of Tylenol be 6 hours as needed for pain.  You can also try an abdominal binder.  Please return if the pain is persistent or worsening, if you have leg swelling, difficulty breathing, chest pain, or if you have any additional concerns.

## 2023-11-05 NOTE — ED Triage Notes (Signed)
 Patient arrives POV c/o lower abdominal pain that started last night. Patient states she is ~ [redacted] weeks pregnant. Patient denies any vaginal bleeding; reports n/v d/t pregnancy. Patient states pain is 10/10. EDP Myah Romeo Apple assessing patient during triage.

## 2023-11-05 NOTE — ED Provider Triage Note (Signed)
 Emergency Medicine Provider Triage Evaluation Note  Melanie [name] , a 38 y.o. female  was evaluated in triage.  Pt complains of sudden severe lower pelvic pain and generalized abdominal discomfort with onset of yesterday.  Patient is approximately [redacted] weeks pregnant.  She continues to feel flutters of the fetus.  She denies nausea and vomiting.  No vaginal bleeding. Hx of fallopian tube removal due to cyst removal.   Review of Systems  Positive:  Negative:   Physical Exam  BP 116/66 (BP Location: Left Arm)   Pulse 81   Temp 98.7 F (37.1 C) (Oral)   Resp 18   LMP 07/21/2023 (Exact Date)   SpO2 99%  Gen:   Awake, no distress  tearful Resp:  Normal effort  MSK:   Moves extremities without difficulty  Other:  Central lower pelvic tenderness with palpation. Mild epigastric tenderness.   Medical Decision Making  Medically screening exam initiated at 3:21 PM.  Appropriate orders placed.  Melanie [name] was informed that the remainder of the evaluation will be completed by another provider, this initial triage assessment does not replace that evaluation, and the importance of remaining in the ED until their evaluation is complete.    Romeo Apple, Daren Doswell A, PA-C 11/05/23 1525

## 2023-11-09 ENCOUNTER — Encounter: Payer: Self-pay | Admitting: Family Medicine

## 2023-11-09 ENCOUNTER — Ambulatory Visit: Admitting: Family Medicine

## 2023-11-09 VITALS — BP 119/75 | HR 80 | Temp 97.4°F | Wt 294.6 lb

## 2023-11-09 DIAGNOSIS — O99212 Obesity complicating pregnancy, second trimester: Secondary | ICD-10-CM

## 2023-11-09 DIAGNOSIS — O169 Unspecified maternal hypertension, unspecified trimester: Secondary | ICD-10-CM | POA: Insufficient documentation

## 2023-11-09 DIAGNOSIS — O0990 Supervision of high risk pregnancy, unspecified, unspecified trimester: Secondary | ICD-10-CM

## 2023-11-09 DIAGNOSIS — Z3A16 16 weeks gestation of pregnancy: Secondary | ICD-10-CM

## 2023-11-09 DIAGNOSIS — O0992 Supervision of high risk pregnancy, unspecified, second trimester: Secondary | ICD-10-CM

## 2023-11-09 HISTORY — DX: Unspecified maternal hypertension, unspecified trimester: O16.9

## 2023-11-09 NOTE — Progress Notes (Addendum)
 Here today for 16.2 week MH RV. Taking PNV and ASA every day. Forgot to bring documentation of Flu vaccine. Went to [facility] ED 11/06/23 for pelvic pain. Ultrasound done in ED. Complains of "standing in the same place for 8 hours at work". BP 147/75. Will recheck in 15 minutes. Wants AFP today. Tawny Hopping, RN

## 2023-11-09 NOTE — Progress Notes (Signed)
  [facility] [address] [place] [address] Main phone: [phone]  Prenatal Visit  Subjective:  Melanie [name] is a 38 y.o. G3P1011 at [redacted]w[redacted]d being seen today for ongoing prenatal care.  She is currently monitored for the following issues for this high-risk pregnancy:   Patient Active Problem List   Diagnosis Date Noted   Elevated blood pressure affecting pregnancy, antepartum 11/09/2023   Supervision of high-risk pregnancy, unspecified trimester 10/15/2023   Antepartum multigravida of advanced maternal age - 81 at [facility] 10/15/2023   Maternal morbid obesity, antepartum (HCC) Pregrav BMI 46.6 10/15/2023   Patient reports backache.  Contractions: Not present. Vag. Bleeding: None.  Movement: Present. Denies leaking of fluid/ROM.   The following portions of the patient's history were reviewed and updated as appropriate: allergies, current medications, past family history, past medical history, past social history, past surgical history and problem list. Problem list updated.  Objective:   Vitals:   11/09/23 0935 11/09/23 1000 11/09/23 1013  BP: (!) 147/75 119/75 119/75  Pulse: 87 80   Temp: (!) 97.4 F (36.3 C)    Weight: 294 lb 9.6 oz (133.6 kg)      Fetal Status: Fetal Heart Rate (bpm): 146 Fundal Height:  (unable to palpate due to habitus) Movement: Present     General:  Alert, oriented and cooperative. Patient is in no acute distress.  Skin: Skin is warm and dry. No rash noted.   Cardiovascular: Normal heart rate noted  Respiratory: Normal respiratory effort, no problems with respiration noted  Abdomen: Soft, gravid, appropriate for gestational age.  Pain/Pressure: Present     Pelvic: Cervical exam deferred        Extremities: Normal range of motion.  Edema: None  Mental Status: Normal mood and affect. Normal behavior. Normal judgment and thought content.   Assessment and Plan:   Pregnancy: G3P1011 at [redacted]w[redacted]d  1. [redacted] weeks gestation of pregnancy -AFP today -ordered anatomy US to MFM   - [place] MFM OB DETAIL +14 WK; Future  2. Supervision of high-risk pregnancy, unspecified trimester (Primary) -taking PNV daily -note given for work today for stool to use during hours at work for rest  3. Maternal morbid obesity, antepartum (HCC) Pregrav BMI 46.6 -taking ASA daily 14 lb 9.6 oz (6.623 kg) -exercise-doing some stretching two times a day -reports feeling immense pain when standing- given stretching sheet today -encouraged to walk or possibly try swimming for relief    4. Elevated blood pressure affecting pregnancy, antepartum -initial BP today 147/75- repeat after 15 minutes 119/75 -pt reports no hx of gHTN with other pregnancies    Preterm labor symptoms and general obstetric precautions including but not limited to vaginal bleeding, contractions, leaking of fluid and fetal movement were reviewed in detail with the patient. Please refer to After Visit Summary for other counseling recommendations.  Return in about 4 weeks (around 12/07/2023) for Routine Prenatal Care.  Future Appointments  Date Time Provider Department Center  12/16/2023  8:00 AM [facility] NURSE INTAKE [facility] None  12/21/2023  1:00 PM [facility]1 [facility]    Lenice Llamas, [place]

## 2023-11-11 ENCOUNTER — Encounter: Payer: Self-pay | Admitting: Family Medicine

## 2023-11-11 LAB — AFP, SERUM, OPEN SPINA BIFIDA
AFP MoM: 1.28
AFP Value: 32.5 ng/mL
Gest. Age on Collection Date: 16.2 wk
Maternal Age At EDD: 36.1 a
OSBR Risk 1 IN: 10000
Test Results:: NEGATIVE
Weight: 294 [lb_av]

## 2023-12-07 ENCOUNTER — Ambulatory Visit: Admitting: Nurse Practitioner

## 2023-12-07 ENCOUNTER — Encounter: Payer: Self-pay | Admitting: Nurse Practitioner

## 2023-12-07 VITALS — BP 91/55 | HR 70 | Temp 98.4°F | Wt 297.4 lb

## 2023-12-07 DIAGNOSIS — Z3A20 20 weeks gestation of pregnancy: Secondary | ICD-10-CM

## 2023-12-07 DIAGNOSIS — O169 Unspecified maternal hypertension, unspecified trimester: Secondary | ICD-10-CM

## 2023-12-07 DIAGNOSIS — O0992 Supervision of high risk pregnancy, unspecified, second trimester: Secondary | ICD-10-CM

## 2023-12-07 DIAGNOSIS — O0990 Supervision of high risk pregnancy, unspecified, unspecified trimester: Secondary | ICD-10-CM

## 2023-12-07 DIAGNOSIS — O09522 Supervision of elderly multigravida, second trimester: Secondary | ICD-10-CM

## 2023-12-07 DIAGNOSIS — O99212 Obesity complicating pregnancy, second trimester: Secondary | ICD-10-CM

## 2023-12-07 DIAGNOSIS — O99210 Obesity complicating pregnancy, unspecified trimester: Secondary | ICD-10-CM

## 2023-12-07 DIAGNOSIS — O09529 Supervision of elderly multigravida, unspecified trimester: Secondary | ICD-10-CM

## 2023-12-07 NOTE — Progress Notes (Deleted)
 Here today for 20.2 week MH RV. Taking PNV

## 2023-12-07 NOTE — Progress Notes (Signed)
 Here today for 20.2 week Mh RV. Taking PNV and ASA every day. Denies ED/hospital visits since last RV. Aware of scheduled [facility] ultrasound for 12/21/23 @ 1:00. Eden Goodpasture, RN

## 2023-12-07 NOTE — Progress Notes (Signed)
  [facility] [address], [address] [place] [address] Main phone: [phone]  Prenatal Visit  Subjective:  Melanie [name] is a 38 y.o. G3P1011 at [redacted]w[redacted]d being seen today for ongoing prenatal care.  She is currently monitored for the following issues for this low-risk pregnancy:   Patient Active Problem List   Diagnosis Date Noted   Elevated blood pressure affecting pregnancy, antepartum 11/09/2023   Supervision of high-risk pregnancy, unspecified trimester 10/15/2023   Antepartum multigravida of advanced maternal age - 11 at [facility] 10/15/2023   Maternal morbid obesity, antepartum (HCC) Pregrav BMI 46.6 10/15/2023   Patient reports  round ligament pain and sore feet .  Contractions: Not present. Vag. Bleeding: None.  Movement: Present. Denies leaking of fluid/ROM. She reports that her job at [employer] has ignored the pregnancy work restrictions we gave her last visit. She tried a maternity support belt, but it was uncomfortable. She is getting up every hour at night to void.  The following portions of the patient's history were reviewed and updated as appropriate: allergies, current medications, past family history, past medical history, past social history, past surgical history and problem list. Problem list updated.  Objective:   Vitals:   12/07/23 0847  BP: (!) 91/55  Pulse: 70  Temp: 98.4 F (36.9 C)  Weight: 297 lb 6.4 oz (134.9 kg)    Fetal Status: Fetal Heart Rate (bpm): 141 Fundal Height: 20 cm Movement: Present     General:  Alert, oriented and cooperative. Patient is in no acute distress.  Skin: Skin is warm and dry. No rash noted.   Cardiovascular: Normal heart rate noted  Respiratory: Normal respiratory effort, no problems with respiration noted  Abdomen: Soft, gravid, appropriate for gestational age.  Pain/Pressure: Present     Pelvic: Cervical exam deferred        Extremities: Normal  range of motion.  Edema: None  Mental Status: Normal mood and affect. Normal behavior. Normal judgment and thought content.   Assessment and Plan:  Pregnancy: G3P1011 at [redacted]w[redacted]d  1. Supervision of high-risk pregnancy, unspecified trimester (Primary) Discussed wearing compression socks at work and taking frequent breaks.  Discussed BC. Pt does not desire any future pregnancies, but her partner does. Discussed BTL or IUD. Pt is still deciding.  2. Maternal morbid obesity, antepartum (HCC) Pregrav BMI 46.6 17 lb 6.4 oz (7.893 kg) Gained 3 lbs in 4 weeks  To start serial growth US  at 28 weeks q4-6 weeks  3. Antepartum multigravida of advanced maternal age - 68 at [facility] US  appt sch on 5/19  4. Elevated blood pressure affecting pregnancy, antepartum BP WNL today Taking ASA  5. [redacted] weeks gestation of pregnancy    Preterm labor symptoms and general obstetric precautions including but not limited to vaginal bleeding, contractions, leaking of fluid and fetal movement were reviewed in detail with the patient. Please refer to After Visit Summary for other counseling recommendations.  Return in about 4 weeks (around 01/04/2024) for Routine PNC.  Future Appointments  Date Time Provider Department Center  12/21/2023  1:00 PM [facility]    Dorna Gasman, NP

## 2023-12-16 ENCOUNTER — Ambulatory Visit

## 2023-12-21 ENCOUNTER — Ambulatory Visit: Attending: Family Medicine

## 2023-12-21 ENCOUNTER — Ambulatory Visit: Admitting: Maternal & Fetal Medicine

## 2023-12-21 ENCOUNTER — Other Ambulatory Visit: Payer: Self-pay

## 2023-12-21 VITALS — BP 116/58 | HR 73

## 2023-12-21 DIAGNOSIS — O99210 Obesity complicating pregnancy, unspecified trimester: Secondary | ICD-10-CM

## 2023-12-21 DIAGNOSIS — Z7982 Long term (current) use of aspirin: Secondary | ICD-10-CM | POA: Insufficient documentation

## 2023-12-21 DIAGNOSIS — O0990 Supervision of high risk pregnancy, unspecified, unspecified trimester: Secondary | ICD-10-CM

## 2023-12-21 DIAGNOSIS — O09522 Supervision of elderly multigravida, second trimester: Secondary | ICD-10-CM | POA: Diagnosis not present

## 2023-12-21 DIAGNOSIS — Z3A22 22 weeks gestation of pregnancy: Secondary | ICD-10-CM

## 2023-12-21 DIAGNOSIS — O99212 Obesity complicating pregnancy, second trimester: Secondary | ICD-10-CM | POA: Insufficient documentation

## 2023-12-21 DIAGNOSIS — Z3689 Encounter for other specified antenatal screening: Secondary | ICD-10-CM | POA: Diagnosis not present

## 2023-12-21 DIAGNOSIS — Z9079 Acquired absence of other genital organ(s): Secondary | ICD-10-CM | POA: Insufficient documentation

## 2023-12-21 DIAGNOSIS — Z3A21 21 weeks gestation of pregnancy: Secondary | ICD-10-CM

## 2023-12-21 DIAGNOSIS — E669 Obesity, unspecified: Secondary | ICD-10-CM | POA: Diagnosis not present

## 2023-12-21 DIAGNOSIS — Z3A16 16 weeks gestation of pregnancy: Secondary | ICD-10-CM | POA: Diagnosis not present

## 2023-12-21 DIAGNOSIS — O09529 Supervision of elderly multigravida, unspecified trimester: Secondary | ICD-10-CM

## 2023-12-21 NOTE — Progress Notes (Signed)
 Patient information  Patient Name: Melanie [name]  Patient MRN:   [id number]  Referring practice: MFM Referring Provider: [facility] Department  MFM CONSULT  Melanie [name] is a 38 y.o. G3P1011 at [redacted]w[redacted]d here for ultrasound and consultation. Patient Active Problem List   Diagnosis Date Noted   Elevated blood pressure affecting pregnancy, antepartum 11/09/2023   Supervision of high-risk pregnancy, unspecified trimester 10/15/2023   Antepartum multigravida of advanced maternal age - 82 at [facility] 10/15/2023   Maternal morbid obesity, antepartum (HCC) Pregrav BMI 46.6 10/15/2023    Melanie [name] is doing well today with no acute concerns.  Elevated BMI I discussed the potential complications associated with obesity in pregnancy.  These complications include but are not limited to increased risk of excessive maternal weight gain, fetal growth abnormalities, fetal congenital disorders, inability to visualize fetal anatomic structures on ultrasound, gestational diabetes, hypertensive disorders of pregnancy, operative birth including cesarean delivery or assisted vaginal delivery, delayed wound healing and many long-term health complications.  I discussed the need for continued growth ultrasounds and possibly antenatal testing depending upon how the pregnancy course progresses.  Maternal weight gain should be limited to 10 to 20 pounds during the pregnancy.  While normal weight loss may occur during the first and early second trimester, efforts to actively lose weight with the use of medication is not recommended during pregnancy.  A whole food diet and regular exercise of at least 15 to 30 minutes of moderately strenuous activity is recommended in the absence of any contraindications. Weight loss with the use of medications is not recommended during pregnancy.  If other existing comorbidities are present then 81 mg of aspirin  should be considered for  preeclampsia risk reduction.   Sonographic findings Single intrauterine pregnancy at 21w 6d  Fetal cardiac activity:  Observed and appears normal. Presentation: Variable. The anatomic structures that were well seen appear normal without evidence of soft markers. Due to poor acoustic windows some structures remain suboptimally visualized. Fetal biometry shows the estimated fetal weight at the 82 percentile.  Amniotic fluid: Within normal limits.  MVP: 7.41 cm. Placenta: Posterior. Adnexa: No abnormality visualized. Cervical length: 3.6 cm.  There are limitations of prenatal ultrasound such as the inability to detect certain abnormalities due to poor visualization. Various factors such as fetal position, gestational age and maternal body habitus may increase the difficulty in visualizing the fetal anatomy.    Recommendations -EDD should be 04/26/2024 based on  LMP  (07/21/23).  -Detailed ultrasound was done today without abnormalities. -Baseline preeclampsia labs: CMP, CBC and urine protein/creatinine ratio if not previously completed.  -Early glucose screening due to multiple risk factors. -Continue Aspirin  81 mg for preeclampsia prophylaxis -Follow-up anatomy and fetal growth in 4 to 6 weeks -Serial growth ultrasounds starting around 28 weeks to monitor for fetal growth restriction -Antenatal testing to start around 34 weeks due to the increased risk of stillbirth and high risk pregnancy -Delivery timing pending clinical course but likely around [redacted] weeks gestation -Continue routine prenatal care with referring OB provider  Review of Systems: A review of systems was performed and was negative except per HPI   Vitals and Physical Exam    12/21/2023   12:51 PM 12/07/2023    8:47 AM 11/09/2023   10:13 AM  Vitals with BMI  Weight  297 lbs 6 oz   Systolic 116 91 119  Diastolic 58 55 75  Pulse 73 70     Sitting comfortably on the  sonogram table Nonlabored breathing Normal rate and  rhythm Abdomen is nontender  Past pregnancies OB History  Gravida Para Term Preterm AB Living  3 1 1  0 1 1  SAB IAB Ectopic Multiple Live Births  0 1 0 0 1    # Outcome Date GA Lbr Len/2nd Weight Sex Type Anes PTL Lv  3 Current           2 Term 06/10/15 [redacted]w[redacted]d  3572 g F Vag-Spont   LIV  1 IAB 2009            Obstetric Comments  Being see @ [facility] due to "High Risk" pregnancy - weight related. [name], [name]     I spent 30 minutes reviewing the patients chart, including labs and images as well as counseling the patient about her medical conditions. Greater than 50% of the time was spent in direct face-to-face patient counseling.  Penney Bowling, DO Maternal fetal medicine,    12/21/2023  3:48 PM

## 2023-12-22 ENCOUNTER — Ambulatory Visit: Payer: Self-pay | Admitting: Family Medicine

## 2024-01-04 ENCOUNTER — Ambulatory Visit

## 2024-01-04 ENCOUNTER — Ambulatory Visit: Admitting: Family Medicine

## 2024-01-04 VITALS — BP 109/71 | HR 74 | Temp 98.9°F | Wt 298.6 lb

## 2024-01-04 DIAGNOSIS — O169 Unspecified maternal hypertension, unspecified trimester: Secondary | ICD-10-CM

## 2024-01-04 DIAGNOSIS — R87618 Other abnormal cytological findings on specimens from cervix uteri: Secondary | ICD-10-CM

## 2024-01-04 DIAGNOSIS — O0992 Supervision of high risk pregnancy, unspecified, second trimester: Secondary | ICD-10-CM

## 2024-01-04 DIAGNOSIS — O09522 Supervision of elderly multigravida, second trimester: Secondary | ICD-10-CM

## 2024-01-04 DIAGNOSIS — R87619 Unspecified abnormal cytological findings in specimens from cervix uteri: Secondary | ICD-10-CM | POA: Insufficient documentation

## 2024-01-04 DIAGNOSIS — O99212 Obesity complicating pregnancy, second trimester: Secondary | ICD-10-CM

## 2024-01-04 DIAGNOSIS — O09529 Supervision of elderly multigravida, unspecified trimester: Secondary | ICD-10-CM

## 2024-01-04 DIAGNOSIS — O0990 Supervision of high risk pregnancy, unspecified, unspecified trimester: Secondary | ICD-10-CM

## 2024-01-04 DIAGNOSIS — Z3A23 23 weeks gestation of pregnancy: Secondary | ICD-10-CM

## 2024-01-04 NOTE — Progress Notes (Signed)
 Remains undecided as to post-partum BCM - verified has resource previously given. Unable to locate proof of influenza vaccine at [facility] fall 2024. Kept MFM ([facility]) US  appt 12/21/23 and aware of follow-up US  appt 01/22/24 at 1400. Ariel Begun, RN

## 2024-01-04 NOTE — Progress Notes (Addendum)
  [facility] [address], [address] [place] [address] Main phone: [phone]  Prenatal Visit  Subjective:  Melanie [name] is a 38 y.o. G3P1011 at [redacted]w[redacted]d being seen today for ongoing prenatal care.  She is currently monitored for the following issues for this high-risk pregnancy:   Patient Active Problem List   Diagnosis Date Noted   Abnormal Pap smear of cervix 01/04/2024   Elevated blood pressure affecting pregnancy, antepartum 11/09/2023   Supervision of high-risk pregnancy, unspecified trimester 10/15/2023   Antepartum multigravida of advanced maternal age - 28 at [facility] 10/15/2023   Maternal morbid obesity, antepartum (HCC) Pregrav BMI 46.6 10/15/2023   Patient reports pelvic pain.  Contractions: Not present. Vag. Bleeding: None.  Movement: Present. Denies leaking of fluid/ROM.   The following portions of the patient's history were reviewed and updated as appropriate: allergies, current medications, past family history, past medical history, past social history, past surgical history and problem list. Problem list updated.  Objective:   Vitals:   01/04/24 1532  BP: 109/71  Pulse: 74  Temp: 98.9 F (37.2 C)  Weight: 298 lb 9.6 oz (135.4 kg)    Fetal Status: Fetal Heart Rate (bpm): 145 Fundal Height: 26 cm Movement: Present     General:  Alert, oriented and cooperative. Patient is in no acute distress.  Skin: Skin is warm and dry. No rash noted.   Cardiovascular: Normal heart rate noted  Respiratory: Normal respiratory effort, no problems with respiration noted  Abdomen: Soft, gravid, appropriate for gestational age.  Pain/Pressure: Present     Pelvic: Cervical exam deferred        Extremities: Normal range of motion.  Edema: None  Mental Status: Normal mood and affect. Normal behavior. Normal judgment and thought content.   Assessment and Plan:  Pregnancy: G3P1011 at [redacted]w[redacted]d  1. [redacted] weeks  gestation of pregnancy (Primary)   2. Supervision of high-risk pregnancy, unspecified trimester -taking PNV daily   3. Maternal morbid obesity, antepartum (HCC) Pregrav BMI 46.6 -seen by [facility] -repeat growth US  at 28 weeks -antenatal testing to start at 34 weeks FH>dates today- EFW on 5/19= 82%-continue with growth scans for more accurate measurements  18 lb 9.6 oz (8.437 kg)  4. Antepartum multigravida of advanced maternal age - 21 at [facility] -taking ASA daily -NIPS and AFP negative -declined genetic counseling  5. Elevated blood pressure affecting pregnancy, antepartum -BP wnl today   6. Other abnormal cytological finding of specimen from cervix -repeat pap due 10/14/24   Preterm labor symptoms and general obstetric precautions including but not limited to vaginal bleeding, contractions, leaking of fluid and fetal movement were reviewed in detail with the patient. Please refer to After Visit Summary for other counseling recommendations.  No follow-ups on file.  Future Appointments  Date Time Provider Department Center  02/01/2024  2:00 PM [facility]    [place], [place]

## 2024-01-20 NOTE — Procedures (Signed)
-------------------------------------------------------------------------------   Attestation signed by Zahn, Katelin Marie, MD at 01/21/24 [phone] Attending Physician Attestation:  I have personally reviewed the fetal non stress test and agree with the documentation.  Katelin M Zahn, MD 01/21/24  1:01 AM   -------------------------------------------------------------------------------  NST Interpretation Indication: Uterine contractions: O47.9   Baseline: 140 bpm Variability: moderate Accelerations: present Decelerations: variable, subtle c/w GA Contractions: none Time noted:  See OBIX Impression: reactive Authenticated by: Kevin A Tam, MD   Kevin A Tam, MD 01/20/24  7:49 PM

## 2024-01-21 NOTE — Progress Notes (Signed)
 Vaginitis Molecular Panel Order: [id number]  Status: Final result   Test Result Released: Yes (not seen)   Specimen Information: Clinician-collected Vaginal Swab  View Follow-Up Encounter <redacted file path>    Component Ref Range & Units (hover)   Bacterial Vaginitis Positive Abnormal   Candida group NAAT Not Detected  Candida glabrata/Candida krusei NAAT Not Detected  Trichomonas NAAT Not Detected  Resulting Agency [facility] <redacted file path>        Pt tested positive for BV. Transferred to [facility] L&D for obs where she was started on metronidazole  500mg  2 times a day for 7 days for +BV. No further action required.

## 2024-02-01 ENCOUNTER — Ambulatory Visit (HOSPITAL_BASED_OUTPATIENT_CLINIC_OR_DEPARTMENT_OTHER): Admitting: Maternal & Fetal Medicine

## 2024-02-01 ENCOUNTER — Other Ambulatory Visit: Payer: Self-pay

## 2024-02-01 ENCOUNTER — Ambulatory Visit: Attending: Maternal & Fetal Medicine

## 2024-02-01 ENCOUNTER — Ambulatory Visit

## 2024-02-01 VITALS — BP 111/61 | HR 72

## 2024-02-01 DIAGNOSIS — Z3A27 27 weeks gestation of pregnancy: Secondary | ICD-10-CM | POA: Diagnosis not present

## 2024-02-01 DIAGNOSIS — O99212 Obesity complicating pregnancy, second trimester: Secondary | ICD-10-CM | POA: Diagnosis present

## 2024-02-01 DIAGNOSIS — O169 Unspecified maternal hypertension, unspecified trimester: Secondary | ICD-10-CM

## 2024-02-01 DIAGNOSIS — O09522 Supervision of elderly multigravida, second trimester: Secondary | ICD-10-CM | POA: Diagnosis not present

## 2024-02-01 DIAGNOSIS — Z9079 Acquired absence of other genital organ(s): Secondary | ICD-10-CM | POA: Insufficient documentation

## 2024-02-01 DIAGNOSIS — Z362 Encounter for other antenatal screening follow-up: Secondary | ICD-10-CM | POA: Diagnosis present

## 2024-02-01 DIAGNOSIS — O0990 Supervision of high risk pregnancy, unspecified, unspecified trimester: Secondary | ICD-10-CM

## 2024-02-01 DIAGNOSIS — Z906 Acquired absence of other parts of urinary tract: Secondary | ICD-10-CM | POA: Insufficient documentation

## 2024-02-01 DIAGNOSIS — O09529 Supervision of elderly multigravida, unspecified trimester: Secondary | ICD-10-CM

## 2024-02-01 NOTE — Progress Notes (Signed)
   Patient information  Patient Name: Melanie [name]  Patient MRN:   [id number]  Referring practice: MFM Referring Provider: [facility] Department  Problem List   Patient Active Problem List   Diagnosis Date Noted   Abnormal Pap smear of cervix 01/04/2024   Elevated blood pressure affecting pregnancy, antepartum 11/09/2023   Supervision of high-risk pregnancy, unspecified trimester 10/15/2023   Antepartum multigravida of advanced maternal age - 37 at [facility] 10/15/2023   Maternal morbid obesity, antepartum (HCC) Pregrav BMI 46.6 10/15/2023    Maternal Fetal medicine Consult  Melanie [name] is a 38 y.o. G3P1011 at [redacted]w[redacted]d here for ultrasound and consultation. Melanie [name] is doing well today with no acute concerns. Today we focused on the following:   The patient referred for ultrasound to assess the fetal anatomy that was not visualized previously.  The patient's pregnancy is for this reason she will continue growth ultrasound throughout the pregnancy start antenatal increases of antenatal that she be scheduled at that time.  The patient had time to ask questions that were answered to her satisfaction.  She verbalized understanding and agrees to proceed with the plan below.  Sonographic findings Single intrauterine pregnancy at 27w 6d.  Fetal cardiac activity:  Observed and appears normal. Presentation: Cephalic. Interval fetal anatomy appears normal. Fetal biometry shows the estimated fetal weight at the 91 percentile. Amniotic fluid volume: Within normal limits. MVP: 4.41 cm. Placenta: Posterior.  There are limitations of prenatal ultrasound such as the inability to detect certain abnormalities due to poor visualization. Various factors such as fetal position, gestational age and maternal body habitus may increase the difficulty in visualizing the fetal anatomy.    Recommendations -Serial growth ultrasounds every 4-6 weeks until  delivery -Antenatal testing to start around 34 weeks  =BP goal of < 140/90 -Delivery around [redacted] weeks gestation  Review of Systems: A review of systems was performed and was negative except per HPI   Vitals and Physical Exam    02/01/2024    1:34 PM 01/04/2024    3:32 PM 12/21/2023   12:51 PM  Vitals with BMI  Weight  298 lbs 10 oz   Systolic 111 109 883  Diastolic 61 71 58  Pulse 72 74 73    Sitting comfortably on the sonogram table Nonlabored breathing Normal rate and rhythm Abdomen is nontender  Past pregnancies OB History  Gravida Para Term Preterm AB Living  3 1 1  0 1 1  SAB IAB Ectopic Multiple Live Births  0 1 0 0 1    # Outcome Date GA Lbr Len/2nd Weight Sex Type Anes PTL Lv  3 Current           2 Term 06/10/15 [redacted]w[redacted]d  3572 g F Vag-Spont   LIV  1 IAB 2009            Obstetric Comments  Being see @ [facility] due to High Risk pregnancy - weight related. [name], [name]     I spent 30 minutes reviewing the patients chart, including labs and images as well as counseling the patient about her medical conditions. Greater than 50% of the time was spent in direct face-to-face patient counseling.  Delora Smaller  MFM, [place]   02/01/2024  3:15 PM

## 2024-02-07 NOTE — Progress Notes (Signed)
 Pre-charted and appointment cancelled. No charge.

## 2024-02-08 ENCOUNTER — Ambulatory Visit

## 2024-02-08 DIAGNOSIS — O169 Unspecified maternal hypertension, unspecified trimester: Secondary | ICD-10-CM

## 2024-02-08 DIAGNOSIS — O09529 Supervision of elderly multigravida, unspecified trimester: Secondary | ICD-10-CM

## 2024-02-08 DIAGNOSIS — Z3A28 28 weeks gestation of pregnancy: Secondary | ICD-10-CM

## 2024-02-08 DIAGNOSIS — O0990 Supervision of high risk pregnancy, unspecified, unspecified trimester: Secondary | ICD-10-CM

## 2024-02-15 ENCOUNTER — Ambulatory Visit: Admitting: Family Medicine

## 2024-02-15 VITALS — BP 119/74 | HR 94 | Temp 98.3°F | Wt 295.2 lb

## 2024-02-15 DIAGNOSIS — O169 Unspecified maternal hypertension, unspecified trimester: Secondary | ICD-10-CM

## 2024-02-15 DIAGNOSIS — O26843 Uterine size-date discrepancy, third trimester: Secondary | ICD-10-CM

## 2024-02-15 DIAGNOSIS — O0990 Supervision of high risk pregnancy, unspecified, unspecified trimester: Secondary | ICD-10-CM

## 2024-02-15 DIAGNOSIS — R112 Nausea with vomiting, unspecified: Secondary | ICD-10-CM

## 2024-02-15 DIAGNOSIS — Z23 Encounter for immunization: Secondary | ICD-10-CM

## 2024-02-15 DIAGNOSIS — O0993 Supervision of high risk pregnancy, unspecified, third trimester: Secondary | ICD-10-CM

## 2024-02-15 DIAGNOSIS — Z3A30 30 weeks gestation of pregnancy: Secondary | ICD-10-CM

## 2024-02-15 DIAGNOSIS — O99213 Obesity complicating pregnancy, third trimester: Secondary | ICD-10-CM

## 2024-02-15 LAB — HEMOGLOBIN, FINGERSTICK: Hemoglobin: 11.4 g/dL (ref 11.1–15.9)

## 2024-02-15 MED ORDER — DOXYLAMINE-PYRIDOXINE 10-10 MG PO TBEC: DELAYED_RELEASE_TABLET | ORAL | 3 refills | Status: DC

## 2024-02-15 MED ORDER — PROMETHAZINE HCL 25 MG PO TABS: 25.0000 mg | ORAL_TABLET | Freq: Four times a day (QID) | ORAL | 1 refills | Status: DC | PRN

## 2024-02-15 NOTE — Progress Notes (Addendum)
 Here for MH RV at 29 6/7. Needs 28 week labs today. Patient is feeling nauseous. Hulan Parish, RN   Tdap given, tolerated well, VIS given. NCIR mailed to client..Hgb = 11.4 today.Hulan Parish, RN

## 2024-02-15 NOTE — Patient Instructions (Signed)
 Pregnancy Continue taking your prenatal vitamin daily.  Please schedule your next prenatal visit for about 2 weeks from now.  Please visit WIC (women's, infants, and children program) to see about their breast feeding peer support program. You can start this program to get tips and tricks for successful breast feeding of your baby.   Nausea Start diclegis  every day as instructed to prevent nausea and vomiting.  Use promethazine  as needed for nausea episodes.  Eat and drink a lot of ginger to help.   Safe Medications in Pregnancy   Acne: Benzoyl Peroxide Salicylic Acid  Backache/Headache: Tylenol : 2 regular strength every 4 hours OR              2 Extra strength every 6 hours  Colds/Coughs/Allergies: Benadryl (alcohol free) 25 mg every 6 hours as needed Breath right strips Claritin Cepacol throat lozenges Chloraseptic throat spray Cold-Eeze- up to three times per day Cough drops, alcohol free Flonase  (by prescription only) Guaifenesin  Mucinex  Robitussin DM (plain only, alcohol free) Saline nasal spray/drops Sudafed (pseudoephedrine) & Actifed ** use only after [redacted] weeks gestation and if you do not have high blood pressure Tylenol  Vicks Vaporub Zinc lozenges Zyrtec   Constipation: Colace Ducolax suppositories Fleet enema Glycerin suppositories Metamucil Milk of magnesia Miralax Senokot Smooth move tea  Diarrhea: Kaopectate Imodium A-D  *NO pepto Bismol  Hemorrhoids: Anusol Anusol HC Preparation H Tucks  Indigestion: Tums Maalox Mylanta Cimetidine (Tagamet HB)** preferred in pregnancy Famotidine  (Pepcid ) Ranitidine  (Zantac )  Insomnia: Benadryl (alcohol free) 25mg  every 6 hours as needed Tylenol  PM Unisom , no Gelcaps  Leg Cramps: Tums MagGel  Nausea/Vomiting:  Bonine Dramamine Emetrol Ginger extract Sea bands Meclizine   Nausea medication to take during pregnancy:  Unisom  (doxylamine  succinate 25 mg tablets) Take one tablet daily at  bedtime. If symptoms are not adequately controlled, the dose can be increased to a maximum recommended dose of two tablets daily (1/2 tablet in the morning, 1/2 tablet mid-afternoon and one at bedtime). Vitamin B6 100mg  tablets. Take one tablet twice a day (up to 200 mg per day).  Skin Rashes: Aveeno products Benadryl cream or 25mg  every 6 hours as needed Calamine Lotion 1% cortisone cream  Yeast infection: Gyne-lotrimin 7 Monistat 7  **If taking multiple medications, please check labels to avoid duplicating the same active ingredients **take medication as directed on the label ** Do not exceed 4000 mg of tylenol  in 24 hours **Do not take medications that contain aspirin  or ibuprofen   Prenatal Classes If delivering at [facility] with [facility] or [facility]: Go to [email or web address]   If delivering at [facility]: Go to [email or web address] and search for: Pregnancy and Parenting Classes Prepared Childbirth Classes  Resource regarding exposures that could affect pregnancy: Mother To [employer] is a Dentist with lots of information on the effects of many medications and exposures on your pregnancy. It is free to use, including their web site, phone line, text service, app, or email and live chat.  [email or web address]/ Email or live chat: MotherToBaby.org Phone: (249) [phone] Text: ([phone] App: search [employer]   Maternal Mental Health If you start to develop the below symptoms of depression, please reach out to us  for an appointment. There is also a Biomedical scientist Health Hotline at [phone]). This hotline has trained counselors, doulas, and midwifes to real-time support, information, and resources.  Feeling sad or hopeless most of the time Lack of interest in things you used to enjoy Less interest in caring for yourself (dressing, fixing  hair) Trouble concentrating Trouble coping with daily tasks Constant worry about your baby Sleeping or eating too much or too little Feeling very anxious or nervous Unexplained irritability or anger Unwanted or scary thoughts Feeling that you are not a good mother Thoughts of hurting yourself or your baby  If you feel you are experiencing a mental health crisis, please reach out to the National Suicide Prevention Hotline at 1-800-273-TALK (920)697-9134).

## 2024-02-16 ENCOUNTER — Ambulatory Visit: Payer: Self-pay

## 2024-02-16 DIAGNOSIS — O26843 Uterine size-date discrepancy, third trimester: Secondary | ICD-10-CM | POA: Insufficient documentation

## 2024-02-16 HISTORY — DX: Uterine size-date discrepancy, third trimester: O26.843

## 2024-02-16 NOTE — Assessment & Plan Note (Signed)
 BP normal today in clinic. Will continue to monitor.

## 2024-02-16 NOTE — Progress Notes (Signed)
 [facility] [address] [place] [address] Main phone: [phone]  Prenatal Visit  Subjective:  Melanie [name] is a 38 y.o. G3P1011 at [redacted]w[redacted]d being seen today for ongoing prenatal care.  She is currently monitored for the following issues for this high-risk pregnancy:   Patient Active Problem List   Diagnosis Date Noted   Uterine size-date discrepancy in third trimester 02/16/2024   Abnormal Pap smear of cervix 01/04/2024   Elevated blood pressure affecting pregnancy, antepartum 11/09/2023   Supervision of high-risk pregnancy, unspecified trimester 10/15/2023   Antepartum multigravida of advanced maternal age - 50 at [facility] 10/15/2023   Maternal morbid obesity, antepartum (HCC) Pregrav BMI 46.6 10/15/2023   Nausea and vomiting 05/03/2015   Patient reports nausea and vomiting.  Contractions: Not present. Vag. Bleeding: None.  Movement: Present. Denies leaking of fluid/ROM.   The following portions of the patient's history were reviewed and updated as appropriate: allergies, current medications, past family history, past medical history, past social history, past surgical history and problem list. Problem list updated.  Objective:   Vitals:   02/15/24 1047  BP: 119/74  Pulse: 94  Temp: 98.3 F (36.8 C)  Weight: 295 lb 3.2 oz (133.9 kg)   Fetal Status: Fetal Heart Rate (bpm): 142 Fundal Height: 39 cm Movement: Present     General:  Alert, oriented and cooperative. Patient is in no acute distress.  Skin: Skin is warm and dry. No rash noted.   Cardiovascular: Normal heart rate noted  Respiratory: Normal respiratory effort, no problems with respiration noted  Abdomen: Soft, gravid, appropriate for gestational age.  Pain/Pressure: Absent     Pelvic: Cervical exam deferred        Extremities: Normal range of motion.     Mental Status: Normal mood and affect. Normal behavior. Normal judgment and  thought content.   Assessment and Plan:  Pregnancy: G3P1011 at [redacted]w[redacted]d  [redacted] weeks gestation of pregnancy  Supervision of high-risk pregnancy, unspecified trimester Assessment & Plan: Intermittent nausea and vomiting. BP normal. TWG 15 lb 3.2 oz (6.895 kg). Taking prenatal vitamin daily and aspirin  81 mg daily. Next prenatal appointment in 2 weeks.   Discussed or disclosed in AVS: - Breast feeding support through [facility] breast feeding peer counselor program - Mood resources, including 988, Maternal Mental Health Line, and counselor Alan Hail, LCSW, here at [facility] - Prenatal classes - Pre-eclampsia warning signs: Signs and symptoms of preeclampsia were verbally reviewed with warning signs, when and how to call. A written handout with tips on how to take your blood pressure, as well as warning signs was provided to the patient.    Orders: -     HIV-1/HIV-2 Qualitative RNA -     RPR -     Hemoglobin, fingerstick -     Tdap vaccine greater than or equal to 7yo IM  Nausea and vomiting, unspecified vomiting type Assessment & Plan: Intermittent episodes of nausea and vomiting. Has lasted entire pregnancy. Able to PO fluids and maintain normal urination. Clinically stable at this time, physical exam and vitals reassuring. Will resend additional medications to pharmacy - diclegis  for prevention and promethazine  for nausea abatement. Discussed ginger, sea bands, and other non-pharmacologic interventions.   Orders: -     Doxylamine -Pyridoxine ; Initially 2 tablets orally at bedtime on day 1 and 2; if symptoms persist, take 1 tablet in morning and 2 tablets at bedtime on day 3; if symptoms persist, may increase to  MAX 4 tablets per day, administered as 1 tablet in the morning, 1 tablet in mid-afternoon and 2 tablets at bedtime.  Dispense: 60 tablet; Refill: 3 -     Promethazine  HCl; Take 1 tablet (25 mg total) by mouth every 6 (six) hours as needed for nausea or vomiting.  Dispense: 30 tablet; Refill:  1  Maternal morbid obesity, antepartum (HCC) Pregrav BMI 46.6 Assessment & Plan: Per MFM:  -Serial growth ultrasounds every 4-6 weeks until delivery -Antenatal testing to start around 34 weeks  -BP goal of < 140/90 -Delivery around [redacted] weeks gestation  Last growth US  @ [redacted] weeks GA (6/30) reassuring with EFW 90%. Has upcoming MFM scans scheduled for 8/04, 8/20, 8/25, and 9/03.   Uterine size-date discrepancy in third trimester Assessment & Plan: Likely due to habitus and positioning. Patient uncomfortable and nauseous during today's visit. Last growth US  @ [redacted] weeks GA (6/30) reassuring with EFW 90%. Has upcoming MFM scans scheduled for 8/04, 8/20, 8/25, and 9/03.   Elevated blood pressure affecting pregnancy, antepartum Assessment & Plan: BP normal today in clinic. Will continue to monitor.   Preterm labor symptoms and general obstetric precautions including but not limited to vaginal bleeding, contractions, leaking of fluid and fetal movement were reviewed in detail with the patient. Please refer to After Visit Summary for other counseling recommendations.  No follow-ups on file.  Future Appointments  Date Time Provider Department Center  03/01/2024  8:40 AM [facility] PROVIDER [facility] None  03/07/2024 11:00 AM [facility]  03/23/2024  3:00 PM [facility]  03/28/2024  2:00 PM [facility]  04/06/2024  2:00 PM [facility]    Betsey CHRISTELLA Helling, MD

## 2024-02-16 NOTE — Assessment & Plan Note (Signed)
 Intermittent nausea and vomiting. BP normal. TWG 15 lb 3.2 oz (6.895 kg). Taking prenatal vitamin daily and aspirin  81 mg daily. Next prenatal appointment in 2 weeks.   Discussed or disclosed in AVS: - Breast feeding support through [facility] breast feeding peer counselor program - Mood resources, including 988, Maternal Mental Health Line, and counselor Alan Hail, LCSW, here at [facility] - Prenatal classes - Pre-eclampsia warning signs: Signs and symptoms of preeclampsia were verbally reviewed with warning signs, when and how to call. A written handout with tips on how to take your blood pressure, as well as warning signs was provided to the patient.

## 2024-02-16 NOTE — Assessment & Plan Note (Signed)
 Intermittent episodes of nausea and vomiting. Has lasted entire pregnancy. Able to PO fluids and maintain normal urination. Clinically stable at this time, physical exam and vitals reassuring. Will resend additional medications to pharmacy - diclegis  for prevention and promethazine  for nausea abatement. Discussed ginger, sea bands, and other non-pharmacologic interventions.

## 2024-02-16 NOTE — Assessment & Plan Note (Signed)
 Likely due to habitus and positioning. Patient uncomfortable and nauseous during today's visit. Last growth US  @ [redacted] weeks GA (6/30) reassuring with EFW 90%. Has upcoming MFM scans scheduled for 8/04, 8/20, 8/25, and 9/03.

## 2024-02-16 NOTE — Assessment & Plan Note (Addendum)
 Per MFM:  -Serial growth ultrasounds every 4-6 weeks until delivery -Antenatal testing to start around 34 weeks  -BP goal of < 140/90 -Delivery around [redacted] weeks gestation  Last growth US  @ [redacted] weeks GA (6/30) reassuring with EFW 90%. Has upcoming MFM scans scheduled for 8/04, 8/20, 8/25, and 9/03.

## 2024-02-17 LAB — RPR: RPR Ser Ql: NONREACTIVE

## 2024-02-17 LAB — HIV-1/HIV-2 QUALITATIVE RNA
HIV-1 RNA, Qualitative: NONREACTIVE
HIV-2 RNA, Qualitative: NONREACTIVE

## 2024-02-17 NOTE — Progress Notes (Signed)
 Normal HIV and RPR screens.   Dorothyann Helling, MD 02/17/24  1:48 PM

## 2024-03-01 ENCOUNTER — Ambulatory Visit: Admitting: Physician Assistant

## 2024-03-01 ENCOUNTER — Encounter: Payer: Self-pay | Admitting: Physician Assistant

## 2024-03-01 VITALS — BP 105/66 | HR 76 | Temp 97.9°F | Wt 294.4 lb

## 2024-03-01 DIAGNOSIS — O99213 Obesity complicating pregnancy, third trimester: Secondary | ICD-10-CM

## 2024-03-01 DIAGNOSIS — Z3A32 32 weeks gestation of pregnancy: Secondary | ICD-10-CM

## 2024-03-01 DIAGNOSIS — O09523 Supervision of elderly multigravida, third trimester: Secondary | ICD-10-CM

## 2024-03-01 DIAGNOSIS — O0990 Supervision of high risk pregnancy, unspecified, unspecified trimester: Secondary | ICD-10-CM

## 2024-03-01 DIAGNOSIS — O0993 Supervision of high risk pregnancy, unspecified, third trimester: Secondary | ICD-10-CM

## 2024-03-01 DIAGNOSIS — O26843 Uterine size-date discrepancy, third trimester: Secondary | ICD-10-CM

## 2024-03-01 DIAGNOSIS — R112 Nausea with vomiting, unspecified: Secondary | ICD-10-CM

## 2024-03-01 DIAGNOSIS — O169 Unspecified maternal hypertension, unspecified trimester: Secondary | ICD-10-CM

## 2024-03-01 DIAGNOSIS — O09529 Supervision of elderly multigravida, unspecified trimester: Secondary | ICD-10-CM

## 2024-03-01 NOTE — Progress Notes (Signed)
  [facility] [address] [place] [address] Main phone: [phone]  Prenatal Visit  Subjective:  Melanie [name] is a 38 y.o. G3P1011 at [redacted]w[redacted]d being seen today for ongoing prenatal care.  She is currently monitored for the following issues for this high-risk pregnancy:   Patient Active Problem List   Diagnosis Date Noted   Uterine size-date discrepancy in third trimester 02/16/2024   Abnormal Pap smear of cervix 01/04/2024   Elevated blood pressure affecting pregnancy, antepartum 11/09/2023   Supervision of high-risk pregnancy, unspecified trimester 10/15/2023   Antepartum multigravida of advanced maternal age - 52 at [facility] 10/15/2023   Maternal morbid obesity, antepartum (HCC) Pregrav BMI 46.6 10/15/2023   Nausea and vomiting 05/03/2015   Patient reports nausea, vomiting, and ptylism.  Contractions: Not present. Vag. Bleeding: None.  Movement: Present. Denies leaking of fluid/ROM.   The following portions of the patient's history were reviewed and updated as appropriate: allergies, current medications, past family history, past medical history, past social history, past surgical history and problem list. Problem list updated.  Objective:   Vitals:   03/01/24 0911  BP: 105/66  Pulse: 76  Temp: 97.9 F (36.6 C)  Weight: 294 lb 6.4 oz (133.5 kg)    Fetal Status: Fetal Heart Rate (bpm): 160 Fundal Height: 40 cm Movement: Present     General:  Alert, oriented and cooperative. Patient is in no acute distress.  Skin: Skin is warm and dry. No rash noted.   Cardiovascular: Normal heart rate noted  Respiratory: Normal respiratory effort, no problems with respiration noted  Abdomen: Soft, gravid, appropriate for gestational age.  Pain/Pressure: Absent     Pelvic: Cervical exam deferred        Extremities: Normal range of motion.  Edema: None  Mental Status: Normal mood and affect. Normal behavior.  Normal judgment and thought content.   Assessment and Plan:  Pregnancy: G3P1011 at [redacted]w[redacted]d  1. [redacted] weeks gestation of pregnancy RV in 2 weeks.  2. Supervision of high-risk pregnancy, unspecified trimester (Primary) Encouraged to push fluids in light of very hot weather. Daryl for GDM screen today. - Glucose, 1 hour  3. Maternal morbid obesity, antepartum (HCC) Pregrav BMI 46.6 Continue aspirin . TWG 14 lb, appropriate (goal 11-20 lb). Enc to keep antenatal testing appts as scheduled.  4. Antepartum multigravida of advanced maternal age - 52 at [facility] Continue aspirin .  5. Uterine size-date discrepancy in third trimester Size > dates. Enc to keep f/u US  as sched.  6. Elevated blood pressure affecting pregnancy, antepartum BP reassuring at 105/66 today.  7. Nausea and vomiting, unspecified vomiting type Last vomiting yesterday. Has antiemetics, which are helpful. Enc po hydration.   Preterm labor symptoms and general obstetric precautions including but not limited to vaginal bleeding, contractions, leaking of fluid and fetal movement were reviewed in detail with the patient. Please refer to After Visit Summary for other counseling recommendations.  Return in about 2 weeks (around 03/15/2024) for Routine prenatal care.  Future Appointments  Date Time Provider Department Center  03/07/2024 11:00 AM [facility]  03/23/2024  3:00 PM [facility]  03/28/2024  2:00 PM [facility]  04/06/2024  2:00 PM [facility]    Rahkim Rabalais, PA-C

## 2024-03-01 NOTE — Progress Notes (Signed)
 Here for MH RV at 32 weeks. Kick counts reviewed with client and 3 cards given. 1 hour gtt today. Arm measurement 46 cm. Unsure of BCM at this time.Hulan Parish, RN

## 2024-03-02 ENCOUNTER — Telehealth: Payer: Self-pay

## 2024-03-02 ENCOUNTER — Ambulatory Visit: Payer: Self-pay | Admitting: Physician Assistant

## 2024-03-02 ENCOUNTER — Encounter: Payer: Self-pay | Admitting: Physician Assistant

## 2024-03-02 DIAGNOSIS — O99810 Abnormal glucose complicating pregnancy: Secondary | ICD-10-CM | POA: Insufficient documentation

## 2024-03-02 LAB — GLUCOSE, 1 HOUR GESTATIONAL: Gestational Diabetes Screen: 162 mg/dL — ABNORMAL HIGH (ref 70–139)

## 2024-03-02 NOTE — Telephone Encounter (Signed)
 Call to client and counseled needs 3 hour GTT as 1 hour GTT elevated. Test prep instructions provided to client with stated understanding. 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 Client counseled to arrive at 0800 that am instead of 0845 and she agrees to do so. Client correctly verbalizes to remain NPO from 12 midnight and during testing except for small sips ofplain water. Burnadette Lowers, RN

## 2024-03-02 NOTE — Progress Notes (Signed)
 Reviewed 1h GTT - elevated at 162. Please schedule 3h GTT.

## 2024-03-07 ENCOUNTER — Encounter

## 2024-03-07 ENCOUNTER — Ambulatory Visit: Attending: Maternal & Fetal Medicine

## 2024-03-07 VITALS — BP 122/69 | HR 78

## 2024-03-07 DIAGNOSIS — O163 Unspecified maternal hypertension, third trimester: Secondary | ICD-10-CM | POA: Insufficient documentation

## 2024-03-07 DIAGNOSIS — O99213 Obesity complicating pregnancy, third trimester: Secondary | ICD-10-CM | POA: Insufficient documentation

## 2024-03-07 DIAGNOSIS — O09529 Supervision of elderly multigravida, unspecified trimester: Secondary | ICD-10-CM

## 2024-03-07 DIAGNOSIS — O0990 Supervision of high risk pregnancy, unspecified, unspecified trimester: Secondary | ICD-10-CM

## 2024-03-07 DIAGNOSIS — Z3A32 32 weeks gestation of pregnancy: Secondary | ICD-10-CM | POA: Diagnosis not present

## 2024-03-07 DIAGNOSIS — O169 Unspecified maternal hypertension, unspecified trimester: Secondary | ICD-10-CM

## 2024-03-07 DIAGNOSIS — O09523 Supervision of elderly multigravida, third trimester: Secondary | ICD-10-CM | POA: Insufficient documentation

## 2024-03-08 ENCOUNTER — Other Ambulatory Visit: Payer: Self-pay

## 2024-03-08 ENCOUNTER — Observation Stay
Admission: EM | Admit: 2024-03-08 | Discharge: 2024-03-08 | Disposition: A | Discharge status: Home / Self Care | Attending: Obstetrics and Gynecology | Admitting: Obstetrics and Gynecology

## 2024-03-08 ENCOUNTER — Encounter: Payer: Self-pay | Admitting: Obstetrics and Gynecology

## 2024-03-08 DIAGNOSIS — O26893 Other specified pregnancy related conditions, third trimester: Principal | ICD-10-CM

## 2024-03-08 DIAGNOSIS — O09529 Supervision of elderly multigravida, unspecified trimester: Secondary | ICD-10-CM

## 2024-03-08 DIAGNOSIS — M25559 Pain in unspecified hip: Secondary | ICD-10-CM | POA: Diagnosis not present

## 2024-03-08 DIAGNOSIS — O99810 Abnormal glucose complicating pregnancy: Secondary | ICD-10-CM

## 2024-03-08 DIAGNOSIS — O26899 Other specified pregnancy related conditions, unspecified trimester: Secondary | ICD-10-CM | POA: Diagnosis present

## 2024-03-08 DIAGNOSIS — M549 Dorsalgia, unspecified: Secondary | ICD-10-CM

## 2024-03-08 DIAGNOSIS — O368330 Maternal care for abnormalities of the fetal heart rate or rhythm, third trimester, not applicable or unspecified: Secondary | ICD-10-CM | POA: Diagnosis not present

## 2024-03-08 DIAGNOSIS — O99891 Other specified diseases and conditions complicating pregnancy: Secondary | ICD-10-CM | POA: Diagnosis not present

## 2024-03-08 DIAGNOSIS — R519 Headache, unspecified: Secondary | ICD-10-CM | POA: Insufficient documentation

## 2024-03-08 DIAGNOSIS — Z3A33 33 weeks gestation of pregnancy: Secondary | ICD-10-CM | POA: Diagnosis not present

## 2024-03-08 DIAGNOSIS — O0990 Supervision of high risk pregnancy, unspecified, unspecified trimester: Principal | ICD-10-CM

## 2024-03-08 DIAGNOSIS — R102 Pelvic and perineal pain unspecified side: Secondary | ICD-10-CM | POA: Diagnosis present

## 2024-03-08 LAB — CBC
HCT: 34.1 % — ABNORMAL LOW (ref 36.0–46.0)
Hemoglobin: 11.4 g/dL — ABNORMAL LOW (ref 12.0–15.0)
MCH: 27 pg (ref 26.0–34.0)
MCHC: 33.4 g/dL (ref 30.0–36.0)
MCV: 80.8 fL (ref 80.0–100.0)
Platelets: 218 K/uL (ref 150–400)
RBC: 4.22 MIL/uL (ref 3.87–5.11)
RDW: 13.2 % (ref 11.5–15.5)
WBC: 7.4 K/uL (ref 4.0–10.5)
nRBC: 0 % (ref 0.0–0.2)

## 2024-03-08 LAB — WET PREP, GENITAL
Clue Cells Wet Prep HPF POC: NONE SEEN
Sperm: NONE SEEN
Trich, Wet Prep: NONE SEEN
WBC, Wet Prep HPF POC: <10 (ref <10)
Yeast Wet Prep HPF POC: NONE SEEN

## 2024-03-08 LAB — COMPREHENSIVE METABOLIC PANEL WITH GFR
ALT: 25 U/L (ref 0–44)
AST: 32 U/L (ref 15–41)
Albumin: 3.2 g/dL — ABNORMAL LOW (ref 3.5–5.0)
Alkaline Phosphatase: 100 U/L (ref 38–126)
Anion gap: 11 (ref 5–15)
BUN: 7 mg/dL (ref 6–20)
CO2: 20 mmol/L — ABNORMAL LOW (ref 22–32)
Calcium: 8.9 mg/dL (ref 8.9–10.3)
Chloride: 102 mmol/L (ref 98–111)
Creatinine, Ser: 0.85 mg/dL (ref 0.44–1.00)
GFR, Estimated: >60 mL/min (ref >60)
Glucose, Bld: 83 mg/dL (ref 70–99)
Potassium: 3.8 mmol/L (ref 3.5–5.1)
Sodium: 133 mmol/L — ABNORMAL LOW (ref 135–145)
Total Bilirubin: 0.5 mg/dL (ref 0.0–1.2)
Total Protein: 6.9 g/dL (ref 6.5–8.1)

## 2024-03-08 LAB — URINALYSIS, ROUTINE W REFLEX MICROSCOPIC
Bilirubin Urine: NEGATIVE
Glucose, UA: NEGATIVE mg/dL
Hgb urine dipstick: NEGATIVE
Ketones, ur: 20 mg/dL — AB
Leukocytes,Ua: NEGATIVE
Nitrite: NEGATIVE
Protein, ur: NEGATIVE mg/dL
Specific Gravity, Urine: 1.013 (ref 1.005–1.030)
pH: 7 (ref 5.0–8.0)

## 2024-03-08 MED ORDER — SOD CITRATE-CITRIC ACID 500-334 MG/5ML PO SOLN: 30.0000 mL | ORAL | Status: DC | PRN

## 2024-03-08 MED ORDER — ONDANSETRON HCL 4 MG/2ML IJ SOLN: 4.0000 mg | Freq: Four times a day (QID) | INTRAMUSCULAR | Status: DC | PRN

## 2024-03-08 MED ORDER — LACTATED RINGERS IV SOLN: 500.0000 mL | INTRAVENOUS | Status: DC | PRN

## 2024-03-08 MED ORDER — CYCLOBENZAPRINE HCL 5 MG PO TABS: 5.0000 mg | ORAL_TABLET | Freq: Three times a day (TID) | ORAL | 0 refills | Status: AC | PRN

## 2024-03-08 MED ORDER — ACETAMINOPHEN 500 MG PO TABS: 1000.0000 mg | ORAL_TABLET | Freq: Four times a day (QID) | ORAL | 0 refills | Status: AC | PRN

## 2024-03-08 MED ORDER — ACETAMINOPHEN 500 MG PO TABS
1000.0000 mg | ORAL_TABLET | Freq: Four times a day (QID) | ORAL | Status: DC | PRN
  Administered 2024-03-08: 1000 mg via ORAL
  Filled 2024-03-08: qty 2

## 2024-03-08 MED ORDER — CYCLOBENZAPRINE HCL 5 MG PO TABS
5.0000 mg | ORAL_TABLET | Freq: Three times a day (TID) | ORAL | Status: DC | PRN
  Administered 2024-03-08: 5 mg via ORAL
  Filled 2024-03-08: qty 1

## 2024-03-08 NOTE — OB Triage Note (Signed)
 Pt G3P1001 presents for back, butt and hip pain since this morning. Pt reports pain 10/10 that is constant. Pt reports 10/10 HA. Pt denies vision changes, epigastric pain. +FM. Denies LOF/bleeding. Pt reports SOB due to pain.

## 2024-03-08 NOTE — OB Triage Note (Signed)
 Discharge instructions, labor precautions, and follow-up care reviewed with patient. All questions answered. Patient verbalized understanding. Waiting on sister to pick her up to be discharged off unit.

## 2024-03-08 NOTE — Discharge Summary (Signed)
 LABOR & DELIVERY OB TRIAGE NOTE  SUBJECTIVE  HPI Melanie [name] is a 38 y.o. G3P1011 at [redacted]w[redacted]d who presents to Labor & Delivery for back, pelvic, hip pain that began this morning upon waking. She reports a headache as well, denies visual changes or RUQ pain. Denies aggravating or alleviating factors, has not taken tylenol  or other medication. Laying still helps reduce the pain some but does not relieve it entirely. Endorses usual fetal movement, denies contractions, loss of fluid or vaginal bleeding. Reports she feels short of breath due to the pain. Has not eaten today due to pain.  OB History     Gravida  3   Para  1   Term  1   Preterm  0   AB  1   Living  1      SAB  0   IAB  1   Ectopic  0   Multiple  0   Live Births  1        Obstetric Comments  Being see @ [place] due to High Risk pregnancy - weight related. Melanie [name]          Scheduled Meds: Continuous Infusions:  lactated ringers      PRN Meds:.acetaminophen , cyclobenzaprine , lactated ringers , ondansetron , sodium citrate-citric acid   OBJECTIVE  BP (!) 107/56 (BP Location: Left Arm)   Pulse 92   Temp 98.3 F (36.8 C) (Oral)   Resp (!) 24   Ht 5' 4.5 (1.638 m)   Wt 131.1 kg   LMP 07/21/2023 (Exact Date)   SpO2 99%   BMI 48.84 kg/m   General: A&Ox4, mild distress-tearful due to pain Heart: regular rate Lungs: normal work of breathing Abdomen: diffusely tender, No CVA tenderness, pain extends to thighs on palpations, equal bilaterally Cervical exam: Dilation: Closed Effacement (%): Thick Exam by:: Melanie [name] CNM   NST I reviewed the NST and it was reactive.  Baseline: 150 Variability: moderate Accelerations: present Decelerations:none Toco: quiet Category I  Results for orders placed or performed during the hospital encounter of 03/08/24 (from the past 24 hours)  Urinalysis, Routine w reflex microscopic -Urine, Clean Catch     Status: Abnormal   Collection  Time: 03/08/24  5:47 PM  Result Value Ref Range   Color, Urine YELLOW (A) YELLOW   APPearance CLEAR (A) CLEAR   Specific Gravity, Urine 1.013 1.005 - 1.030   pH 7.0 5.0 - 8.0   Glucose, UA NEGATIVE NEGATIVE mg/dL   Hgb urine dipstick NEGATIVE NEGATIVE   Bilirubin Urine NEGATIVE NEGATIVE   Ketones, ur 20 (A) NEGATIVE mg/dL   Protein, ur NEGATIVE NEGATIVE mg/dL   Nitrite NEGATIVE NEGATIVE   Leukocytes,Ua NEGATIVE NEGATIVE  Wet prep, genital     Status: None   Collection Time: 03/08/24  5:47 PM  Result Value Ref Range   Yeast Wet Prep HPF POC NONE SEEN NONE SEEN   Trich, Wet Prep NONE SEEN NONE SEEN   Clue Cells Wet Prep HPF POC NONE SEEN NONE SEEN   WBC, Wet Prep HPF POC <10 <10   Sperm NONE SEEN   CBC     Status: Abnormal   Collection Time: 03/08/24  7:06 PM  Result Value Ref Range   WBC 7.4 4.0 - 10.5 K/uL   RBC 4.22 3.87 - 5.11 MIL/uL   Hemoglobin 11.4 (L) 12.0 - 15.0 g/dL   HCT 65.8 (L) 63.9 - 53.9 %   MCV 80.8 80.0 - 100.0 fL   MCH 27.0 26.0 -  34.0 pg   MCHC 33.4 30.0 - 36.0 g/dL   RDW 86.7 88.4 - 84.4 %   Platelets 218 150 - 400 K/uL   nRBC 0.0 0.0 - 0.2 %  Comprehensive metabolic panel     Status: Abnormal   Collection Time: 03/08/24  7:06 PM  Result Value Ref Range   Sodium 133 (L) 135 - 145 mmol/L   Potassium 3.8 3.5 - 5.1 mmol/L   Chloride 102 98 - 111 mmol/L   CO2 20 (L) 22 - 32 mmol/L   Glucose, Bld 83 70 - 99 mg/dL   BUN 7 6 - 20 mg/dL   Creatinine, Ser 9.14 0.44 - 1.00 mg/dL   Calcium 8.9 8.9 - 89.6 mg/dL   Total Protein 6.9 6.5 - 8.1 g/dL   Albumin 3.2 (L) 3.5 - 5.0 g/dL   AST 32 15 - 41 U/L   ALT 25 0 - 44 U/L   Alkaline Phosphatase 100 38 - 126 U/L   Total Bilirubin 0.5 0.0 - 1.2 mg/dL   GFR, Estimated >39 >39 mL/min   Anion gap 11 5 - 15    US  MFM OB FOLLOW UP Result Date: 03/07/2024 ----------------------------------------------------------------------  OBSTETRICS REPORT                       (Signed Final 03/07/2024 02:08 pm)  ---------------------------------------------------------------------- Patient Info  ID #:       [id number]                          D.O.B.:  1986/05/10 (38 yrs)(F)  Name:       Melanie [name]                Visit Date: 03/07/2024 10:55 am              [place] ---------------------------------------------------------------------- Performed By  Attending:        Nathanel Fetters      Ref. Address:     [address]                    [place]                                                             [address]                                                             [place]                                                             [address]  Performed By:     Elenor Edu BS      [facility]         [facility]                                 [facility] at                                                             [facility]  Referred By:      [name]                    Department ---------------------------------------------------------------------- Orders  #  Description                           Code        Ordered By  1  US  MFM OB FOLLOW UP                   23183.98    DELORA SMALLER ----------------------------------------------------------------------  #  Order #                     Accession #                Episode #  1  [id number]                   [id number]                 [id number] ---------------------------------------------------------------------- Indications  [redacted] weeks gestation of pregnancy                Z3A.92  Advanced maternal age multigravida 61,         O74.523  third trimester  Obesity complicating pregnancy, third          O99.213  trimester (pregravid BMI 46)  Low risk NIPS, neg AFP  Medical complication of pregnancy (hx          O26.90  cystectomy, lt salpingectomy)  Encounter for other antenatal screening        Z36.2  follow-up ---------------------------------------------------------------------- Fetal  Evaluation  Num Of Fetuses:         1  Cardiac Activity:       Observed  Presentation:           Cephalic  Placenta:               Posterior  P. Cord Insertion:      Not well visualized  Amniotic Fluid  AFI FV:      Within normal limits  AFI Sum(cm)     %Tile       Largest Pocket(cm)  18.36           68          6.49  RUQ(cm)       RLQ(cm)       LUQ(cm)        LLQ(cm)  3.97          6.49          5.27           2.63 ---------------------------------------------------------------------- Biometry  BPD:     90.21  mm     G. Age:  36w 4d       > 99  %    CI:        79.42   %    70 - 86                                                          FL/HC:      20.4   %    19.9 - 21.5  HC:    319.96   mm     G. Age:  36w 0d         90  %    HC/AC:      1.03        0.96 - 1.11  AC:    309.97   mm     G. Age:  35w 0d         95  %    FL/BPD:     72.5   %    71 - 87  FL:      65.36  mm     G. Age:  33w 5d         61  %    FL/AC:      21.1   %    20 - 24  Est. FW:    2538  gm    5 lb 10 oz      93  % ---------------------------------------------------------------------- OB History  Gravidity:    3         Term:   1        Prem:   0        SAB:   1  TOP:          0       Ectopic:  0        Living: 1 ---------------------------------------------------------------------- Gestational Age  LMP:           32w 6d        Date:  07/21/23                 EDD:   04/26/24  U/[name] Today:     35w 2d                                        EDD:   04/09/24  Best:          32w 6d     Det. By:  LMP  (07/21/23)          EDD:   04/26/24 ---------------------------------------------------------------------- Targeted Anatomy  Central Nervous System  Calvarium/Cranial V.:  Appears normal         Cereb./Vermis:          Previously seen  Cavum:                 Previously seen        [place] [employer]:         Previously seen  Lateral Ventricles:    Previously seen        Midline Falx:           Previously seen  Choroid Plexus:  Previously seen  Spine  Cervical:               Previously seen        Sacral:                 Previously seen  Thoracic:              Previously seen        Shape/Curvature:        Previously seen  Lumbar:                Previously seen  Head/Neck  Lips:                  Previously seen        Profile:                Previously seen  Neck:                  Previously seen        Orbits/Eyes:            Previously seen  Nuchal Fold:           Not applicable         Mandible:               Previously seen  Nasal Bone:            Present                Maxilla:                Previously seen  Thorax  4 Chamber View:        Previously seen        Interventr. Septum:     Previously seen  Cardiac Rhythm:        Normal                 Cardiac Axis:           Normal  Cardiac Situs:         Appears normal         Diaphragm:              Appears normal  Rt Outflow Tract:      Previously seen        3 Vessel View:          Not well visualized  Lt Outflow Tract:      Previously seen        3 V Trachea View:       Not well visualized  Aortic Arch:           Not well visualized    IVC:                    Not well visualized  Ductal Arch:           Not well visualized    Crossing:               Previously seen  SVC:                   Not well visualized  Abdomen  Ventral Wall:          Previously seen        Lt Kidney:              Appears normal  Cord Insertion:  Previously seen        Rt Kidney:              Appears normal  Situs:                 Appears normal         Bladder:                Appears normal  Stomach:               Appears normal  Extremities  Lt Humerus:            Previously seen        Lt Femur:               Previously seen  Rt Humerus:            Previously seen        Rt Femur:               Previously seen  Lt Forearm:            Previously seen        Lt Lower Leg:           Previously seen  Rt Forearm:            Previously seen        Rt Lower Leg:           Previously seen  Lt Hand:               Previously seen        Lt Foot:                 Previously seen  Rt Hand:               Previously seen        Rt Foot:                Previously seen  Other  Umbilical Cord:        Previously seen  Comment:     Technically difficult due to maternal habitus and fetal position. ---------------------------------------------------------------------- Cervix Uterus Adnexa  Cervix  Not visualized (advanced GA >24wks)  Uterus  No abnormality visualized.  Right Ovary  Not visualized.  Left Ovary  Not visualized.  Cul De Sac  No free fluid seen.  Adnexa  No abnormality visualized ---------------------------------------------------------------------- Impression  Follow up growth due to elevated BMI  Normal interval growth with measurements consistent with  dates  Good fetal movement and amniotic fluid volume  Suboptimal views of the fetal anatomy was obtained  secondary to fetal position and maternal habitus. ---------------------------------------------------------------------- Recommendations  Continue serial growth q 3-4 weeks  Initiate weekly testing at 34 weeks (scheduled until 37 weeks) ----------------------------------------------------------------------              Nathanel Fetters, MD Electronically Signed Final Report   03/07/2024 02:08 pm ----------------------------------------------------------------------    ASSESSMENT Impression  1) Pregnancy at G3P1011, [redacted]w[redacted]d, Estimated Date of Delivery: 04/26/24 2) Reassuring maternal/fetal status 3) Musculoskeletal pain in pregnancy  PLAN Discharge home with preterm labor & fetal movement precautions. Headache resolved with tylenol . Pain improved and able to ambulate independently after single dose of flexeril , 3d prn prescription to pharmacy of choice. No evidence of infection on CBC or urine. Maintain next scheduled appointment Monday 8/11. [name] LITTIE Cisco, CNM 03/08/24  9:01 PM

## 2024-03-14 ENCOUNTER — Ambulatory Visit

## 2024-03-14 ENCOUNTER — Telehealth: Payer: Self-pay

## 2024-03-14 DIAGNOSIS — O26893 Other specified pregnancy related conditions, third trimester: Secondary | ICD-10-CM

## 2024-03-14 DIAGNOSIS — O26843 Uterine size-date discrepancy, third trimester: Secondary | ICD-10-CM

## 2024-03-14 DIAGNOSIS — Z3A33 33 weeks gestation of pregnancy: Secondary | ICD-10-CM

## 2024-03-14 DIAGNOSIS — O0990 Supervision of high risk pregnancy, unspecified, unspecified trimester: Secondary | ICD-10-CM

## 2024-03-14 DIAGNOSIS — O99810 Abnormal glucose complicating pregnancy: Secondary | ICD-10-CM

## 2024-03-14 DIAGNOSIS — O09529 Supervision of elderly multigravida, unspecified trimester: Secondary | ICD-10-CM

## 2024-03-14 DIAGNOSIS — R112 Nausea with vomiting, unspecified: Secondary | ICD-10-CM

## 2024-03-14 NOTE — Telephone Encounter (Signed)
 TC to patient to reschedule missed MH RV. Patient states she had covid last week and took a test this morning and it is still positive. Patient has cough. Taking Tylenol  for fever and pains. Counseled to drink plenty of fluids and that she can refer to her safe medicines in pregnancy sheet for cough medicine if she wants it. Now scheduled for 03/21/24. She works Wednesday through Friday. She will do her 3 hour gtt on the 18th as well.Hulan Parish, RN

## 2024-03-14 NOTE — Telephone Encounter (Signed)
 Patient is rescheduled for 03/21/24, for 3 hour and MH RV.SABRAIzetta Parish, RN

## 2024-03-21 ENCOUNTER — Ambulatory Visit: Admitting: Family Medicine

## 2024-03-21 ENCOUNTER — Other Ambulatory Visit: Payer: Self-pay | Admitting: Family Medicine

## 2024-03-21 VITALS — BP 117/74 | HR 72 | Temp 97.0°F | Wt 294.2 lb

## 2024-03-21 DIAGNOSIS — U071 COVID-19: Secondary | ICD-10-CM

## 2024-03-21 DIAGNOSIS — O09529 Supervision of elderly multigravida, unspecified trimester: Secondary | ICD-10-CM

## 2024-03-21 DIAGNOSIS — Z3A34 34 weeks gestation of pregnancy: Secondary | ICD-10-CM

## 2024-03-21 DIAGNOSIS — O09523 Supervision of elderly multigravida, third trimester: Secondary | ICD-10-CM

## 2024-03-21 DIAGNOSIS — O0993 Supervision of high risk pregnancy, unspecified, third trimester: Secondary | ICD-10-CM

## 2024-03-21 DIAGNOSIS — O98513 Other viral diseases complicating pregnancy, third trimester: Secondary | ICD-10-CM | POA: Insufficient documentation

## 2024-03-21 DIAGNOSIS — O0990 Supervision of high risk pregnancy, unspecified, unspecified trimester: Secondary | ICD-10-CM

## 2024-03-21 DIAGNOSIS — O99810 Abnormal glucose complicating pregnancy: Secondary | ICD-10-CM

## 2024-03-21 DIAGNOSIS — O169 Unspecified maternal hypertension, unspecified trimester: Secondary | ICD-10-CM

## 2024-03-21 DIAGNOSIS — R112 Nausea with vomiting, unspecified: Secondary | ICD-10-CM

## 2024-03-21 DIAGNOSIS — O99213 Obesity complicating pregnancy, third trimester: Secondary | ICD-10-CM

## 2024-03-21 DIAGNOSIS — O26843 Uterine size-date discrepancy, third trimester: Secondary | ICD-10-CM

## 2024-03-21 HISTORY — DX: COVID-19: U07.1

## 2024-03-21 NOTE — Assessment & Plan Note (Signed)
 Continued, daily. Reports ability to keep down water and food in between episodes. Seems to be triggered by certain foods and brushing her teeth.

## 2024-03-21 NOTE — Progress Notes (Signed)
 [facility] [address], [address] [place] [address] Main phone: [phone]  Prenatal Visit  Subjective:  Melanie [name] is a 38 y.o. G3P1011 at 100w6d being seen today for ongoing prenatal care.  She is currently monitored for the following issues for this high-risk pregnancy:   Patient Active Problem List   Diagnosis Date Noted   COVID-19 affecting pregnancy in third trimester 03/21/2024   Pelvic pain affecting pregnancy 03/08/2024   Abnormal glucose tolerance test in pregnancy 03/02/2024   Uterine size-date discrepancy in third trimester 02/16/2024   Abnormal Pap smear of cervix 01/04/2024   Supervision of high-risk pregnancy, unspecified trimester 10/15/2023   Antepartum multigravida of advanced maternal age - 23 at [facility] 10/15/2023   Maternal morbid obesity, antepartum (HCC) Pregrav BMI 46.6 10/15/2023   Nausea and vomiting 05/03/2015   Patient reports backache, fatigue, nausea, and vomiting.  Contractions: Irritability Garlan Irving). Vag. Bleeding: None.  Movement: Present. Denies leaking of fluid/ROM.   The following portions of the patient's history were reviewed and updated as appropriate: allergies, current medications, past family history, past medical history, past social history, past surgical history and problem list. Problem list updated.  Objective:   Vitals:   03/21/24 0830  BP: 117/74  Pulse: 72  Temp: (!) 97 F (36.1 C)  Weight: 294 lb 3.2 oz (133.4 kg)   Fetal Status: Fetal Heart Rate (bpm): 150 Fundal Height: 38 cm Movement: Present     General:  Alert, oriented and cooperative. Patient is in no acute distress.  Skin: Skin is warm and dry. No rash noted.   Cardiovascular: Normal heart rate noted  Respiratory: Normal respiratory effort, no problems with respiration noted  Abdomen: Soft, gravid, appropriate for gestational age.  Pain/Pressure: Absent     Pelvic: Cervical exam  deferred        Extremities: Normal range of motion.     Mental Status: Normal mood and affect. Normal behavior. Normal judgment and thought content.   Assessment and Plan:  Pregnancy: G3P1011 at [redacted]w[redacted]d  [redacted] weeks gestation of pregnancy  Supervision of high-risk pregnancy, unspecified trimester Assessment & Plan: BP normal at 117/74. TWG 14 lb 3.2 oz (6.441 kg). Taking prenatal vitamin daily and aspirin  81 mg daily. Next prenatal appointment in 1 weeks.   Discussed or disclosed in AVS: - Breast feeding support through [facility] breast feeding peer counselor program - Mood resources, including 988, Maternal Mental Health Line, and counselor Alan Hail, LCSW, here at [facility] - Prenatal classes - Pre-eclampsia warning signs: Signs and symptoms of preeclampsia were verbally reviewed with warning signs, when and how to call. A written handout with tips on how to take your blood pressure, as well as warning signs was provided to the patient.   Orders: -     Gestational Glucose Tolerance  Abnormal glucose tolerance test in pregnancy Assessment & Plan: 3 hr GTT today. Will follow results.   Orders: -     Gestational Glucose Tolerance  Maternal morbid obesity, antepartum (HCC) Pregrav BMI 46.6 Assessment & Plan: Per MFM:  -Serial growth ultrasounds every 4-6 weeks until delivery -BP goal of < 140/90 -Delivery around [redacted] weeks gestation   Last growth US  8/04 reassuring with EFW 93%. Has upcoming MFM scans scheduled for 8/20, 8/25, and 9/03. Will need referral to anesthesia for pre-induction work up given BMI > 45.   Antepartum multigravida of advanced maternal age - 31 at [facility] Assessment & Plan: Following with MFM. Next  appointment 8/20.    Elevated blood pressure affecting pregnancy, antepartum Assessment & Plan: BP normal today. No s/s pre-eclampsia. Will continue to monitor.    COVID-19 affecting pregnancy in third trimester Assessment & Plan: URI sxs with positive home COVID test  the week around 8/11, which is why she missed the 8/11 prenatal appointment. Feeling better now, reports residual cough and fatigue.    Uterine size-date discrepancy in third trimester Assessment & Plan: Last MFM scan 8/04 showed EFW 93%, good interval growth and normal fluid. Has upcoming weekly appointments with them, scheduled through [redacted] weeks GA.    Nausea and vomiting, unspecified vomiting type Assessment & Plan: Continued, daily. Reports ability to keep down water and food in between episodes. Seems to be triggered by certain foods and brushing her teeth.    Preterm labor symptoms and general obstetric precautions including but not limited to vaginal bleeding, contractions, leaking of fluid and fetal movement were reviewed in detail with the patient. Please refer to After Visit Summary for other counseling recommendations.  No follow-ups on file.  Future Appointments  Date Time Provider Department Center  03/23/2024  3:00 PM [facility] NST [facility] None  03/28/2024  2:00 PM [facility] US1 [facility]  04/06/2024  2:00 PM [facility] US1 [facility]   Betsey CHRISTELLA Helling, MD

## 2024-03-21 NOTE — Assessment & Plan Note (Signed)
 BP normal today. No s/s pre-eclampsia. Will continue to monitor.

## 2024-03-21 NOTE — Assessment & Plan Note (Addendum)
 BP normal at 117/74. TWG 14 lb 3.2 oz (6.441 kg). Taking prenatal vitamin daily and aspirin  81 mg daily. Next prenatal appointment in 1 weeks.   Discussed or disclosed in AVS: - Breast feeding support through [facility] breast feeding peer counselor program - Mood resources, including 988, Maternal Mental Health Line, and counselor Alan Hail, LCSW, here at [facility] - Prenatal classes - Pre-eclampsia warning signs: Signs and symptoms of preeclampsia were verbally reviewed with warning signs, when and how to call. A written handout with tips on how to take your blood pressure, as well as warning signs was provided to the patient.

## 2024-03-21 NOTE — Patient Instructions (Signed)
 Pregnancy Continue taking your prenatal vitamin daily.  Please schedule your next prenatal visit for just over 1 weeks from now (right around 36 weeks of pregnancy - right now you are 34 weeks 6 days). Please visit WIC (women's, infants, and children program) to see about their breast feeding peer support program. You can start this program to get tips and tricks for successful breast feeding of your baby.   Prenatal Classes If delivering at [facility] with [facility] or [facility]: Go to [email or web address]   If delivering at [facility]: Go to [email or web address] and search for: Pregnancy and Parenting Classes Prepared Childbirth Classes  Resource regarding exposures that could affect pregnancy: [employer] is a Dentist with lots of information on the effects of many medications and exposures on your pregnancy. It is free to use, including their web site, phone line, text service, app, or email and live chat.  http://golden-[email or web address]/ Email or live chat: MotherToBaby.org Phone: [phone] Text: (949)870-0980 App: search [employer]   Maternal Mental Health If you start to develop the below symptoms of depression, please reach out to us  for an appointment. There is also a Biomedical scientist Health Hotline at [phone] [phone]). This hotline has trained counselors, doulas, and midwifes to real-time support, information, and resources.  Feeling sad or hopeless most of the time Lack of interest in things you used to enjoy Less interest in caring for yourself (dressing, fixing hair) Trouble concentrating Trouble coping with daily tasks Constant worry about your baby Sleeping or eating too much or too little Feeling very anxious or nervous Unexplained irritability or anger Unwanted or scary thoughts Feeling that you are not a good mother Thoughts of hurting yourself or  your baby  If you feel you are experiencing a mental health crisis, please reach out to the National Suicide Prevention Hotline at 1-800-273-TALK 305-632-6063).

## 2024-03-21 NOTE — Assessment & Plan Note (Signed)
 Following with MFM. Next appointment 8/20.

## 2024-03-21 NOTE — Assessment & Plan Note (Addendum)
 Per MFM:  -Serial growth ultrasounds every 4-6 weeks until delivery -BP goal of < 140/90 -Delivery around [redacted] weeks gestation   Last growth US  8/04 reassuring with EFW 93%. Has upcoming MFM scans scheduled for 8/20, 8/25, and 9/03. Will need referral to anesthesia for pre-induction work up given BMI > 45.

## 2024-03-21 NOTE — Assessment & Plan Note (Signed)
 3 hr GTT today. Will follow results.

## 2024-03-21 NOTE — Assessment & Plan Note (Signed)
 Last MFM scan 8/04 showed EFW 93%, good interval growth and normal fluid. Has upcoming weekly appointments with them, scheduled through [redacted] weeks GA.

## 2024-03-21 NOTE — Assessment & Plan Note (Signed)
 URI sxs with positive home COVID test the week around 8/11, which is why she missed the 8/11 prenatal appointment. Feeling better now, reports residual cough and fatigue.

## 2024-03-21 NOTE — Progress Notes (Signed)
 Patient states NPO since 12 midnight. She is unsure of birth control method postpartum. Reminded of upcoming appointment 03/23/2024. Consuelo Kingsley RN

## 2024-03-22 ENCOUNTER — Ambulatory Visit: Payer: Self-pay | Admitting: Family Medicine

## 2024-03-22 LAB — GESTATIONAL GLUCOSE TOLERANCE
Glucose, Fasting: 93 mg/dL (ref 70–94)
Glucose, GTT - 1 Hour: 191 mg/dL — ABNORMAL HIGH (ref 70–179)
Glucose, GTT - 2 Hour: 103 mg/dL (ref 70–154)
Glucose, GTT - 3 Hour: 63 mg/dL — ABNORMAL LOW (ref 70–139)

## 2024-03-22 NOTE — Progress Notes (Signed)
 Only one elevated value, meaning equivocal 3 hr GTT. No GDM diagnosis. Already referred to nutrition 10/15/23.   Melanie Helling, MD 03/22/24  4:38 PM

## 2024-03-23 ENCOUNTER — Other Ambulatory Visit

## 2024-03-28 ENCOUNTER — Ambulatory Visit (HOSPITAL_BASED_OUTPATIENT_CLINIC_OR_DEPARTMENT_OTHER): Admitting: Obstetrics and Gynecology

## 2024-03-28 ENCOUNTER — Ambulatory Visit: Attending: Obstetrics and Gynecology

## 2024-03-28 ENCOUNTER — Other Ambulatory Visit: Payer: Self-pay

## 2024-03-28 VITALS — BP 110/66 | HR 88

## 2024-03-28 DIAGNOSIS — Z3A35 35 weeks gestation of pregnancy: Secondary | ICD-10-CM | POA: Insufficient documentation

## 2024-03-28 DIAGNOSIS — O2693 Pregnancy related conditions, unspecified, third trimester: Secondary | ICD-10-CM | POA: Diagnosis not present

## 2024-03-28 DIAGNOSIS — Z906 Acquired absence of other parts of urinary tract: Secondary | ICD-10-CM | POA: Diagnosis not present

## 2024-03-28 DIAGNOSIS — O169 Unspecified maternal hypertension, unspecified trimester: Secondary | ICD-10-CM

## 2024-03-28 DIAGNOSIS — E669 Obesity, unspecified: Secondary | ICD-10-CM

## 2024-03-28 DIAGNOSIS — O0990 Supervision of high risk pregnancy, unspecified, unspecified trimester: Secondary | ICD-10-CM

## 2024-03-28 DIAGNOSIS — O09523 Supervision of elderly multigravida, third trimester: Secondary | ICD-10-CM | POA: Insufficient documentation

## 2024-03-28 DIAGNOSIS — O99213 Obesity complicating pregnancy, third trimester: Secondary | ICD-10-CM | POA: Insufficient documentation

## 2024-03-28 DIAGNOSIS — O09529 Supervision of elderly multigravida, unspecified trimester: Secondary | ICD-10-CM

## 2024-03-28 DIAGNOSIS — O99810 Abnormal glucose complicating pregnancy: Secondary | ICD-10-CM

## 2024-03-28 NOTE — Progress Notes (Signed)
 Maternal-Fetal Medicine   Name: Melanie [name] MRN: [id number] G3 P1011 at 35w 6d gestation.  Patient history of antenatal testing. Pregravid BMI 46. Patient does not have gestational diabetes (3-hour GTT).  Ultrasound Amniotic fluid is normal good.  Likely same.  Cephalic presentation.  Antenatal testing is reassuring.  BPP 8/8.  Obstetric history significant for early term vaginal delivery at [redacted] weeks gestation.  Pregravid BMI 46 Grade 3 obesity is independently associated with increased risk of stillbirth (2.5- to 3-fold), but the absolute risk is very small.  I discussed protocol of weekly antenatal testing from [redacted] weeks gestation until delivery. I counseled the patient that antenatal testing does not always predict fetal compromise.  Recommendations -Continue weekly antenatal testing till delivery. - Consider delivery at [redacted] weeks gestation if cervix is favorable  Consultation including face-to-face counseling (more than 50% of time spent) is 10 minutes.

## 2024-04-03 ENCOUNTER — Observation Stay
Admission: EM | Admit: 2024-04-03 | Discharge: 2024-04-03 | Disposition: A | Discharge status: Home / Self Care | Attending: Obstetrics | Admitting: Obstetrics

## 2024-04-03 ENCOUNTER — Encounter: Payer: Self-pay | Admitting: Obstetrics

## 2024-04-03 DIAGNOSIS — R102 Pelvic and perineal pain: Secondary | ICD-10-CM | POA: Insufficient documentation

## 2024-04-03 DIAGNOSIS — Z3A36 36 weeks gestation of pregnancy: Principal | ICD-10-CM

## 2024-04-03 DIAGNOSIS — O26893 Other specified pregnancy related conditions, third trimester: Secondary | ICD-10-CM | POA: Diagnosis present

## 2024-04-03 DIAGNOSIS — R109 Unspecified abdominal pain: Secondary | ICD-10-CM

## 2024-04-03 LAB — COMPREHENSIVE METABOLIC PANEL WITH GFR
ALT: 18 U/L (ref 0–44)
AST: 22 U/L (ref 15–41)
Albumin: 2.7 g/dL — ABNORMAL LOW (ref 3.5–5.0)
Alkaline Phosphatase: 120 U/L (ref 38–126)
Anion gap: 8 (ref 5–15)
BUN: 10 mg/dL (ref 6–20)
CO2: 21 mmol/L — ABNORMAL LOW (ref 22–32)
Calcium: 8.3 mg/dL — ABNORMAL LOW (ref 8.9–10.3)
Chloride: 104 mmol/L (ref 98–111)
Creatinine, Ser: 0.77 mg/dL (ref 0.44–1.00)
GFR, Estimated: >60 mL/min (ref >60)
Glucose, Bld: 124 mg/dL — ABNORMAL HIGH (ref 70–99)
Potassium: 3.9 mmol/L (ref 3.5–5.1)
Sodium: 133 mmol/L — ABNORMAL LOW (ref 135–145)
Total Bilirubin: 0.4 mg/dL (ref 0.0–1.2)
Total Protein: 5.8 g/dL — ABNORMAL LOW (ref 6.5–8.1)

## 2024-04-03 LAB — CBC WITH DIFFERENTIAL/PLATELET
Abs Immature Granulocytes: 0.04 K/uL (ref 0.00–0.07)
Basophils Absolute: 0 K/uL (ref 0.0–0.1)
Basophils Relative: 0 %
Eosinophils Absolute: 0.1 K/uL (ref 0.0–0.5)
Eosinophils Relative: 1 %
HCT: 33.2 % — ABNORMAL LOW (ref 36.0–46.0)
Hemoglobin: 10.8 g/dL — ABNORMAL LOW (ref 12.0–15.0)
Immature Granulocytes: 0 %
Lymphocytes Relative: 22 %
Lymphs Abs: 2 K/uL (ref 0.7–4.0)
MCH: 26.7 pg (ref 26.0–34.0)
MCHC: 32.5 g/dL (ref 30.0–36.0)
MCV: 82 fL (ref 80.0–100.0)
Monocytes Absolute: 0.7 K/uL (ref 0.1–1.0)
Monocytes Relative: 8 %
Neutro Abs: 6.3 K/uL (ref 1.7–7.7)
Neutrophils Relative %: 69 %
Platelets: 236 K/uL (ref 150–400)
RBC: 4.05 MIL/uL (ref 3.87–5.11)
RDW: 13.6 % (ref 11.5–15.5)
WBC: 9.1 K/uL (ref 4.0–10.5)
nRBC: 0 % (ref 0.0–0.2)

## 2024-04-03 LAB — WET PREP, GENITAL
Clue Cells Wet Prep HPF POC: NONE SEEN
Sperm: NONE SEEN
Trich, Wet Prep: NONE SEEN
WBC, Wet Prep HPF POC: >=10 — AB (ref <10)
Yeast Wet Prep HPF POC: NONE SEEN

## 2024-04-03 LAB — URINALYSIS, ROUTINE W REFLEX MICROSCOPIC
Bilirubin Urine: NEGATIVE
Glucose, UA: NEGATIVE mg/dL
Hgb urine dipstick: NEGATIVE
Ketones, ur: NEGATIVE mg/dL
Leukocytes,Ua: NEGATIVE
Nitrite: NEGATIVE
Protein, ur: NEGATIVE mg/dL
Specific Gravity, Urine: 1.016 (ref 1.005–1.030)
pH: 7 (ref 5.0–8.0)

## 2024-04-03 MED ORDER — ACETAMINOPHEN 500 MG PO TABS
1000.0000 mg | ORAL_TABLET | Freq: Once | ORAL | Status: AC
  Administered 2024-04-03: 1000 mg via ORAL
  Filled 2024-04-03: qty 2

## 2024-04-03 NOTE — Final Progress Note (Signed)
 OB/Triage Note  Patient ID: Melanie [name] MRN: [id number] DOB/AGE: [birth year] 38 y.o.  Subjective  History of Present Illness: The patient is a 38 y.o. female G3P1011 at [redacted]w[redacted]d who presents for abdominal and vaginal pain. Reports abdominal pain for past three days, pain is sharp, constant, nothing makes it worse but also nothing improves it. Has not tried anything for improvement. Had some nausea this morning but not now. Also had two soft Bms earlier in the day. Abdominal pain is RUQ and epigastric. Had ovid two weeks ago. Reports sharp vaiginal pains that started this morning and have gotten progressively worse, happens about every 10-82min. Denies LOF, VB. Endorses good FM.   Past Medical History:  Diagnosis Date   Absolute anemia 02/22/2015   Anemia    during pregnancy   Elevated blood pressure affecting pregnancy, antepartum 11/09/2023   Gall bladder disease 2016   Obesity affecting pregnancy    Dermoid Cyst   Pneumonia    2023 and 2025    Past Surgical History:  Procedure Laterality Date   CHOLECYSTECTOMY  2017   OVARIAN CYST REMOVAL  2012   SALPINGECTOMY Left 2024    No current facility-administered medications on file prior to encounter.   Current Outpatient Medications on File Prior to Encounter  Medication Sig Dispense Refill   acetaminophen  (TYLENOL ) 500 MG tablet Take 2 tablets (1,000 mg total) by mouth every 6 (six) hours as needed for mild pain (pain score 1-3). 30 tablet 0   aspirin  EC 81 MG tablet Take 1 tablet (81 mg total) by mouth daily. Take after 12 weeks for prevention of preeclampsia later in pregnancy     Prenatal Vit-Fe Fumarate-FA (PRENATAL PO) Take by mouth.     albuterol  (VENTOLIN  HFA) 108 (90 Base) MCG/ACT inhaler Inhale 2 puffs into the lungs every 4 (four) hours as needed. (Patient not taking: Reported on 02/15/2024) 18 g 0   Doxylamine -Pyridoxine  10-10 MG TBEC Initially 2 tablets orally at bedtime on day 1 and 2; if symptoms  persist, take 1 tablet in morning and 2 tablets at bedtime on day 3; if symptoms persist, may increase to MAX 4 tablets per day, administered as 1 tablet in the morning, 1 tablet in mid-afternoon and 2 tablets at bedtime. (Patient not taking: Reported on 04/03/2024) 60 tablet 3   ondansetron  (ZOFRAN ) 4 MG tablet Take 1 tablet (4 mg total) by mouth every 6 (six) hours as needed for nausea or vomiting. (Patient not taking: No sig reported) 15 tablet 0   promethazine  (PHENERGAN ) 25 MG tablet Take 1 tablet (25 mg total) by mouth every 6 (six) hours as needed for nausea or vomiting. (Patient not taking: Reported on 04/03/2024) 30 tablet 1   Spacer/Aero-Holding Chambers (AEROCHAMBER MV) inhaler Use as instructed (Patient not taking: Reported on 02/15/2024) 1 each 2   [DISCONTINUED] fluticasone  (FLONASE ) 50 MCG/ACT nasal spray Place 2 sprays into both nostrils daily. 16 g 0    No Known Allergies  Social History   Socioeconomic History   Marital status: Single    Spouse name: Declined as not in relationship with FOB   Number of children: 1   Years of education: 12   Highest education level: High school graduate  Occupational History   Not on file  Tobacco Use   Smoking status: Former    Current packs/day: 0.00    Types: Cigarettes    Quit date: 08/05/2023    Years since quitting: 0.6    Passive exposure: Past  Smokeless tobacco: Never   Tobacco comments:    Stopped cigarette use 1//2025 when found out pregnant.     Last vaped in summer of 2024.  Vaping Use   Vaping status: Former   Substances: Nicotine, Flavoring  Substance and Sexual Activity   Alcohol use: Not Currently    Comment: Last use 07/29/2023   Drug use: Not Currently    Types: Marijuana    Comment: Last marijuana use was 08/2023.   Sexual activity: Yes    Partners: Male    Birth control/protection: None    Comment: Last took ocps at age 61 years.  Other Topics Concern   Not on file  Social History Narrative   Not on file    Social Drivers of Health   Financial Resource Strain: Medium Risk (10/15/2023)   Overall Financial Resource Strain (CARDIA)    Difficulty of Paying Living Expenses: Somewhat hard  Food Insecurity: Food Insecurity Present (10/15/2023)   Hunger Vital Sign    Worried About Running Out of Food in the Last Year: Sometimes true    Ran Out of Food in the Last Year: Sometimes true  Transportation Needs: No Transportation Needs (10/15/2023)   PRAPARE - Administrator, Civil Service (Medical): No    Lack of Transportation (Non-Medical): No  Physical Activity: Not on file  Stress: Not on file  Social Connections: Not on file  Intimate Partner Violence: Not At Risk (10/15/2023)   Humiliation, Afraid, Rape, and Kick questionnaire    Fear of Current or Ex-Partner: No    Emotionally Abused: No    Physically Abused: No    Sexually Abused: No    Family History  Problem Relation Age of Onset   Healthy Mother    Hypertension Father    Diabetes Father    Heart disease Father    Healthy Sister    Healthy Brother    Healthy Daughter    Ovarian cancer Maternal Grandmother    Prostate cancer Paternal Grandfather    Healthy Half-Brother      ROS    Objective  Physical Exam: BP (!) 106/49 (BP Location: Left Arm)   Pulse 70   Temp 97.8 F (36.6 C) (Oral)   Resp 20   LMP 07/21/2023 (Exact Date)   OBGyn Exam  SVE closed/thick Abd: soft, tender on palpation to epigastric and RUQ  FHT 145, mod variability, pos accels, no decels Toco no contractions detected  Significant Findings/ Diagnostic Studies: UA, CMP, CBC and wet prep all WNL   Hospital Course: The patient was admitted to [facility] Triage for observation. Had reactive NST. Took 1g tylenol , which did not help the pain. Offered morphine  which patient declined. UA was negative. Spoke with Dr. Leigh who recommended doing CMP, CBC and wet prep. All were also negative. Reassurance given to patient. Unsure of etiology of pain but was  able to rule out troublesome causes. Patient was comfortable with going home. Has f/u this week with both [facility] and MFM.   Assessment: 38 y.o. female G3P1011 at [redacted]w[redacted]d  RNST Vaginal and abdominal pain- continued  Plan: Discharge home Follow up with OB provider Teaching: PTL s/sx  Discharge Instructions     Discharge activity:  No Restrictions   Complete by: As directed    Discharge diet:  No restrictions   Complete by: As directed    Discharge instructions   Complete by: As directed    Follow up with your OB provider at your next  ROB   LABOR:  When conractions begin, you should start to time them from the beginning of one contraction to the beginning  of the next.  When contractions are 5 - 10 minutes apart or less and have been regular for at least an hour, you should call your health care provider.   Complete by: As directed    No sexual activity restrictions   Complete by: As directed    Notify physician for a general feeling that something is not right   Complete by: As directed    Notify physician for bleeding from the vagina   Complete by: As directed    Notify physician for blurring of vision or spots before the eyes   Complete by: As directed    Notify physician for chills or fever   Complete by: As directed    Notify physician for fainting spells, black outs or loss of consciousness   Complete by: As directed    Notify physician for increase in vaginal discharge   Complete by: As directed    Notify physician for increase or change in vaginal discharge   Complete by: As directed    Notify physician for intestinal cramps, with or without diarrhea, sometimes described as gas pain   Complete by: As directed    Notify physician for leaking of fluid   Complete by: As directed    Notify physician for leaking of fluid   Complete by: As directed    Notify physician for low, dull backache, unrelieved by heat or Tylenol    Complete by: As directed     Notify physician for menstrual like cramps   Complete by: As directed    Notify physician for pain or burning when urinating   Complete by: As directed    Notify physician for pelvic pressure   Complete by: As directed    Notify physician for pelvic pressure (sudden increase)   Complete by: As directed    Notify physician for severe or continued nausea or vomiting   Complete by: As directed    Notify physician for sudden gushing of fluid from the vagina (with or without continued leaking)   Complete by: As directed    Notify physician for sudden, constant, or occasional abdominal pain   Complete by: As directed    Notify physician for uterine contractions.  These may be painless and feel like the uterus is tightening or the baby is  balling up   Complete by: As directed    Notify physician for vaginal bleeding   Complete by: As directed    Notify physician if baby moving less than usual   Complete by: As directed    PRETERM LABOR:  Includes any of the follwing symptoms that occur between 20 - [redacted] weeks gestation.  If these symptoms are not stopped, preterm labor can result in preterm delivery, placing your baby at risk   Complete by: As directed       Allergies as of 04/03/2024   No Known Allergies      Medication List     TAKE these medications    acetaminophen  500 MG tablet Commonly known as: TYLENOL  Take 2 tablets (1,000 mg total) by mouth every 6 (six) hours as needed for mild pain (pain score 1-3).   AeroChamber MV inhaler Use as instructed   albuterol  108 (90 Base) MCG/ACT inhaler Commonly known as: VENTOLIN  HFA Inhale 2 puffs into the lungs every 4 (four) hours as needed.   aspirin  EC  81 MG tablet Take 1 tablet (81 mg total) by mouth daily. Take after 12 weeks for prevention of preeclampsia later in pregnancy   Doxylamine -Pyridoxine  10-10 MG Tbec Initially 2 tablets orally at bedtime on day 1 and 2; if symptoms persist, take 1 tablet in morning and 2  tablets at bedtime on day 3; if symptoms persist, may increase to MAX 4 tablets per day, administered as 1 tablet in the morning, 1 tablet in mid-afternoon and 2 tablets at bedtime.   ondansetron  4 MG tablet Commonly known as: ZOFRAN  Take 1 tablet (4 mg total) by mouth every 6 (six) hours as needed for nausea or vomiting.   PRENATAL PO Take by mouth.   promethazine  25 MG tablet Commonly known as: PHENERGAN  Take 1 tablet (25 mg total) by mouth every 6 (six) hours as needed for nausea or vomiting.         Total time spent taking care of this patient: 2 hours  Signed: Lolita Loots CNM, FNP 04/03/2024, 11:18 PM

## 2024-04-03 NOTE — OB Triage Note (Signed)
 Melanie [name] 38 y.o. @G3P1 @36w5dGA   presents to Labor & Delivery triage via wheelchair steered by ED staff reporting pain in center of abdomen for the past 3 days and today started having pain in vagina. She states tightness in abdomen with tenderness on right upper abdominal area. . She denies signs and symptoms consistent with rupture of membranes or active vaginal bleeding. She states positive fetal movement. External FM and TOCO applied to non-tender abdomen. Initial FHR 168. Vital signs obtained and within normal limits. Patient oriented to care environment including call bell and bed control use. Lolita Loots, CNM notified of patient's arrival.

## 2024-04-03 NOTE — Progress Notes (Signed)
 Discharge instructions provided to patient. Patient verbalized understanding. Pt educated on signs and symptoms of labor, vaginal bleeding, LOF, and fetal movement. Red flag signs reviewed by RN. Patient discharged home with sister in stable condition.

## 2024-04-05 ENCOUNTER — Ambulatory Visit: Admitting: Family Medicine

## 2024-04-05 ENCOUNTER — Ambulatory Visit

## 2024-04-05 VITALS — BP 125/81 | HR 70 | Temp 97.3°F | Wt 299.0 lb

## 2024-04-05 DIAGNOSIS — Z3A37 37 weeks gestation of pregnancy: Secondary | ICD-10-CM

## 2024-04-05 DIAGNOSIS — O0993 Supervision of high risk pregnancy, unspecified, third trimester: Secondary | ICD-10-CM

## 2024-04-05 DIAGNOSIS — O26843 Uterine size-date discrepancy, third trimester: Secondary | ICD-10-CM

## 2024-04-05 DIAGNOSIS — O09529 Supervision of elderly multigravida, unspecified trimester: Secondary | ICD-10-CM

## 2024-04-05 DIAGNOSIS — O09523 Supervision of elderly multigravida, third trimester: Secondary | ICD-10-CM

## 2024-04-05 DIAGNOSIS — O0990 Supervision of high risk pregnancy, unspecified, unspecified trimester: Secondary | ICD-10-CM

## 2024-04-05 DIAGNOSIS — O99213 Obesity complicating pregnancy, third trimester: Secondary | ICD-10-CM

## 2024-04-05 NOTE — Progress Notes (Signed)
 Here for MH RV at 37 weeks. 36 week labs today, packet given. Aware of 04/06/24 U/S. Self-collecting labs.Hulan Parish, RN

## 2024-04-05 NOTE — Patient Instructions (Signed)
 Pregnancy Continue taking your prenatal vitamin daily and aspirin  81 mg daily.  Please schedule your next prenatal visit for about 1 weeks from now.  Please visit WIC (women's, infants, and children program) to see about their breast feeding peer support program. You can start this program to get tips and tricks for successful breast feeding of your baby.   I will send a request to have your induction right about 39 weeks of pregnancy.   Prenatal Classes If delivering at [facility] with [facility] or [facility]: Go to [email or web address]   If delivering at [facility]: Go to [email or web address] and search for: Pregnancy and Parenting Classes Prepared Childbirth Classes  Resource regarding exposures that could affect pregnancy: [employer] is a Dentist with lots of information on the effects of many medications and exposures on your pregnancy. It is free to use, including their web site, phone line, text service, app, or email and live chat.  http://golden-thomas.org/ Email or live chat: MotherToBaby.org Phone: [phone] Text: (304) 431-2783 App: search [employer]   Maternal Mental Health If you start to develop the below symptoms of depression, please reach out to us  for an appointment. There is also a Biomedical scientist Health Hotline at [phone] 306-151-3702). This hotline has trained counselors, doulas, and midwifes to real-time support, information, and resources.  Feeling sad or hopeless most of the time Lack of interest in things you used to enjoy Less interest in caring for yourself (dressing, fixing hair) Trouble concentrating Trouble coping with daily tasks Constant worry about your baby Sleeping or eating too much or too little Feeling very anxious or nervous Unexplained irritability or anger Unwanted or scary thoughts Feeling that you are not a good  mother Thoughts of hurting yourself or your baby  If you feel you are experiencing a mental health crisis, please reach out to the National Suicide Prevention Hotline at 1-800-273-TALK 650-100-0497).

## 2024-04-06 ENCOUNTER — Ambulatory Visit (HOSPITAL_BASED_OUTPATIENT_CLINIC_OR_DEPARTMENT_OTHER): Admitting: Obstetrics

## 2024-04-06 ENCOUNTER — Ambulatory Visit: Attending: Obstetrics

## 2024-04-06 VITALS — BP 130/62 | HR 84

## 2024-04-06 DIAGNOSIS — O99810 Abnormal glucose complicating pregnancy: Secondary | ICD-10-CM

## 2024-04-06 DIAGNOSIS — O169 Unspecified maternal hypertension, unspecified trimester: Secondary | ICD-10-CM

## 2024-04-06 DIAGNOSIS — O99213 Obesity complicating pregnancy, third trimester: Secondary | ICD-10-CM | POA: Insufficient documentation

## 2024-04-06 DIAGNOSIS — O09529 Supervision of elderly multigravida, unspecified trimester: Secondary | ICD-10-CM

## 2024-04-06 DIAGNOSIS — O09523 Supervision of elderly multigravida, third trimester: Secondary | ICD-10-CM

## 2024-04-06 DIAGNOSIS — O0990 Supervision of high risk pregnancy, unspecified, unspecified trimester: Secondary | ICD-10-CM

## 2024-04-06 DIAGNOSIS — E669 Obesity, unspecified: Secondary | ICD-10-CM

## 2024-04-06 DIAGNOSIS — Z9079 Acquired absence of other genital organ(s): Secondary | ICD-10-CM | POA: Diagnosis not present

## 2024-04-06 DIAGNOSIS — O2693 Pregnancy related conditions, unspecified, third trimester: Secondary | ICD-10-CM | POA: Diagnosis not present

## 2024-04-06 DIAGNOSIS — Z3A37 37 weeks gestation of pregnancy: Secondary | ICD-10-CM | POA: Insufficient documentation

## 2024-04-06 DIAGNOSIS — O99210 Obesity complicating pregnancy, unspecified trimester: Secondary | ICD-10-CM

## 2024-04-06 NOTE — Progress Notes (Signed)
 MFM Consult Note  Melanie [name] is currently at 37 weeks and 1 day.  She has been followed due to advanced maternal age (38 years old) and maternal obesity with a BMI of 48.5.   She denies any problems since her last exam.  Sonographic findings Single intrauterine pregnancy at 37w 1d.  Fetal cardiac activity:  Observed and appears normal. Presentation: Cephalic. Interval fetal anatomy appears normal. Fetal biometry shows the estimated fetal weight of 7 pounds 10 ounces measures at the 86 percentile. Amniotic fluid volume: Within normal limits. MVP: 7.89 cm. Placenta: Posterior.  There are limitations of prenatal ultrasound such as the inability to detect certain abnormalities due to poor visualization. Various factors such as fetal position, gestational age and maternal body habitus may increase the difficulty in visualizing the fetal anatomy.    Due to morbid maternal obesity and the increased risk of IUFD, delivery is recommended at around 39 weeks.    The patient will discuss scheduling a an induction with you at her next prenatal visit.    She will return next week for an NST.    The patient stated that all of her questions were answered today.  A total of 20 minutes was spent counseling and coordinating the care for this patient.  Greater than 50% of the time was spent in direct face-to-face contact.

## 2024-04-07 ENCOUNTER — Ambulatory Visit: Payer: Self-pay | Admitting: Family Medicine

## 2024-04-07 DIAGNOSIS — B951 Streptococcus, group B, as the cause of diseases classified elsewhere: Secondary | ICD-10-CM

## 2024-04-07 DIAGNOSIS — O0990 Supervision of high risk pregnancy, unspecified, unspecified trimester: Secondary | ICD-10-CM

## 2024-04-07 LAB — CHLAMYDIA/GC NAA, CONFIRMATION
Chlamydia trachomatis, NAA: NEGATIVE
Neisseria gonorrhoeae, NAA: NEGATIVE

## 2024-04-07 NOTE — Progress Notes (Signed)
 Negative gonorrhea and chlamydia.   Dorothyann Helling, MD 04/07/24  7:37 PM

## 2024-04-08 LAB — CULTURE, BETA STREP (GROUP B ONLY): Strep Gp B Culture: POSITIVE — AB

## 2024-04-10 DIAGNOSIS — B951 Streptococcus, group B, as the cause of diseases classified elsewhere: Secondary | ICD-10-CM | POA: Insufficient documentation

## 2024-04-10 NOTE — Progress Notes (Signed)
 Reviewed 36 week labs. POSITIVE GBS. Negative gonorrhea and chlamydia swabs.   Dorothyann Helling, MD 04/10/24  2:29 PM

## 2024-04-11 ENCOUNTER — Telehealth: Payer: Self-pay

## 2024-04-11 ENCOUNTER — Ambulatory Visit: Attending: Obstetrics and Gynecology

## 2024-04-11 ENCOUNTER — Ambulatory Visit (HOSPITAL_BASED_OUTPATIENT_CLINIC_OR_DEPARTMENT_OTHER): Admitting: Obstetrics and Gynecology

## 2024-04-11 ENCOUNTER — Ambulatory Visit

## 2024-04-11 DIAGNOSIS — E669 Obesity, unspecified: Secondary | ICD-10-CM | POA: Diagnosis not present

## 2024-04-11 DIAGNOSIS — O99213 Obesity complicating pregnancy, third trimester: Secondary | ICD-10-CM

## 2024-04-11 DIAGNOSIS — Z3A37 37 weeks gestation of pregnancy: Secondary | ICD-10-CM | POA: Diagnosis not present

## 2024-04-11 NOTE — Telephone Encounter (Signed)
Call to client

## 2024-04-11 NOTE — Procedures (Signed)
 Melanie [name] 1986-01-31 [redacted]w[redacted]d  Fetus A Non-Stress Test Interpretation for 04/11/24  Indication: Maternal obesity - NST only  Fetal Heart Rate A Mode: External Baseline Rate (A): 140 bpm Variability: Moderate Accelerations: 15 x 15 Decelerations: None Multiple birth?: No  Uterine Activity Mode: Toco, Palpation Contraction Frequency (min): one ctx noted in this segment Contraction Duration (sec): 70 Resting Tone Palpated: Relaxed  Interpretation (Fetal Testing) Nonstress Test Interpretation: Reactive Comments: Reviewed with Dr. Arna

## 2024-04-11 NOTE — Telephone Encounter (Signed)
 Call to client to reschedule 04/11/24 pm MHC RV due to no available provider (illness). Left message to call regarding above and number to call provided. Burnadette Lowers, RN

## 2024-04-11 NOTE — Telephone Encounter (Signed)
 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 04/18/24 MHC RV appt scheduled. Burnadette Lowers, RN

## 2024-04-11 NOTE — Progress Notes (Signed)
 Maternal-Fetal Medicine Consultation  Name: Melanie [name]  MRN: [id number]  GA: [id number] [redacted]w[redacted]d   Patient is here for NST. Pregravid BMI 46. Patient does not have gestational diabetes. Blood pressure today at our office is 130/62 mmHg  NST reactive. I reassured the patient of the findings.  We discussed timing of delivery.  Given that she has no other high-risk factors other than obesity, she can be delivered at 39 to [redacted] weeks gestation.  Induction of labor at 39 weeks is reasonable if cervix is favorable.  We discussed future pregnancies. weight loss and contraception.  Patient is not planning to have any more children.  She may consider IUD placement. I also discussed increased risk for fetal aneuploidy's because of advanced maternal age.  Recommendations -Induction of labor at 39-weeks' gestation if cervix is favorable. -NST at your office next week.     Consultation including face-to-face (more than 50%) counseling 20 minutes.

## 2024-04-14 ENCOUNTER — Encounter: Payer: Self-pay | Admitting: Obstetrics and Gynecology

## 2024-04-14 ENCOUNTER — Observation Stay
Admission: EM | Admit: 2024-04-14 | Discharge: 2024-04-14 | Disposition: A | Discharge status: Home / Self Care | Attending: Certified Nurse Midwife | Admitting: Certified Nurse Midwife

## 2024-04-14 ENCOUNTER — Other Ambulatory Visit: Payer: Self-pay

## 2024-04-14 DIAGNOSIS — O09529 Supervision of elderly multigravida, unspecified trimester: Secondary | ICD-10-CM

## 2024-04-14 DIAGNOSIS — M549 Dorsalgia, unspecified: Secondary | ICD-10-CM | POA: Diagnosis not present

## 2024-04-14 DIAGNOSIS — O99810 Abnormal glucose complicating pregnancy: Secondary | ICD-10-CM

## 2024-04-14 DIAGNOSIS — O26893 Other specified pregnancy related conditions, third trimester: Principal | ICD-10-CM

## 2024-04-14 DIAGNOSIS — Z3A38 38 weeks gestation of pregnancy: Secondary | ICD-10-CM

## 2024-04-14 DIAGNOSIS — O368330 Maternal care for abnormalities of the fetal heart rate or rhythm, third trimester, not applicable or unspecified: Secondary | ICD-10-CM | POA: Insufficient documentation

## 2024-04-14 DIAGNOSIS — O0990 Supervision of high risk pregnancy, unspecified, unspecified trimester: Principal | ICD-10-CM

## 2024-04-14 DIAGNOSIS — B951 Streptococcus, group B, as the cause of diseases classified elsewhere: Secondary | ICD-10-CM

## 2024-04-14 HISTORY — DX: Dorsalgia, unspecified: M54.9

## 2024-04-14 MED ORDER — LACTATED RINGERS IV SOLN: 500.0000 mL | INTRAVENOUS | Status: DC | PRN

## 2024-04-14 MED ORDER — ONDANSETRON HCL 4 MG/2ML IJ SOLN: 4.0000 mg | Freq: Four times a day (QID) | INTRAMUSCULAR | Status: DC | PRN

## 2024-04-14 MED ORDER — SOD CITRATE-CITRIC ACID 500-334 MG/5ML PO SOLN: 30.0000 mL | ORAL | Status: DC | PRN

## 2024-04-14 MED ORDER — ACETAMINOPHEN 500 MG PO TABS
1000.0000 mg | ORAL_TABLET | Freq: Four times a day (QID) | ORAL | Status: DC | PRN
  Administered 2024-04-14: 1000 mg via ORAL
  Filled 2024-04-14: qty 2

## 2024-04-14 NOTE — Progress Notes (Signed)
 Patient to be discharged home with labor precautions per [name], [place]. All questions and concerns answered. Patient verbalized understanding.   Melanie [name] [name]

## 2024-04-14 NOTE — Discharge Summary (Signed)
 LABOR & DELIVERY OB TRIAGE NOTE  SUBJECTIVE  HPI Melanie [name] is a 38 y.o. G3P1011 at [redacted]w[redacted]d who presents to Labor & Delivery for back pain. The pain started earlier today, pain comes & goes, is central in her back. Has tried rest, shower, ice without relief.   Tylenol  1000mg  po given & heating pad provided.  OB History     Gravida  3   Para  1   Term  1   Preterm  0   AB  1   Living  1      SAB  0   IAB  1   Ectopic  0   Multiple  0   Live Births  1        Obstetric Comments  Being see @ [place] due to High Risk pregnancy - weight related. [name], [name]          Scheduled Meds: Continuous Infusions:  lactated ringers      PRN Meds:.acetaminophen , lactated ringers , ondansetron , sodium citrate-citric acid   OBJECTIVE  BP (!) 111/57   Pulse 64   Temp 98.5 F (36.9 C) (Oral)   Resp 18   Ht 5' 4 (1.626 m)   Wt 135.6 kg   LMP 07/21/2023 (Exact Date)   BMI 51.32 kg/m   General: A&Ox4, NAD Heart: regular rate Lungs: normal work of breathing Abdomen: normal work of breathing Cervical exam: Dilation: Fingertip Effacement (%): 30 Cervical Position: Posterior Exam by:: DOROTHA Cisco, CNM   NST I reviewed the NST and it was reactive.  Baseline: 135 Variability: moderate Accelerations: present Decelerations:variable x1 Toco: occasional, irregular Category I  No results found for this or any previous visit (from the past 24 hours).  No results found.  ASSESSMENT Impression  1) Pregnancy at G3P1011, [redacted]w[redacted]d, Estimated Date of Delivery: 04/26/24 2) Reassuring maternal/fetal status 3) No evidence of active labor 4) Musculoskeletal pain of pregnancy  PLAN Tylenol  & heating pad provided with some relief. Small amount of cervical change, however cervix remains posterior. Labor & fetal movement precautions reinforced. May take benadryl 25-50mg  to assist with rest overnight. Maintain next visit as scheduled. [name] LITTIE Cisco, CNM 04/14/24  8:22 PM

## 2024-04-14 NOTE — OB Triage Note (Signed)
 Patient is a G3P1011 at [redacted]w[redacted]d who presents to unit c/o backaches and abdominal pain that started since around 1000 today. Reports +fetal movement, denies leakage of fluid and vaginal bleeding. External monitors applied and assessing. Initial FHT 130. Vital signs WDL.

## 2024-04-17 NOTE — Assessment & Plan Note (Signed)
 Doing well. BP normal. TWG 19 lb (8.618 kg). Taking prenatal vitamin daily and aspirin  81 mg daily. Next prenatal appointment in 1 weeks.   Next MFM appointment tomorrow, 9/03. Will continue to follow [facility] for S/D discrepancy.  Tolerating aspirin  well.

## 2024-04-17 NOTE — Progress Notes (Signed)
  [facility] [address], [address] [place] [address] Main phone: [phone]  Prenatal Visit  Subjective:  Melanie [name] is a 38 y.o. G3P1011 at [redacted]w[redacted]d being seen today for ongoing prenatal care.  She is currently monitored for the following issues for this high-risk pregnancy:   Patient Active Problem List   Diagnosis Date Noted   Back pain affecting pregnancy 04/14/2024   Positive GBS test 04/10/2024   COVID-19 affecting pregnancy in third trimester 03/21/2024   Pelvic pain affecting pregnancy 03/08/2024   Abnormal glucose tolerance test in pregnancy 03/02/2024   Uterine size-date discrepancy in third trimester 02/16/2024   Abnormal Pap smear of cervix 01/04/2024   Supervision of high-risk pregnancy, unspecified trimester 10/15/2023   Antepartum multigravida of advanced maternal age - 45 at [facility] 10/15/2023   Maternal morbid obesity, antepartum (HCC) Pregrav BMI 46.6 10/15/2023   Nausea and vomiting 05/03/2015   Patient reports no complaints.  Contractions: Not present. Vag. Bleeding: None.  Movement: Present. Denies leaking of fluid/ROM.   The following portions of the patient's history were reviewed and updated as appropriate: allergies, current medications, past family history, past medical history, past social history, past surgical history and problem list. Problem list updated.  Objective:   Vitals:   04/05/24 1019  BP: 125/81  Pulse: 70  Temp: (!) 97.3 F (36.3 C)  Weight: 299 lb (135.6 kg)   Fetal Status:     Movement: Present  Presentation: Vertex  General:  Alert, oriented and cooperative. Patient is in no acute distress.  Skin: Skin is warm and dry. No rash noted.   Cardiovascular: Normal heart rate noted  Respiratory: Normal respiratory effort, no problems with respiration noted  Abdomen: Soft, gravid, appropriate for gestational age.  Pain/Pressure: Absent     Pelvic: Cervical exam  deferred        Extremities: Normal range of motion.     Mental Status: Normal mood and affect. Normal behavior. Normal judgment and thought content.   Assessment and Plan:  Pregnancy: G3P1011 at [redacted]w[redacted]d  [redacted] weeks gestation of pregnancy  Supervision of high-risk pregnancy, unspecified trimester Assessment & Plan: Doing well. BP normal. TWG 19 lb (8.618 kg). Taking prenatal vitamin daily and aspirin  81 mg daily. Next prenatal appointment in 1 weeks.   Next MFM appointment tomorrow, 9/03. Will continue to follow [facility] for S/D discrepancy.  Tolerating aspirin  well.    Orders: -     Chlamydia/GC NAA, Confirmation -     Culture, beta strep (group b only)  Uterine size-date discrepancy in third trimester  Maternal morbid obesity, antepartum (HCC) Pregrav BMI 46.6  Antepartum multigravida of advanced maternal age - 74 at [facility]   Preterm labor symptoms and general obstetric precautions including but not limited to vaginal bleeding, contractions, leaking of fluid and fetal movement were reviewed in detail with the patient. Please refer to After Visit Summary for other counseling recommendations.  No follow-ups on file.  Future Appointments  Date Time Provider Department Center  04/18/2024  8:20 AM [facility] PROVIDER [facility] None   Betsey CHRISTELLA Helling, MD

## 2024-04-18 ENCOUNTER — Ambulatory Visit: Admitting: Family Medicine

## 2024-04-18 ENCOUNTER — Encounter: Payer: Self-pay | Admitting: Family Medicine

## 2024-04-18 VITALS — BP 107/69 | HR 69 | Temp 97.5°F | Wt 299.8 lb

## 2024-04-18 DIAGNOSIS — O0990 Supervision of high risk pregnancy, unspecified, unspecified trimester: Secondary | ICD-10-CM

## 2024-04-18 DIAGNOSIS — O09529 Supervision of elderly multigravida, unspecified trimester: Secondary | ICD-10-CM

## 2024-04-18 DIAGNOSIS — D649 Anemia, unspecified: Secondary | ICD-10-CM | POA: Insufficient documentation

## 2024-04-18 DIAGNOSIS — O09523 Supervision of elderly multigravida, third trimester: Secondary | ICD-10-CM

## 2024-04-18 DIAGNOSIS — O99213 Obesity complicating pregnancy, third trimester: Secondary | ICD-10-CM

## 2024-04-18 DIAGNOSIS — D509 Iron deficiency anemia, unspecified: Secondary | ICD-10-CM | POA: Insufficient documentation

## 2024-04-18 DIAGNOSIS — Z3A38 38 weeks gestation of pregnancy: Secondary | ICD-10-CM

## 2024-04-18 DIAGNOSIS — O0993 Supervision of high risk pregnancy, unspecified, third trimester: Secondary | ICD-10-CM

## 2024-04-18 MED ORDER — IRON (FERROUS SULFATE) 325 (65 FE) MG PO TABS: 325.0000 mg | ORAL_TABLET | Freq: Every day | ORAL | Status: DC

## 2024-04-18 MED ORDER — IRON (FERROUS SULFATE) 325 (65 FE) MG PO TABS: 325.0000 mg | ORAL_TABLET | ORAL | Status: DC

## 2024-04-18 NOTE — Progress Notes (Addendum)
 Counseled on recommendation for flu vaccine in pregnancy and vaccine declined. Kept 03/2024 and 04/2024 scheduled appts with [facility] MFM.  Remains undecided as to post-partum BCM and verified has pamphlet previously given. Burnadette Lowers, RN Iron  initiated every other day per order of Dr. Macario. Client counseled to take iron  every other day with a vitamin C beverage and to continue until 6 week post-partum appt. Counseled to continue prenatal vitamin daily and to take iron  at least 2 hours before / after taking vitamin. Iron  rich food encouraged and pamphlet given. IOL referral and snapshot pages faxed to [facility] with fax confirmation received. Per Dr. Macario, 1 week follow-up may be on 04/27/24 as no [facility] appts on 9/22 due to Centering and client cares for her father on Tuesdays. [facility] RV appt scheduled and reminder card given. Burnadette Lowers, RN

## 2024-04-18 NOTE — Patient Instructions (Signed)
 Pregnancy Continue taking your prenatal vitamin daily and aspirin  81 mg daily.  START iron  every other day.  Postpartum appointment - routine is at 6 weeks after delivery.  If you need one sooner for blood pressure, mood, or any other issues - we are happy to see you!   Please visit WIC (women's, infants, and children program) to see about their breast feeding peer support program. You can start this program to get tips and tricks for successful breast feeding of your baby.   Prenatal Classes If delivering at [facility] with [facility] or [facility]: Go to [email or web address]   If delivering at [facility]: Go to [email or web address] and search for: Pregnancy and Parenting Classes Prepared Childbirth Classes  Resource regarding exposures that could affect pregnancy: Mother To [employer] is a Dentist with lots of information on the effects of many medications and exposures on your pregnancy. It is free to use, including their web site, phone line, text service, app, or email and live chat.  [email or web address]-[email or web address]/ Email or live chat: MotherToBaby.org Phone: [phone] Text: (782) [phone] App: search [employer]   Maternal Mental Health If you start to develop the below symptoms of depression, please reach out to us  for an appointment. There is also a Biomedical scientist Health Hotline at 8255559670 [phone]). This hotline has trained counselors, doulas, and midwifes to real-time support, information, and resources.  Feeling sad or hopeless most of the time Lack of interest in things you used to enjoy Less interest in caring for yourself (dressing, fixing hair) Trouble concentrating Trouble coping with daily tasks Constant worry about your baby Sleeping or eating too much or too little Feeling very anxious or nervous Unexplained irritability or anger Unwanted or scary  thoughts Feeling that you are not a good mother Thoughts of hurting yourself or your baby  If you feel you are experiencing a mental health crisis, please reach out to the National Suicide Prevention Hotline at 1-800-273-TALK 252-502-7699).

## 2024-04-18 NOTE — Progress Notes (Signed)
  [facility] [address], [address] [place] [address] Main phone: [phone]  Prenatal Visit  Subjective:  Melanie [name] is a 38 y.o. G3P1011 at [redacted]w[redacted]d being seen today for ongoing prenatal care.  She is currently monitored for the following issues for this high-risk pregnancy:   Patient Active Problem List   Diagnosis Date Noted   Anemia 04/18/2024   Back pain affecting pregnancy 04/14/2024   Positive GBS test 04/10/2024   COVID-19 affecting pregnancy in third trimester 03/21/2024   Pelvic pain affecting pregnancy 03/08/2024   Abnormal glucose tolerance test in pregnancy 03/02/2024   Abnormal Pap smear of cervix 01/04/2024   Supervision of high-risk pregnancy, unspecified trimester 10/15/2023   Antepartum multigravida of advanced maternal age - 70 at [facility] 10/15/2023   Maternal morbid obesity, antepartum (HCC) Pregrav BMI 46.6 10/15/2023   Nausea and vomiting 05/03/2015   Patient reports backache.  Contractions: Not present. Vag. Bleeding: None.  Movement: Present. Denies leaking of fluid/ROM.   The following portions of the patient's history were reviewed and updated as appropriate: allergies, current medications, past family history, past medical history, past social history, past surgical history and problem list. Problem list updated.  Objective:   Vitals:   04/18/24 0817  BP: 107/69  Pulse: 69  Temp: (!) 97.5 F (36.4 C)  Weight: 299 lb 12.8 oz (136 kg)   Fetal Status: Fetal Heart Rate (bpm): 136 Fundal Height: 39 cm Movement: Present  Presentation: Vertex  General:  Alert, oriented and cooperative. Patient is in no acute distress.  Skin: Skin is warm and dry. No rash noted.   Cardiovascular: Normal heart rate noted  Respiratory: Normal respiratory effort, no problems with respiration noted  Abdomen: Soft, gravid, appropriate for gestational age.  Pain/Pressure: Present (Back pain)      Pelvic: Cervical exam deferred        Extremities: Normal range of motion.     Mental Status: Normal mood and affect. Normal behavior. Normal judgment and thought content.   Assessment and Plan:  Pregnancy: G3P1011 at [redacted]w[redacted]d  [redacted] weeks gestation of pregnancy  Supervision of high-risk pregnancy, unspecified trimester Assessment & Plan: Lots of back pain, patient is quite uncomfortable. Unchanged from ED 9/11. No contractions.  BP normal. TWG 19 lb 12.8 oz (8.981 kg). Taking prenatal vitamin daily and aspirin  81 mg daily. Per MFM, induce 39-40 weeks (AMA, obesity) - patient desires induction asap.  - paper IOL form completed and given to RN to fax to [facility] - postpartum at 6 weeks (sooner if needed) - new mild anemia; anemia panel today, will initiate PO iron  every other day, to be continued through 6 weeks postpartum    Anemia, unspecified type -     Anemia panel -     Iron  (Ferrous Sulfate ); Take 325 mg by mouth daily.  Maternal morbid obesity, antepartum (HCC) Pregrav BMI 46.6  Antepartum multigravida of advanced maternal age - 56 at [facility]  Term labor symptoms and general obstetric precautions including but not limited to vaginal bleeding, contractions, leaking of fluid and fetal movement were reviewed in detail with the patient. Please refer to After Visit Summary for other counseling recommendations.  No follow-ups on file.  No future appointments.  Betsey CHRISTELLA Helling, MD

## 2024-04-18 NOTE — Assessment & Plan Note (Signed)
 Lots of back pain, patient is quite uncomfortable. Unchanged from ED 9/11. No contractions.  BP normal. TWG 19 lb 12.8 oz (8.981 kg). Taking prenatal vitamin daily and aspirin  81 mg daily. Per MFM, induce 39-40 weeks (AMA, obesity) - patient desires induction asap.  - paper IOL form completed and given to RN to fax to [facility] - postpartum at 6 weeks (sooner if needed) - new mild anemia; anemia panel today, will initiate PO iron  every other day, to be continued through 6 weeks postpartum

## 2024-04-19 ENCOUNTER — Ambulatory Visit: Payer: Self-pay | Admitting: Family Medicine

## 2024-04-19 ENCOUNTER — Encounter: Payer: Self-pay | Admitting: Family Medicine

## 2024-04-19 LAB — ANEMIA PANEL
Ferritin: 18 ng/mL (ref 15–150)
Folate, Hemolysate: 499 ng/mL
Folate, RBC: 1367 ng/mL (ref >498)
Hematocrit: 36.5 % (ref 34.0–46.6)
Iron Saturation: 12 % — ABNORMAL LOW (ref 15–55)
Iron: 53 ug/dL (ref 27–159)
Retic Ct Pct: 1.8 % (ref 0.6–2.6)
Total Iron Binding Capacity: 444 ug/dL (ref 250–450)
UIBC: 391 ug/dL (ref 131–425)
Vitamin B-12: 283 pg/mL (ref 232–1245)

## 2024-04-19 NOTE — Progress Notes (Signed)
 Ferritin <30 consistent with iron  deficiency anemia. Already started on PO iron  every other day. Next check at delivery.   Dorothyann Helling, MD 04/19/24  5:21 PM

## 2024-04-20 ENCOUNTER — Telehealth: Payer: Self-pay

## 2024-04-20 NOTE — Telephone Encounter (Signed)
 Call transferred to clinic at request of this RN by Dwayne, [facility] intake clerk. Per client, she is having a lot of back pain and vaginal pressure. States it feels like she needs to have a BM, but nothing comes out. Also states having a lot of vaginal pressure when sits down to urinate. Encouraged evaluation at L & D if pain severe.Understanding verbalized. Client also questioning if IOL has been scheduled. Counseled RN will call [facility] concerning IOL. Per call to [facility], a My Chart message was sent to client regarding induction. Client has IOL scheduled for 04/25/24 at 0500. Return call to client with IOL information and counseled to review My Chart message. Burnadette Lowers, RN

## 2024-04-22 ENCOUNTER — Observation Stay
Admission: EM | Admit: 2024-04-22 | Discharge: 2024-04-22 | Disposition: A | Discharge status: Home / Self Care | Attending: Certified Nurse Midwife | Admitting: Certified Nurse Midwife

## 2024-04-22 DIAGNOSIS — O26893 Other specified pregnancy related conditions, third trimester: Principal | ICD-10-CM | POA: Insufficient documentation

## 2024-04-22 DIAGNOSIS — R102 Pelvic and perineal pain: Principal | ICD-10-CM | POA: Insufficient documentation

## 2024-04-22 DIAGNOSIS — Z3A39 39 weeks gestation of pregnancy: Secondary | ICD-10-CM | POA: Insufficient documentation

## 2024-04-22 NOTE — OB Triage Note (Incomplete)
 LABOR & DELIVERY OB TRIAGE NOTE  SUBJECTIVE  HPI Melanie [name] is a 38 y.o. G3P1011 at [redacted]w[redacted]d who presents to Labor & Delivery for pelvic pain and pressure.  OB History     Gravida  3   Para  1   Term  1   Preterm  0   AB  1   Living  1      SAB  0   IAB  1   Ectopic  0   Multiple  0   Live Births  1        Obstetric Comments  Being see @ [place] due to High Risk pregnancy - weight related. Melanie [name]          OBJECTIVE  LMP 07/21/2023 (Exact Date)   General: alert and oriented Cervical exam: Dilation: Fingertip Effacement (%): Thick Exam by:: CNM studemt   NST I reviewed the NST and it was reactive.  Baseline:  Variability: moderate Accelerations: 15x15 present Decelerations:{obgyn decelerations:[id number]} Toco: none Category 1  ASSESSMENT Impression  1) Pregnancy at G3P1011, [redacted]w[redacted]d, Estimated Date of Delivery: 04/26/24 2) Reassuring maternal/fetal status 3) Pelvic pain in pregnancy, unchanged x1 week  PLAN Vedanshi will be discharged home in stable condition. Reports that she has Flexeril  at home. Encouraged to take Tylenol , warm bath/shower, wear abdominal binder if comfortable.    Kennth Vanbenschoten E Joycelyn Liska, Student-MidWife  04/22/24  7:28 PM

## 2024-04-24 ENCOUNTER — Encounter: Payer: Self-pay | Admitting: Certified Nurse Midwife

## 2024-04-25 ENCOUNTER — Encounter
Admission: EM | Disposition: A | Discharge status: Home / Self Care | Payer: Self-pay | Source: Home / Self Care | Attending: Certified Nurse Midwife

## 2024-04-25 ENCOUNTER — Inpatient Hospital Stay: Admitting: Anesthesiology

## 2024-04-25 ENCOUNTER — Other Ambulatory Visit: Payer: Self-pay

## 2024-04-25 ENCOUNTER — Inpatient Hospital Stay
Admission: EM | Admit: 2024-04-25 | Discharge: 2024-04-28 | DRG: 787 | Disposition: A | Discharge status: Home / Self Care | Attending: Certified Nurse Midwife | Admitting: Certified Nurse Midwife

## 2024-04-25 ENCOUNTER — Encounter: Payer: Self-pay | Admitting: Obstetrics and Gynecology

## 2024-04-25 DIAGNOSIS — Z87891 Personal history of nicotine dependence: Secondary | ICD-10-CM | POA: Diagnosis not present

## 2024-04-25 DIAGNOSIS — O9902 Anemia complicating childbirth: Secondary | ICD-10-CM | POA: Diagnosis not present

## 2024-04-25 DIAGNOSIS — Z8616 Personal history of COVID-19: Secondary | ICD-10-CM

## 2024-04-25 DIAGNOSIS — O9903 Anemia complicating the puerperium: Secondary | ICD-10-CM | POA: Diagnosis not present

## 2024-04-25 DIAGNOSIS — O99824 Streptococcus B carrier state complicating childbirth: Secondary | ICD-10-CM | POA: Diagnosis present

## 2024-04-25 DIAGNOSIS — Z8249 Family history of ischemic heart disease and other diseases of the circulatory system: Secondary | ICD-10-CM

## 2024-04-25 DIAGNOSIS — O9962 Diseases of the digestive system complicating childbirth: Secondary | ICD-10-CM | POA: Diagnosis present

## 2024-04-25 DIAGNOSIS — D62 Acute posthemorrhagic anemia: Secondary | ICD-10-CM | POA: Diagnosis not present

## 2024-04-25 DIAGNOSIS — O09529 Supervision of elderly multigravida, unspecified trimester: Secondary | ICD-10-CM

## 2024-04-25 DIAGNOSIS — K219 Gastro-esophageal reflux disease without esophagitis: Secondary | ICD-10-CM | POA: Diagnosis present

## 2024-04-25 DIAGNOSIS — O99214 Obesity complicating childbirth: Secondary | ICD-10-CM | POA: Diagnosis not present

## 2024-04-25 DIAGNOSIS — Z3A39 39 weeks gestation of pregnancy: Secondary | ICD-10-CM

## 2024-04-25 DIAGNOSIS — O99810 Abnormal glucose complicating pregnancy: Secondary | ICD-10-CM

## 2024-04-25 DIAGNOSIS — O0990 Supervision of high risk pregnancy, unspecified, unspecified trimester: Principal | ICD-10-CM

## 2024-04-25 DIAGNOSIS — O99210 Obesity complicating pregnancy, unspecified trimester: Secondary | ICD-10-CM | POA: Diagnosis present

## 2024-04-25 DIAGNOSIS — O99820 Streptococcus B carrier state complicating pregnancy: Secondary | ICD-10-CM

## 2024-04-25 DIAGNOSIS — O0993 Supervision of high risk pregnancy, unspecified, third trimester: Secondary | ICD-10-CM | POA: Diagnosis not present

## 2024-04-25 DIAGNOSIS — D509 Iron deficiency anemia, unspecified: Secondary | ICD-10-CM | POA: Diagnosis present

## 2024-04-25 DIAGNOSIS — B951 Streptococcus, group B, as the cause of diseases classified elsewhere: Secondary | ICD-10-CM | POA: Diagnosis present

## 2024-04-25 DIAGNOSIS — Z833 Family history of diabetes mellitus: Secondary | ICD-10-CM | POA: Diagnosis not present

## 2024-04-25 LAB — CBC
HCT: 35.3 % — ABNORMAL LOW (ref 36.0–46.0)
Hemoglobin: 11.6 g/dL — ABNORMAL LOW (ref 12.0–15.0)
MCH: 26.5 pg (ref 26.0–34.0)
MCHC: 32.9 g/dL (ref 30.0–36.0)
MCV: 80.8 fL (ref 80.0–100.0)
Platelets: 268 K/uL (ref 150–400)
RBC: 4.37 MIL/uL (ref 3.87–5.11)
RDW: 13.8 % (ref 11.5–15.5)
WBC: 11.9 K/uL — ABNORMAL HIGH (ref 4.0–10.5)
nRBC: 0 % (ref 0.0–0.2)

## 2024-04-25 LAB — RPR: RPR Ser Ql: NONREACTIVE

## 2024-04-25 SURGERY — Surgical Case
Anesthesia: Epidural

## 2024-04-25 MED ORDER — MENTHOL 3 MG MT LOZG: 1.0000 | LOZENGE | OROMUCOSAL | Status: DC | PRN

## 2024-04-25 MED ORDER — OXYTOCIN-SODIUM CHLORIDE 30-0.9 UT/500ML-% IV SOLN
2.5000 [IU]/h | INTRAVENOUS | Status: DC
  Administered 2024-04-25: 30 [IU] via INTRAVENOUS

## 2024-04-25 MED ORDER — EPHEDRINE SULFATE-NACL 50-0.9 MG/10ML-% IV SOSY
PREFILLED_SYRINGE | INTRAVENOUS | Status: DC | PRN
  Administered 2024-04-25 (×2): 5 mg via INTRAVENOUS

## 2024-04-25 MED ORDER — FENTANYL-BUPIVACAINE-NACL 0.5-0.125-0.9 MG/250ML-% EP SOLN
12.0000 mL/h | EPIDURAL | Status: DC | PRN
  Administered 2024-04-25: 12 mL/h via EPIDURAL

## 2024-04-25 MED ORDER — LACTATED RINGERS IV SOLN
500.0000 mL | INTRAVENOUS | Status: DC | PRN
  Administered 2024-04-25: 1000 mL via INTRAVENOUS

## 2024-04-25 MED ORDER — DEXAMETHASONE SODIUM PHOSPHATE 10 MG/ML IJ SOLN
INTRAMUSCULAR | Status: AC
  Filled 2024-04-25: qty 1

## 2024-04-25 MED ORDER — SIMETHICONE 80 MG PO CHEW
80.0000 mg | CHEWABLE_TABLET | Freq: Three times a day (TID) | ORAL | Status: DC
  Administered 2024-04-26 – 2024-04-28 (×7): 80 mg via ORAL
  Filled 2024-04-25 (×7): qty 1

## 2024-04-25 MED ORDER — PHENYLEPHRINE 80 MCG/ML (10ML) SYRINGE FOR IV PUSH (FOR BLOOD PRESSURE SUPPORT): 80.0000 ug | PREFILLED_SYRINGE | INTRAVENOUS | Status: DC | PRN

## 2024-04-25 MED ORDER — SODIUM CHLORIDE 0.9 % IV SOLN
500.0000 mg | INTRAVENOUS | Status: AC
  Administered 2024-04-25: 500 mg via INTRAVENOUS

## 2024-04-25 MED ORDER — LACTATED RINGERS IV SOLN
500.0000 mL | Freq: Once | INTRAVENOUS | Status: AC
  Administered 2024-04-25: 500 mL via INTRAVENOUS

## 2024-04-25 MED ORDER — LIDOCAINE HCL (PF) 2 % IJ SOLN
INTRAMUSCULAR | Status: AC
  Filled 2024-04-25: qty 10

## 2024-04-25 MED ORDER — EPHEDRINE 5 MG/ML INJ
INTRAVENOUS | Status: AC
  Filled 2024-04-25: qty 5

## 2024-04-25 MED ORDER — WITCH HAZEL-GLYCERIN EX PADS: 1.0000 | MEDICATED_PAD | CUTANEOUS | Status: DC | PRN

## 2024-04-25 MED ORDER — OXYTOCIN-SODIUM CHLORIDE 30-0.9 UT/500ML-% IV SOLN: 1.0000 m[IU]/min | INTRAVENOUS | Status: DC

## 2024-04-25 MED ORDER — TERBUTALINE SULFATE 1 MG/ML IJ SOLN
0.2500 mg | Freq: Once | INTRAMUSCULAR | Status: AC | PRN
  Administered 2024-04-25: 0.25 mg via SUBCUTANEOUS
  Filled 2024-04-25: qty 1

## 2024-04-25 MED ORDER — OXYTOCIN-SODIUM CHLORIDE 30-0.9 UT/500ML-% IV SOLN
INTRAVENOUS | Status: AC
  Filled 2024-04-25: qty 500

## 2024-04-25 MED ORDER — EPHEDRINE 5 MG/ML INJ
10.0000 mg | INTRAVENOUS | Status: AC | PRN
  Administered 2024-04-25 (×2): 10 mg via INTRAVENOUS
  Filled 2024-04-25: qty 5

## 2024-04-25 MED ORDER — LIDOCAINE-EPINEPHRINE (PF) 1.5 %-1:200000 IJ SOLN
INTRAMUSCULAR | Status: DC | PRN
  Administered 2024-04-25: 3 mL via EPIDURAL

## 2024-04-25 MED ORDER — ENOXAPARIN SODIUM 40 MG/0.4ML IJ SOSY
40.0000 mg | PREFILLED_SYRINGE | INTRAMUSCULAR | Status: DC
  Administered 2024-04-27 – 2024-04-28 (×2): 40 mg via SUBCUTANEOUS
  Filled 2024-04-25 (×2): qty 0.4

## 2024-04-25 MED ORDER — BUPIVACAINE HCL (PF) 0.5 % IJ SOLN
INTRAMUSCULAR | Status: DC | PRN
  Administered 2024-04-25: 30 mL

## 2024-04-25 MED ORDER — ONDANSETRON HCL 4 MG/2ML IJ SOLN
INTRAMUSCULAR | Status: DC | PRN
  Administered 2024-04-25: 4 mg via INTRAVENOUS

## 2024-04-25 MED ORDER — BUPIVACAINE HCL (PF) 0.25 % IJ SOLN
INTRAMUSCULAR | Status: DC | PRN
  Administered 2024-04-25 (×2): 5 mL via EPIDURAL

## 2024-04-25 MED ORDER — MISOPROSTOL 50MCG HALF TABLET
50.0000 ug | ORAL_TABLET | Freq: Once | ORAL | Status: AC
  Administered 2024-04-25: 50 ug via VAGINAL

## 2024-04-25 MED ORDER — MISOPROSTOL 50MCG HALF TABLET
50.0000 ug | ORAL_TABLET | ORAL | Status: DC | PRN
  Filled 2024-04-25: qty 1

## 2024-04-25 MED ORDER — OXYCODONE HCL 5 MG PO TABS
5.0000 mg | ORAL_TABLET | ORAL | Status: DC | PRN
  Administered 2024-04-26: 5 mg via ORAL
  Administered 2024-04-26 – 2024-04-28 (×9): 10 mg via ORAL
  Filled 2024-04-25 (×4): qty 2
  Filled 2024-04-25: qty 1
  Filled 2024-04-25 (×5): qty 2

## 2024-04-25 MED ORDER — LIDOCAINE 2% (20 MG/ML) 5 ML SYRINGE
INTRAMUSCULAR | Status: DC | PRN
  Administered 2024-04-25 (×2): 5 mL via INTRAVENOUS

## 2024-04-25 MED ORDER — FENTANYL-BUPIVACAINE-NACL 0.5-0.125-0.9 MG/250ML-% EP SOLN
EPIDURAL | Status: AC
  Filled 2024-04-25: qty 250

## 2024-04-25 MED ORDER — CEFAZOLIN SODIUM-DEXTROSE 3-4 GM/150ML-% IV SOLN
3.0000 g | INTRAVENOUS | Status: AC
  Administered 2024-04-25: 3 g via INTRAVENOUS

## 2024-04-25 MED ORDER — FENTANYL CITRATE (PF) 100 MCG/2ML IJ SOLN
INTRAMUSCULAR | Status: AC
  Filled 2024-04-25: qty 2

## 2024-04-25 MED ORDER — MISOPROSTOL 25 MCG QUARTER TABLET
ORAL_TABLET | ORAL | Status: AC
  Filled 2024-04-25: qty 1

## 2024-04-25 MED ORDER — EPHEDRINE 5 MG/ML INJ: 10.0000 mg | INTRAVENOUS | Status: DC | PRN

## 2024-04-25 MED ORDER — IBUPROFEN 600 MG PO TABS
600.0000 mg | ORAL_TABLET | Freq: Four times a day (QID) | ORAL | Status: DC
  Administered 2024-04-26 – 2024-04-27 (×6): 600 mg via ORAL
  Filled 2024-04-25 (×6): qty 1

## 2024-04-25 MED ORDER — LIDOCAINE HCL (PF) 1 % IJ SOLN
INTRAMUSCULAR | Status: DC | PRN
  Administered 2024-04-25: 3 mL via INTRADERMAL

## 2024-04-25 MED ORDER — MORPHINE SULFATE (PF) 0.5 MG/ML IJ SOLN
INTRAMUSCULAR | Status: AC
  Filled 2024-04-25: qty 10

## 2024-04-25 MED ORDER — MISOPROSTOL 25 MCG QUARTER TABLET
25.0000 ug | ORAL_TABLET | Freq: Once | ORAL | Status: DC
  Administered 2024-04-25: 25 ug via ORAL

## 2024-04-25 MED ORDER — LACTATED RINGERS AMNIOINFUSION
INTRAVENOUS | Status: DC
  Filled 2024-04-25 (×2): qty 1000

## 2024-04-25 MED ORDER — LIDOCAINE HCL (PF) 1 % IJ SOLN: 30.0000 mL | INTRAMUSCULAR | Status: DC | PRN

## 2024-04-25 MED ORDER — FENTANYL CITRATE (PF) 100 MCG/2ML IJ SOLN
INTRAMUSCULAR | Status: DC | PRN
  Administered 2024-04-25: 100 ug via EPIDURAL

## 2024-04-25 MED ORDER — ONDANSETRON HCL 4 MG/2ML IJ SOLN: 4.0000 mg | Freq: Four times a day (QID) | INTRAMUSCULAR | Status: DC | PRN

## 2024-04-25 MED ORDER — SODIUM CHLORIDE 0.9 % IV SOLN
INTRAVENOUS | Status: AC
  Filled 2024-04-25: qty 5

## 2024-04-25 MED ORDER — CEFAZOLIN SODIUM-DEXTROSE 3-4 GM/150ML-% IV SOLN
INTRAVENOUS | Status: AC
  Filled 2024-04-25: qty 150

## 2024-04-25 MED ORDER — ACETAMINOPHEN 500 MG PO TABS
1000.0000 mg | ORAL_TABLET | Freq: Four times a day (QID) | ORAL | Status: DC
  Administered 2024-04-26 – 2024-04-28 (×10): 1000 mg via ORAL
  Filled 2024-04-25 (×11): qty 2

## 2024-04-25 MED ORDER — DIPHENHYDRAMINE HCL 25 MG PO CAPS: 25.0000 mg | ORAL_CAPSULE | Freq: Four times a day (QID) | ORAL | Status: DC | PRN

## 2024-04-25 MED ORDER — MORPHINE SULFATE (PF) 0.5 MG/ML IJ SOLN
INTRAMUSCULAR | Status: DC | PRN
  Administered 2024-04-25: 2.5 mg via EPIDURAL

## 2024-04-25 MED ORDER — DEXAMETHASONE SODIUM PHOSPHATE 4 MG/ML IJ SOLN
INTRAMUSCULAR | Status: DC | PRN
  Administered 2024-04-25: 10 mg via INTRAVENOUS

## 2024-04-25 MED ORDER — ZOLPIDEM TARTRATE 5 MG PO TABS: 5.0000 mg | ORAL_TABLET | Freq: Every evening | ORAL | Status: DC | PRN

## 2024-04-25 MED ORDER — TERBUTALINE SULFATE 1 MG/ML IJ SOLN
0.2500 mg | Freq: Once | INTRAMUSCULAR | Status: AC
  Administered 2024-04-25: 0.25 mg via SUBCUTANEOUS

## 2024-04-25 MED ORDER — DIPHENHYDRAMINE HCL 50 MG/ML IJ SOLN: 12.5000 mg | INTRAMUSCULAR | Status: DC | PRN

## 2024-04-25 MED ORDER — ONDANSETRON HCL 4 MG/2ML IJ SOLN
INTRAMUSCULAR | Status: AC
  Filled 2024-04-25: qty 2

## 2024-04-25 MED ORDER — DIBUCAINE (PERIANAL) 1 % EX OINT: 1.0000 | TOPICAL_OINTMENT | CUTANEOUS | Status: DC | PRN

## 2024-04-25 MED ORDER — ACETAMINOPHEN 500 MG PO TABS: 1000.0000 mg | ORAL_TABLET | Freq: Four times a day (QID) | ORAL | Status: DC | PRN

## 2024-04-25 MED ORDER — HYDROXYZINE HCL 25 MG PO TABS: 50.0000 mg | ORAL_TABLET | Freq: Four times a day (QID) | ORAL | Status: DC | PRN

## 2024-04-25 MED ORDER — PENICILLIN G POT IN DEXTROSE 60000 UNIT/ML IV SOLN
3.0000 10*6.[IU] | INTRAVENOUS | Status: DC
  Administered 2024-04-25 (×2): 3 10*6.[IU] via INTRAVENOUS
  Filled 2024-04-25 (×2): qty 50

## 2024-04-25 MED ORDER — FENTANYL CITRATE (PF) 100 MCG/2ML IJ SOLN
50.0000 ug | INTRAMUSCULAR | Status: DC | PRN
  Administered 2024-04-25: 100 ug via INTRAVENOUS
  Filled 2024-04-25: qty 2

## 2024-04-25 MED ORDER — SOD CITRATE-CITRIC ACID 500-334 MG/5ML PO SOLN: 30.0000 mL | ORAL | Status: DC | PRN

## 2024-04-25 MED ORDER — PHENYLEPHRINE 80 MCG/ML (10ML) SYRINGE FOR IV PUSH (FOR BLOOD PRESSURE SUPPORT)
PREFILLED_SYRINGE | INTRAVENOUS | Status: DC | PRN
  Administered 2024-04-25: 80 ug via INTRAVENOUS
  Administered 2024-04-25 (×3): 160 ug via INTRAVENOUS
  Administered 2024-04-25: 80 ug via INTRAVENOUS

## 2024-04-25 MED ORDER — OXYTOCIN-SODIUM CHLORIDE 30-0.9 UT/500ML-% IV SOLN
2.5000 [IU]/h | INTRAVENOUS | Status: AC
  Administered 2024-04-25 (×2): 2.5 [IU]/h via INTRAVENOUS
  Filled 2024-04-25: qty 500

## 2024-04-25 MED ORDER — OXYTOCIN BOLUS FROM INFUSION: 333.0000 mL | Freq: Once | INTRAVENOUS | Status: DC

## 2024-04-25 MED ORDER — PRENATAL MULTIVITAMIN CH
1.0000 | ORAL_TABLET | Freq: Every day | ORAL | Status: DC
  Administered 2024-04-26 – 2024-04-28 (×3): 1 via ORAL
  Filled 2024-04-25 (×3): qty 1

## 2024-04-25 MED ORDER — COCONUT OIL OIL
1.0000 | TOPICAL_OIL | Status: DC | PRN
  Filled 2024-04-25: qty 7.5

## 2024-04-25 MED ORDER — TERBUTALINE SULFATE 1 MG/ML IJ SOLN
INTRAMUSCULAR | Status: AC
  Administered 2024-04-25: 0.25 mg
  Filled 2024-04-25: qty 1

## 2024-04-25 MED ORDER — SENNOSIDES-DOCUSATE SODIUM 8.6-50 MG PO TABS
2.0000 | ORAL_TABLET | Freq: Every day | ORAL | Status: DC
Start: 2024-04-26 — End: 2024-04-28
  Administered 2024-04-26 – 2024-04-28 (×3): 2 via ORAL
  Filled 2024-04-25 (×3): qty 2

## 2024-04-25 MED ORDER — TERBUTALINE SULFATE 1 MG/ML IJ SOLN
0.2500 mg | Freq: Once | INTRAMUSCULAR | Status: AC
Start: 2024-04-25 — End: 2024-04-25
  Administered 2024-04-25: 0.25 mg via SUBCUTANEOUS

## 2024-04-25 MED ORDER — LACTATED RINGERS IV SOLN: INTRAVENOUS | Status: DC

## 2024-04-25 MED ORDER — SOD CITRATE-CITRIC ACID 500-334 MG/5ML PO SOLN
30.0000 mL | ORAL | Status: AC
  Administered 2024-04-25: 30 mL via ORAL

## 2024-04-25 MED ORDER — SODIUM CHLORIDE 0.9 % IV SOLN
5.0000 10*6.[IU] | Freq: Once | INTRAVENOUS | Status: AC
  Administered 2024-04-25: 5 10*6.[IU] via INTRAVENOUS
  Filled 2024-04-25: qty 5

## 2024-04-25 MED ORDER — SIMETHICONE 80 MG PO CHEW: 80.0000 mg | CHEWABLE_TABLET | ORAL | Status: DC | PRN

## 2024-04-25 MED ORDER — KETOROLAC TROMETHAMINE 30 MG/ML IJ SOLN
30.0000 mg | Freq: Once | INTRAMUSCULAR | Status: AC
  Administered 2024-04-25: 30 mg via INTRAVENOUS
  Filled 2024-04-25: qty 1

## 2024-04-25 SURGICAL SUPPLY — 22 items
CHLORAPREP W/TINT 26 (MISCELLANEOUS) IMPLANT
DRESSING PEEL AND PLAC PRVNA20 (GAUZE/BANDAGES/DRESSINGS) IMPLANT
ELECT CAUTERY BLADE 6.4 (BLADE) IMPLANT
ELECTRODE REM PT RTRN 9FT ADLT (ELECTROSURGICAL) IMPLANT
GLOVE SURG LATEX 7.5 PF (GLOVE) IMPLANT
GOWN STRL REUS W/ TWL LRG LVL4 (GOWN DISPOSABLE) IMPLANT
MANIFOLD NEPTUNE II (INSTRUMENTS) IMPLANT
NDL SPNL 22GX3.5 QUINCKE BK (NEEDLE) IMPLANT
NEEDLE SPNL 22GX3.5 QUINCKE BK (NEEDLE) ×1 IMPLANT
NS IRRIG 1000ML POUR BTL (IV SOLUTION) IMPLANT
PACK C SECTION AR (MISCELLANEOUS) IMPLANT
PAD OB MATERNITY 11 LF (PERSONAL CARE ITEMS) IMPLANT
PAD PREP OB/GYN DISP 24X41 (PERSONAL CARE ITEMS) IMPLANT
SCRUB CHG 4% DYNA-HEX 4OZ (MISCELLANEOUS) IMPLANT
STRAP SAFETY RF DISP (MISCELLANEOUS) IMPLANT
STRIP CLOSURE SKIN 1/2X4 (GAUZE/BANDAGES/DRESSINGS) IMPLANT
SUT VIC AB 0 CT1 36 (SUTURE) IMPLANT
SUT VIC AB 0 CTX36XBRD ANBCTRL (SUTURE) IMPLANT
SUT VIC AB 2-0 CT1 (SUTURE) IMPLANT
SUT VICRYL 3-0 27IN (SUTURE) IMPLANT
TRAP FLUID SMOKE EVACUATOR (MISCELLANEOUS) IMPLANT
WATER STERILE IRR 500ML POUR (IV SOLUTION) IMPLANT

## 2024-04-25 NOTE — Progress Notes (Signed)
 LABOR NOTE   Melanie [name] 38 y.o.GP@ at [redacted]w[redacted]d  SUBJECTIVE:  Denies pain at this time Analgesia: Labor support without medications  OBJECTIVE:  BP (!) 107/51 (BP Location: Left Arm)   Pulse 72   Temp 97.8 F (36.6 C) (Oral)   Resp 18   Ht 5' 4 (1.626 m)   Wt 136.1 kg   LMP 07/21/2023 (Exact Date)   BMI 51.49 kg/m  No intake/output data recorded.  She has not shown cervical change. CERVIX: 1 cm:  Long:   -3:   posterior:   firm SVE:   Dilation: Fingertip Effacement (%): 40 Station: -3 Exam by:: Sebastian CNM CONTRACTIONS: irregular,  FHR: Fetal heart tracing reviewed. Baseline: 130 bpm, Variability: Good {> 6 bpm), Accelerations: Reactive, and Decelerations: Absent Category I    Labs: Lab Results  Component Value Date   WBC 11.9 (H) 04/25/2024   HGB 11.6 (L) 04/25/2024   HCT 35.3 (L) 04/25/2024   MCV 80.8 04/25/2024   PLT 268 04/25/2024    ASSESSMENT: 1) Labor curve reviewed.       Progress: Not in labor.     Membranes: intact            Active Problems:   Positive GBS test   Iron  deficiency anemia   Obesity affecting pregnancy   PLAN: expectant management, continue present management, and cytotec  placed oral and vaginally. Dr. Starla aware of plan of care.   Zelda Sebastian, CNM  04/25/2024 8:20 AM

## 2024-04-25 NOTE — Plan of Care (Signed)

## 2024-04-25 NOTE — Progress Notes (Signed)
 LABOR NOTE   Melanie [name] 38 y.o.GP@ at [redacted]w[redacted]d  SUBJECTIVE:  Very uncomfortable with urge to push, pt grunting and bearing down Analgesia: Labor support without medications  OBJECTIVE:  BP (!) 130/55 (BP Location: Left Arm)   Pulse 94   Temp 97.7 F (36.5 C) (Oral)   Resp 18   Ht 5' 4 (1.626 m)   Wt 136.1 kg   LMP 07/21/2023 (Exact Date)   BMI 51.49 kg/m  No intake/output data recorded.  She has shown cervical change. CERVIX: 8cm:   SVE:   Dilation: 8.5 Effacement (%): 100 Station: -1 Exam by:: Dove MD CONTRACTIONS: regular, every 2-3 minutes FHR: Fetal heart tracing reviewed. Baseline: 130 bpm, Variability: Good {> 6 bpm), Accelerations: Non-reactive but appropriate for gestational age, and Decelerations: variable, prolonged decel noted Category I  IUPC placed amnio infusion , FSE placed  Labs: Lab Results  Component Value Date   WBC 11.9 (H) 04/25/2024   HGB 11.6 (L) 04/25/2024   HCT 35.3 (L) 04/25/2024   MCV 80.8 04/25/2024   PLT 268 04/25/2024    ASSESSMENT: 1) Labor curve reviewed.       Progress: Active phase labor.     Membranes: ruptured, clear fluid     IUPC & FSE in place       Active Problems:   Positive GBS test   Iron  deficiency anemia   Obesity affecting pregnancy   PLAN: amnioinfusion PT IV fluid bolus and position changed.   Zelda Hummer, CNM  04/25/2024 4:54 PM

## 2024-04-25 NOTE — H&P (Signed)
 [facility] Labor & Delivery  History and Physical   HPI   Chief Complaint: Induction of labor  Melanie [name] is a 38 y.o. G3P1011 at [redacted]w[redacted]d who presents for induction of labor 2/2 morbid obesity. She received her prenatal care with [facility], and was also seen by MFM.    She initially declined blood products due to family history of a significant reaction that resulted in her cousin almost dying. After discussion with her partner & staff, she states she will accept blood products to save her life.  Patient reports: Fetal movement: usual fetal movement Contractions: painful contractions that began at ~5am Loss/leakage of Fluid: denies Vaginal bleeding: denies  Pregnancy Complications Patient Active Problem List   Diagnosis Date Noted   Obesity affecting pregnancy 04/25/2024   Pelvic pain 04/22/2024   Iron  deficiency anemia 04/18/2024   Back pain affecting pregnancy 04/14/2024   Positive GBS test 04/10/2024   COVID-19 affecting pregnancy in third trimester 03/21/2024   Pelvic pain affecting pregnancy 03/08/2024   Abnormal glucose tolerance test in pregnancy 03/02/2024   Abnormal Pap smear of cervix 01/04/2024   Supervision of high-risk pregnancy, unspecified trimester 10/15/2023   Antepartum multigravida of advanced maternal age - 60 at [facility] 10/15/2023   Maternal morbid obesity, antepartum (HCC) Pregrav BMI 46.6 10/15/2023   Nausea and vomiting 05/03/2015    Review of Systems A twelve point review of systems was negative except as stated in HPI.   HISTORY   Medications Medications Prior to Admission  Medication Sig Dispense Refill Last Dose/Taking   acetaminophen  (TYLENOL ) 500 MG tablet Take 2 tablets (1,000 mg total) by mouth every 6 (six) hours as needed for mild pain (pain score 1-3). 30 tablet 0 Past Week   aspirin  EC 81 MG tablet Take 1 tablet (81 mg total) by mouth daily. Take after 12 weeks for prevention of preeclampsia later in  pregnancy   04/24/2024   Iron , Ferrous Sulfate , 325 (65 Fe) MG TABS Take 325 mg by mouth every other day.   Past Week   Prenatal Vit-Fe Fumarate-FA (PRENATAL PO) Take by mouth.   04/24/2024   albuterol  (VENTOLIN  HFA) 108 (90 Base) MCG/ACT inhaler Inhale 2 puffs into the lungs every 4 (four) hours as needed. (Patient not taking: Reported on 04/18/2024) 18 g 0 Not Taking   Spacer/Aero-Holding Chambers (AEROCHAMBER MV) inhaler Use as instructed (Patient not taking: Reported on 04/18/2024) 1 each 2 Not Taking    Allergies has no known allergies.   OB History OB History  Gravida Para Term Preterm AB Living  3 1 1  0 1 1  SAB IAB Ectopic Multiple Live Births  0 1 0 0 1    # Outcome Date GA Lbr Len/2nd Weight Sex Type Anes PTL Lv  3 Current           2 Term 06/10/15 [redacted]w[redacted]d  3572 g F Vag-Spont   LIV  1 IAB 2009            Obstetric Comments  Being see @ [facility] due to High Risk pregnancy - weight related. Marijo Merribeth RAMAN    Past Medical History Past Medical History:  Diagnosis Date   Absolute anemia 02/22/2015   Anemia    during pregnancy   Elevated blood pressure affecting pregnancy, antepartum 11/09/2023   Gall bladder disease 2016   Obesity affecting pregnancy    Dermoid Cyst   Pneumonia    2023 and 2025   Uterine size-date discrepancy in third  trimester 02/16/2024    Past Surgical History Past Surgical History:  Procedure Laterality Date   CHOLECYSTECTOMY  2017   OVARIAN CYST REMOVAL  2012   SALPINGECTOMY Left 2024    Social History  reports that she quit smoking about 8 months ago. Her smoking use included cigarettes. She has been exposed to tobacco smoke. She has never used smokeless tobacco. She reports that she does not currently use alcohol. She reports that she does not currently use drugs after having used the following drugs: Marijuana.   Family History family history includes Diabetes in her father; Healthy in her brother, daughter, half-brother, mother, and  sister; Heart disease in her father; Hypertension in her father; Ovarian cancer in her maternal grandmother; Prostate cancer in her paternal grandfather.   PHYSICAL EXAM   Vitals:   04/25/24 0550 04/25/24 0600  BP: 118/65   Pulse: 67   Resp: 16   Temp: 98.4 F (36.9 C)   TempSrc: Oral   Weight:  136.1 kg  Height:  5' 4 (1.626 m)    Constitutional: No distress, well appearing, and well nourished. Neurologic: She is alert and is conversational.  Psychiatric: She has a normal mood and affect.  Musculoskeletal: Normal gait, grossly normal range of motion Cardiovascular: Normal rate.   Pulmonary/Chest: Normal work of breathing.  Gastrointestinal/ Abdominal: Soft. Gravid. There is no tenderness.  Skin: Skin is warm and dry. No rash noted.  Genitourinary: Normal external female genitalia.   Clinical EFW: 3472 gm 7 lb 10 oz 86 %  Presentation: vertex confirmed by BSUS  SVE:  FT/40/-2, posterior   NST Interpretation Baseline: 135 bpm Variability: moderate Accelerations: present Decelerations: late Contractions: regular, every 2-6 minutes, mild to palpation PRENATAL LABS FROM OB RESULTS CONSOLE  ABO, Rh: A/Positive/-- (03/13 1054) Antibody: Negative (03/13 1054) Rubella: 3.03 (03/13 1054) Varicella:  has received vaccine RPR: Non Reactive (07/14 1330)  HBsAg: Negative (03/13 1054)  HC No results found for: HCVAB HIV: Non Reactive (03/13 1054)  GC: Neg 04/05/24 CT: Neg 04/05/24 1h GTT:  Lab Results  Component Value Date   GLUCOSE 124 (H) 04/03/2024   3h GTT:  93/191/103/63 GBS: Positive/-- (09/02 1030)  Aneuploidy screening (NIPT): WNL x3; female  Anatomy Scan: 21+6 wks normal anatomy, 3vc, posterior placenta  Growth scans: 27+6 wks EFW 91%tile, AC 94%tile 32+6 wks EFW 93%tile, AC 95%tile 37+1 wks EFW 86%tile, AC 97%tile  ENCOUNTER LABS  CBC    Component Value Date/Time   WBC 9.1 04/03/2024 2232   RBC 4.05 04/03/2024 2232   HGB 10.8 (L) 04/03/2024 2232   HGB  12.2 10/15/2023 1054   HCT 36.5 04/18/2024 0856   PLT 236 04/03/2024 2232   PLT 286 10/15/2023 1054   MCV 82.0 04/03/2024 2232   MCV 85 10/15/2023 1054   MCV 83 11/12/2014 1940   MCH 26.7 04/03/2024 2232   MCHC 32.5 04/03/2024 2232   RDW 13.6 04/03/2024 2232   RDW 13.1 10/15/2023 1054   RDW 13.4 11/12/2014 1940   LYMPHSABS 2.0 04/03/2024 2232   LYMPHSABS 2.5 10/15/2023 1054   LYMPHSABS 2.9 11/12/2014 1940   MONOABS 0.7 04/03/2024 2232   MONOABS 0.8 11/12/2014 1940   EOSABS 0.1 04/03/2024 2232   EOSABS 0.1 10/15/2023 1054   EOSABS 0.0 11/12/2014 1940   BASOSABS 0.0 04/03/2024 2232   BASOSABS 0.0 10/15/2023 1054   BASOSABS 0.1 11/12/2014 1940      ASSESSMENT AND PLAN   Emunah Sherrelle Carbone is a 38 y.o. G3P1011 at  [redacted]w[redacted]d with EDD: 04/26/2024, by Last Menstrual Period admitted for induction of labor due to obesity, pre-gravid BMI 46.   Labor Plan:  Will start cervical ripening with misoprostol  with category I strip given late decels noted on arrival. IVF bolus infusing & position changed.  Fetal Status: - cephalic presentation by bedside ultrasound - EFW: 3472 gm 7 lb 10 oz 86 %  - CEFM - FHR currently category 2  GBS: positive No penicillin  allergy, treat with penicillin   Pain management: - plans labor support without medications, aware of options  Anemia - started on oral iron   Obesity in pregnancy - NIPT low risk - followed by MFM   Labs/Immunizations: TDAP: Given prenatally. Flu: Not given RSV: No Rubella: Immune Varicella: Unknown  Postpartum Plan: - Feeding: Breast Milk - Contraception: plans undecided, considering BTL vs IUD - Prenatal Care Provider: [facility]

## 2024-04-25 NOTE — Progress Notes (Signed)
 LABOR NOTE   Melanie [name] 38 y.o.GP@ at [redacted]w[redacted]d  SUBJECTIVE:  Pt very uncomfortable. C/o loss of fluid  Analgesia: Labor support without medications  OBJECTIVE:  BP (!) 130/55 (BP Location: Left Arm)   Pulse 94   Temp 97.7 F (36.5 C) (Oral)   Resp 18   Ht 5' 4 (1.626 m)   Wt 136.1 kg   LMP 07/21/2023 (Exact Date)   BMI 51.49 kg/m  No intake/output data recorded.  She has shown cervical change. CERVIX: 4 cm:  100%:   -2:   posterior:   firm SVE:   Dilation: 8.5 Effacement (%): 100 Station: -1 Exam by:: Dove MD CONTRACTIONS: regular, every 1-3 minutes FHR: Fetal heart tracing reviewed. Baseline: 130 bpm, Variability: Good {> 6 bpm), Accelerations: Non-reactive but appropriate for gestational age, and Decelerations: early and variable Category II    Labs: Lab Results  Component Value Date   WBC 11.9 (H) 04/25/2024   HGB 11.6 (L) 04/25/2024   HCT 35.3 (L) 04/25/2024   MCV 80.8 04/25/2024   PLT 268 04/25/2024    ASSESSMENT: 1) Labor curve reviewed.       Progress: Early latent labor.     Membranes: ruptured, clear fluid           Active Problems:   Positive GBS test   Iron  deficiency anemia   Obesity affecting pregnancy   PLAN: SROM clear fluid, pt very uncomfortable, she is sitting up on side of bed. Declines pain medication.  Pt encouraged to get into bed and turn to her side . She is slow to move due to her pain level. Discussed fetal heart tracing and concerns , she slowly gets into bed for repositioning.   Zelda Hummer, CNM  04/25/2024 4:46 PM

## 2024-04-25 NOTE — Anesthesia Preprocedure Evaluation (Addendum)
 Anesthesia Evaluation  Patient identified by MRN, date of birth, ID band Patient awake    Reviewed: Allergy & Precautions, H&P , NPO status , Patient's Chart, lab work & pertinent test results, reviewed documented beta blocker date and time   Airway Mallampati: II  TM Distance: >3 FB Neck ROM: full    Dental no notable dental hx. (+) Teeth Intact   Pulmonary pneumonia, resolved, former smoker   Pulmonary exam normal breath sounds clear to auscultation       Cardiovascular Exercise Tolerance: Good hypertension, On Medications  Rhythm:regular Rate:Normal     Neuro/Psych negative neurological ROS  negative psych ROS   GI/Hepatic Neg liver ROS,GERD  ,,  Endo/Other  negative endocrine ROSdiabetes, Gestational, Oral Hypoglycemic Agents    Renal/GU      Musculoskeletal   Abdominal   Peds  Hematology  (+) Blood dyscrasia, anemia   Anesthesia Other Findings   Reproductive/Obstetrics (+) Pregnancy                              Anesthesia Physical Anesthesia Plan  ASA: 2  Anesthesia Plan: Epidural   Post-op Pain Management:    Induction:   PONV Risk Score and Plan:   Airway Management Planned:   Additional Equipment:   Intra-op Plan:   Post-operative Plan:   Informed Consent: I have reviewed the patients History and Physical, chart, labs and discussed the procedure including the risks, benefits and alternatives for the proposed anesthesia with the patient or authorized representative who has indicated his/her understanding and acceptance.       Plan Discussed with: CRNA  Anesthesia Plan Comments:          Anesthesia Quick Evaluation

## 2024-04-25 NOTE — Progress Notes (Addendum)
 This G2P1 at 39.6 weeks received cytotec  for IOL for morbid obesity. She has had recurrent late decelerations not responsive to doses of terbutaline  and amnioinfusion. I have rec'd PLTCS and she understands and agrees. Consent was signed. I checked her cervix. It was 9 cm/99/-2 station with a marked amount of caput.

## 2024-04-25 NOTE — Anesthesia Procedure Notes (Signed)
 Epidural Patient location during procedure: OB  Staffing Resident/CRNA: Leontine Katz, CRNA Performed: resident/CRNA   Preanesthetic Checklist Completed: patient identified, IV checked, site marked, risks and benefits discussed, surgical consent, monitors and equipment checked, pre-op evaluation and timeout performed  Epidural Patient position: sitting Prep: ChloraPrep and site prepped and draped Patient monitoring: heart rate, continuous pulse ox and blood pressure Approach: midline Location: L4-L5 Injection technique: LOR saline  Needle:  Needle type: Tuohy  Needle gauge: 17 G Needle length: 9 cm and 9 Needle insertion depth: 9 cm Catheter type: closed end flexible Catheter size: 19 Gauge Catheter at skin depth: 15 cm Test dose: negative and 1.5% lidocaine  with Epi 1:200 K  Assessment Events: blood not aspirated, no cerebrospinal fluid, injection not painful, no injection resistance, no paresthesia and negative IV test  Additional Notes   Patient tolerated the insertion well without complications.Reason for block:procedure for pain

## 2024-04-25 NOTE — Transfer of Care (Signed)
 Immediate Anesthesia Transfer of Care Note  Patient: Melanie [name]  Procedure(s) Performed: CESAREAN DELIVERY  Patient Location: Mother/Baby  Anesthesia Type:Epidural  Level of Consciousness: awake and oriented  Airway & Oxygen  Therapy: Patient Spontanous Breathing  Post-op Assessment: Report given to RN and Post -op Vital signs reviewed and stable  Post vital signs: Reviewed  Last Vitals:  Vitals Value Taken Time  BP 114/77 04/25/24 19:32  Temp 36.2 C 04/25/24 19:32  Pulse 108 04/25/24 19:32  Resp 16 04/25/24 19:32  SpO2 99 % 04/25/24 19:32    Last Pain:  Vitals:   04/25/24 1819  TempSrc: Oral  PainSc:       Patients Stated Pain Goal: 0 (04/25/24 0705)  Complications: No notable events documented.

## 2024-04-25 NOTE — OB Triage Note (Signed)
 LABOR & DELIVERY OB TRIAGE NOTE  SUBJECTIVE  HPI Melanie [name] is a 38 y.o. G3P1011 at [redacted]w[redacted]d who presents to Labor & Delivery for pelvic pain and pressure.  OB History     Gravida  3   Para  1   Term  1   Preterm  0   AB  1   Living  1      SAB  0   IAB  1   Ectopic  0   Multiple  0   Live Births  1        Obstetric Comments  Being see @ [place] due to High Risk pregnancy - weight related. Melanie [name]          OBJECTIVE  LMP 07/21/2023 (Exact Date)   General: alert and oriented Cervical exam: Dilation: Fingertip Effacement (%): Thick Exam by:: CNM studemt   NST I reviewed the NST and it was reactive.  Baseline: 140 Variability: moderate Accelerations: 15x15 present Decelerations:none Toco: none Category 1  ASSESSMENT Impression  1) Pregnancy at G3P1011, [redacted]w[redacted]d, Estimated Date of Delivery: 04/26/24 2) Reassuring maternal/fetal status 3) Pelvic pain in pregnancy, unchanged x1 week  PLAN Melanie [name] will be discharged home in stable condition. Reports that she has Flexeril  at home. Encouraged to take Tylenol , warm bath/shower, wear abdominal binder if comfortable.    Melanie [name], CNM  04/25/24  4:29 PM

## 2024-04-25 NOTE — Op Note (Signed)
 04/25/2024  7:23 PM  PATIENT:  Melanie [name]  37 y.o. female  PRE-OPERATIVE DIAGNOSIS:  non reassuring fetal heart rate, morbid obesity  POST-OPERATIVE DIAGNOSIS:  same  PROCEDURE:  Procedure(s): CESAREAN DELIVERY (N/A)  FINDINGS: living female infant, Weight 8-13, Apgars 8 & 9, normal pelvic anatomy, intact placenta with 3 vessel cord  SURGEON:  Surgeons and Role:    * Starla, Harland BROCKS, MD - Primary  ASSISTANT: Zelda Hummer, CNM  ANESTHESIA:   local and epidural  EBL:  730 mL  BLOOD ADMINISTERED:none  DRAINS: Urinary Catheter (Foley)   LOCAL MEDICATIONS USED:  MARCAINE      SPECIMEN:  No Specimen  COUNTS:  YES  TOURNIQUET:  * No tourniquets in log *  DICTATION: .Note written in EPIC  PLAN OF CARE: Admit to inpatient   PATIENT DISPOSITION:  PACU - hemodynamically stable.    The risks, benefits, and alternatives of surgery were explained, understood, accepted. Consents were signed. All questions were answered. Her epidural was bolused for surgery. In the OR her abdomen and vagina were prepped and draped in the usual sterile fashion. A Foley catheter had been placed, draining clear urine throughout case. Timeout procedure was done. After adequate anesthesia was assured, 30 mL of 0.5% Marcaine  was injected into the subcutaneous tissue about 2 cm above the symphysis pubis. An incision was made there. The incision was carried down through the subcutaneous tissue to the fascia. The fascia was scored the midline and extended bilaterally. The rectus fascia was elevated with Kocher clamps and separated from the rectus muscles superiorly and inferiorly. The peritoneum was entered with hemostats. Peritoneal incision was extended bilaterally bluntly.  The bladder blade was placed. A transverse incision was made on the well-developed lower uterine segment. The uterine incision was extended with traction on each side.  Clear fluid was noted with amniotomy. The baby was delivered  from a vertex lie with a OA presentation. The baby had marked caput and molding but was still not engaged in the pelvis. The baby's cord was clamped and cut, and he was transferred to the NICU personnel for routine care. The placenta was delivered intact with traction. The uterus was left in situ and the interior was cleaned with a dry lap sponge. The uterine incision was closed with 2-0 Vicryl running locking suture in two layers, the second layer imbricating the first. Excellent hemostasis was noted. The adnexa were normal. The rectus fascia rectus muscles were noted be hemostatic as well. The fascia was closed with a 0 Vicryl suture in a running nonlocking fashion. No defects were palpable. The subcutaneous tissue was irrigated, clean, and dried. I closed Camper's fascia with a running 3-0 vicryl suture.  A subcuticular closure was done with a 3-0 Vicryl suture. Excellent cosmetic results were obtained. A large Provena wound vacuum was placed and was noted to function properly.  She was taken to the recovery room in stable condition. She tolerated the procedure well.

## 2024-04-25 NOTE — Progress Notes (Signed)
 LABOR NOTE   Melanie [name] 38 y.o.GP@ at [redacted]w[redacted]d  SUBJECTIVE:  Comfortable with epidural  Analgesia: Epidural  OBJECTIVE:  BP (!) 116/50   Pulse (!) 118   Temp 97.7 F (36.5 C) (Oral)   Resp 18   Ht 5' 4 (1.626 m)   Wt 136.1 kg   LMP 07/21/2023 (Exact Date)   SpO2 100%   BMI 51.49 kg/m  No intake/output data recorded.  She has shown cervical change. CERVIX: 9 cm:  90%:   -2:   mid position:   soft caput SVE:   Dilation: 8.5 Effacement (%): 100 Station: -1 Exam by:: Dove MD CONTRACTIONS: regular, every 2-6 minutes FHR: Fetal heart tracing reviewed. Baseline: 170 bpm, Variability: Good {> 6 bpm), Accelerations: Non-reactive but appropriate for gestational age, and Decelerations: late decels down 90s for 60-90 sec, occasional prolonged.   Category II    Labs: Lab Results  Component Value Date   WBC 11.9 (H) 04/25/2024   HGB 11.6 (L) 04/25/2024   HCT 35.3 (L) 04/25/2024   MCV 80.8 04/25/2024   PLT 268 04/25/2024    ASSESSMENT: 1) Labor curve reviewed.       Progress: Active phase labor.     Membranes: ruptured, clear fluid     IUPC came out with epidural placement. Dr. Starla replaced , amnioinfusion.        Active Problems:   Positive GBS test   Iron  deficiency anemia   Obesity affecting pregnancy   PLAN: plan Cesarean delivery. Dr. Starla at the bedside recommend cesarean delivery. Reviewed consent. Pt and her partner are in agreement. Consent signed. Anesthesia notified.   Zelda Hummer, CNM . 04/25/2024 6:16 PM

## 2024-04-25 NOTE — Progress Notes (Signed)
 LABOR NOTE   Melanie [name] Comes 38 y.o.GP@ at [redacted]w[redacted]d  SUBJECTIVE:  Pt very uncomfortable she is bearing down with contractions.  Analgesia: Labor support without medications  OBJECTIVE:  BP (!) 130/55 (BP Location: Left Arm)   Pulse 94   Temp 97.7 F (36.5 C) (Oral)   Resp 18   Ht 5' 4 (1.626 m)   Wt 136.1 kg   LMP 07/21/2023 (Exact Date)   BMI 51.49 kg/m  No intake/output data recorded.  She has not shown cervical change. CERVIX: 9cm:  cervix is swelling do to bearing down.  SVE:   Dilation: 8.5 Effacement (%): 100 Station: -1 Exam by:: Dove MD CONTRACTIONS: regular, every 2-3 minutes FHR: Fetal heart tracing reviewed. Baseline: 135 bpm, Variability: Good {> 6 bpm), Accelerations: Non-reactive but appropriate for gestational age, and Decelerations: variables Category II . Terbualine dose given     Labs: Lab Results  Component Value Date   WBC 11.9 (H) 04/25/2024   HGB 11.6 (L) 04/25/2024   HCT 35.3 (L) 04/25/2024   MCV 80.8 04/25/2024   PLT 268 04/25/2024    ASSESSMENT: 1) Labor curve reviewed.       Progress: Active phase labor.     Membranes: ruptured, clear fluid     IUPC and FSE in placed       Active Problems:   Positive GBS test   Iron  deficiency anemia   Obesity affecting pregnancy   PLAN: Pt has been instructed to labor down however, she is having difficulty with doing this as she has no epidural . Dr. Starla at the bedside, evaluate pt. She had been declined epidural previously. Dr. Starla discussed that cervix is swelling due to her bearing down. Discussed that epidural may help her relax and allow her to rest and cervix to become complete. Pt is agreeing to epidural placement.    Zelda Hummer, CNM  04/25/2024 4:35 PM

## 2024-04-26 ENCOUNTER — Encounter: Payer: Self-pay | Admitting: Obstetrics & Gynecology

## 2024-04-26 LAB — CBC
HCT: 30.8 % — ABNORMAL LOW (ref 36.0–46.0)
Hemoglobin: 9.9 g/dL — ABNORMAL LOW (ref 12.0–15.0)
MCH: 26.6 pg (ref 26.0–34.0)
MCHC: 32.1 g/dL (ref 30.0–36.0)
MCV: 82.8 fL (ref 80.0–100.0)
Platelets: 255 K/uL (ref 150–400)
RBC: 3.72 MIL/uL — ABNORMAL LOW (ref 3.87–5.11)
RDW: 13.8 % (ref 11.5–15.5)
WBC: 20 K/uL — ABNORMAL HIGH (ref 4.0–10.5)
nRBC: 0 % (ref 0.0–0.2)

## 2024-04-26 LAB — CREATININE, SERUM
Creatinine, Ser: 0.89 mg/dL (ref 0.44–1.00)
GFR, Estimated: >60 mL/min (ref >60)

## 2024-04-26 NOTE — Anesthesia Postprocedure Evaluation (Signed)
 Anesthesia Post Note  Patient: Personnel officer  Procedure(s) Performed: CESAREAN DELIVERY  Patient location during evaluation: Mother Baby Anesthesia Type: Epidural Level of consciousness: awake Respiratory status: spontaneous breathing Cardiovascular status: stable Postop Assessment: no headache Anesthetic complications: no   No notable events documented.   Last Vitals:  Vitals:   04/26/24 0300 04/26/24 0319  BP:  128/64  Pulse: 76 69  Resp:  18  Temp:  36.9 C  SpO2: 98% 99%    Last Pain:  Vitals:   04/26/24 0634  TempSrc:   PainSc: 5                  Shona Earnie Fare

## 2024-04-26 NOTE — Discharge Summary (Signed)
 Postpartum Discharge Summary  Date of Service updated***     Patient Name: Melanie [name] DOB: June 17, 1986 MRN: [id number]  Date of admission: 04/25/2024 Delivery date:04/25/2024 Delivering provider: STARLA HARLAND BROCKS Date of discharge: 04/26/2024  Admitting diagnosis: Obesity affecting pregnancy [O99.210] Intrauterine pregnancy: [redacted]w[redacted]d     Secondary diagnosis:  Active Problems:   Positive GBS test   Iron  deficiency anemia   Obesity affecting pregnancy  Additional problems: none    Discharge diagnosis: Term Pregnancy Delivered                                              Post partum procedures:{Postpartum procedures:23558} Augmentation: Cytotec  Complications: None  Hospital course: Induction of Labor With Cesarean Section   38 y.o. yo [id number] at [redacted]w[redacted]d was admitted to the hospital 04/25/2024 for induction of labor. Patient had a labor course significant for Non reassuring fetal heart tones. The patient went for cesarean section due to Non-Reassuring FHR. Delivery details are as follows: Membrane Rupture Time/Date: 11:25 AM,04/25/2024  Delivery Method:C-Section, Low Transverse Operative Delivery:N/A Details of operation can be found in separate operative Note.  Patient had a postpartum course complicated by***. She is ambulating, tolerating a regular diet, passing flatus, and urinating well.  Patient is discharged home in stable condition on 04/26/24.      Newborn Data: Birth date:04/25/2024 Birth time:6:47 PM Gender:Female Living status:Living Apgars:8 ,9  Weight:4010 g                               Magnesium Sulfate received: No BMZ received: No Rhophylac:N/A MMR:No T-DaP:Given prenatally Flu: {Qol:76036} RSV Vaccine received: No Transfusion:No Immunizations administered: Immunization History  Administered Date(s) Administered   Tdap 02/15/2024    Physical exam  Vitals:   04/26/24 0011 04/26/24 0100 04/26/24 0300 04/26/24 0319  BP:    128/64  Pulse: 80 79  76 69  Resp:    18  Temp:    98.4 F (36.9 C)  TempSrc:    Oral  SpO2: 99% 99% 98% 99%  Weight:      Height:       General: {Exam; general:21111117} Lochia: {Desc; appropriate/inappropriate:30686::appropriate} Uterine Fundus: {Desc; firm/soft:30687} Incision: {Exam; incision:21111123} DVT Evaluation: {Exam; dvt:2111122} Labs: Lab Results  Component Value Date   WBC 11.9 (H) 04/25/2024   HGB 11.6 (L) 04/25/2024   HCT 35.3 (L) 04/25/2024   MCV 80.8 04/25/2024   PLT 268 04/25/2024      Latest Ref Rng & Units 04/25/2024    6:12 AM  CMP  Creatinine 0.44 - 1.00 mg/dL 9.10    Edinburgh Score:    04/05/2024   10:21 AM  Edinburgh Postnatal Depression Scale Screening Tool  I have been able to laugh and see the funny side of things. 0  I have looked forward with enjoyment to things. 1  I have blamed myself unnecessarily when things went wrong. 0  I have been anxious or worried for no good reason. 0  I have felt scared or panicky for no good reason. 0  Things have been getting on top of me. 2  I have been so unhappy that I have had difficulty sleeping. 3  I have felt sad or miserable. 3  I have been so unhappy that I have been crying. 2  The thought of  harming myself has occurred to me. 0  Edinburgh Postnatal Depression Scale Total 11      After visit meds:  Allergies as of 04/26/2024   No Known Allergies   Med Rec must be completed prior to using this [facility]***        Discharge home in stable condition Infant Feeding: {Baby feeding:23562} Infant Disposition:{CHL IP OB HOME WITH FNUYZM:76418} Discharge instruction: per After Visit Summary and Postpartum booklet. Activity: Advance as tolerated. Pelvic rest for 6 weeks.  Diet: {OB diet:21111121} Anticipated Birth Control: {Birth Control:23956} Postpartum Appointment:{Outpatient follow up:23559} Additional Postpartum F/U: {PP Procedure:23957} Future Appointments:No future appointments. Follow up Visit:       04/26/2024 Zelda Hummer, CNM

## 2024-04-26 NOTE — Progress Notes (Signed)
 Subjective:   Melanie [name] had a pC/D on 04/25/2024. Her labor was complicated by NRFHTs. Has had routine postpartum care.  Eating, hydrating, and voiding regularly without difficulty. Since delivery has not had BM. She is breast feeding. Reports mild/moderate vaginal bleeding, denies passing large blood clots. Has had cramping abdomen pain relieved with tylenol /ibuprofen . Is unsure about contraception. Denies anxiety/depression symptoms. Endorses good support from partner and family.   Objective:  Vital signs in last 24 hours: Temp:  [97.2 F (36.2 C)-98.9 F (37.2 C)] 98.1 F (36.7 C) (09/23 0750) Pulse Rate:  [69-118] 76 (09/23 0750) Resp:  [14-26] 20 (09/23 0750) BP: (99-134)/(46-98) 126/74 (09/23 0750) SpO2:  [96 %-100 %] 98 % (09/23 0750)    General: NAD Pulmonary: no increased work of breathing Breasts: soft, non-tender, nipples without breakdown Abdomen: soft, non-tender Fundus: firm, midline, at umbilicus Lochia: light rubra, no clots Perineum: no erythema or foul odor discharge, minimal edema, laceration well approximated  Extremities: no edema, no erythema, no tenderness  Results for orders placed or performed during the hospital encounter of 04/25/24 (from the past 72 hours)  CBC     Status: Abnormal   Collection Time: 04/25/24  6:01 AM  Result Value Ref Range   WBC 11.9 (H) 4.0 - 10.5 K/uL   RBC 4.37 3.87 - 5.11 MIL/uL   Hemoglobin 11.6 (L) 12.0 - 15.0 g/dL   HCT 64.6 (L) 63.9 - 53.9 %   MCV 80.8 80.0 - 100.0 fL   MCH 26.5 26.0 - 34.0 pg   MCHC 32.9 30.0 - 36.0 g/dL   RDW 86.1 88.4 - 84.4 %   Platelets 268 150 - 400 K/uL   nRBC 0.0 0.0 - 0.2 %    Comment: Performed at [facility], [address] [place], [place] [address]  RPR     Status: None   Collection Time: 04/25/24  6:01 AM  Result Value Ref Range   RPR Ser Ql NON REACTIVE NON REACTIVE    Comment: Performed at [facility], [address], [place], [place] [address]   Creatinine, serum     Status: None   Collection Time: 04/25/24  6:12 AM  Result Value Ref Range   Creatinine, Ser 0.89 0.44 - 1.00 mg/dL   GFR, Estimated >39 >39 mL/min    Comment: (NOTE) Calculated using the CKD-EPI Creatinine Equation (2021) Performed at [facility], [address] [place], [place] [address]   CBC     Status: Abnormal   Collection Time: 04/26/24  5:27 AM  Result Value Ref Range   WBC 20.0 (H) 4.0 - 10.5 K/uL   RBC 3.72 (L) 3.87 - 5.11 MIL/uL   Hemoglobin 9.9 (L) 12.0 - 15.0 g/dL   HCT 69.1 (L) 63.9 - 53.9 %   MCV 82.8 80.0 - 100.0 fL   MCH 26.6 26.0 - 34.0 pg   MCHC 32.1 30.0 - 36.0 g/dL   RDW 86.1 88.4 - 84.4 %   Platelets 255 150 - 400 K/uL   nRBC 0.0 0.0 - 0.2 %    Comment: Performed at [facility], [address] [place], [place] [address]     Assessment:   38 y.o. [id number] postpartum day 1 s/p low cervical transverse Cesarean section on 04/25/2024 Breast Anemia secondary to acute blood loss, is significant for admission- dizziness/lightheadedness Vital signs Reviewed and stable Pain well controlled  Plan:    PO Fe Blood Type A/Positive/-- (03/13 1054) / Rubella 3.03 (  03/13 1054) / Varicella not screened Rhogam: not indicated Tdap: Given prenatally Varicella:unknown Rubella: not indicated Flu: offer PP Feeding plan breast, lactation support Encouraged to continue breastfeeding, BF education on latch, position changes, cluster feeding, hunger cues, lactogenesis II, milk supply Education given regarding options for contraception, as well as compatibility with breast feeding if applicable.  Unsure about contraception Continued routine postpartum care  Counseled on normal uterine involution and vaginal bleeding postpartum Reviewed Lovenox  for prevention of blood clots. Discussed her risk factors such as post surgical and BMI. Patient considering. Encourage ambulation.  Anticipate discharge home tomorrow    [name], CNM [facility] 04/26/2024, 10:51 AM

## 2024-04-26 NOTE — Lactation Note (Signed)
 This note was copied from a baby's chart. Lactation Consultation Note  Patient Name: Melanie [name] Unijb'd Date: 04/26/2024 Age:36 hours Reason for consult: Follow-up assessment;1st time breastfeeding;Term;Breastfeeding assistance;Mother's request;RN request;Difficult latch   Maternal Data This is mom's 2nd baby, primary C/S for fetal distress. Mom with AMA and history of morbid obesity and anemia.  Requested to assist mom with breastfeeding as baby who was latching was now not latching. Baby had begun using a pacifier today.  Has patient been taught Hand Expression?: Yes Does the patient have breastfeeding experience prior to this delivery?: No  Feeding Mother's Current Feeding Choice: Breast Milk Assisted mom with positioning and latching as discussed in previous follow-up note. Baby refusing to latch directly to mom's nipple. Baby would suck on pacifier for calming but when mom's nipple was introduced he would not latch despite several attempts. Mom reports feeling discouraged and she is having pain issues related to her C/S. Mom open to trying a nipple shield to achieve latch. Baby did latch with use of a #24 mm nipple shield and fed for 10 minutes. Care nurse in attendance for part of the feeding to see baby feeding with shield. Baby with audible swallows noted by mom, care nurse, and LC. Colostrum noted in shield. After feeding at the left breast with the shield baby was burped and with that burp baby began choking on clear secretion mixed with some colostrum he was spitting up. LC used bulb syringe to assist with clearing secretion. LC opened patient's room door and called for care nurse to the room as baby continued to have more emesis and his lips had a slight blue tinge. Care nurse immediately to bedside attended to baby. Per care nurse baby's heart rate, respiratory rate, and temp was all within normal limits. Baby's lips quickly returned to pink color once he was no longer was  spitting up. LATCH Score Latch: Repeated attempts needed to sustain latch, nipple held in mouth throughout feeding, stimulation needed to elicit sucking reflex. (improved with use of nipple shield)  Audible Swallowing: A few with stimulation  Type of Nipple: Everted at rest and after stimulation  Comfort (Breast/Nipple): Soft / non-tender  Hold (Positioning): Assistance needed to correctly position infant at breast and maintain latch.  LATCH Score: 7   Lactation Tools Discussed/Used Tools: Nipple Shields Nipple shield size: 24  Interventions Interventions: Assisted with latch;Adjust position;Breast compression;Support pillows;Position options;Education (Use and cleaning of nipple shield) Recommended mom not use pacifier for now. If baby does not latch at the next feed with use of nipple shield discussed with nurse initiating DEBP and baby receiving feed via slow flow bottle with mom's expressed colostrum and/or supplemental milk as needed per mom's choice.  Discharge Pump: Advised to call insurance company (Mom would like a handpump for home use until she is able to get a breastpump from her insurance co. Per mom she had contacted her insurance and received an email requesting a script from her Provider. Mom will discuss with Provider tomorrow am.)  Consult Status Consult Status: Follow-up Date: 04/27/24 Follow-up type: In-patient  Update provided to care nurse.  Avelina DELENA Gaskins 04/26/2024, 10:24 PM

## 2024-04-26 NOTE — Lactation Note (Signed)
 This note was copied from a baby's chart. Lactation Consultation Note  Patient Name: Melanie [name] Date: 04/26/2024 Age:36 hours Reason for consult: Follow-up assessment;1st time breastfeeding;Term;Breastfeeding assistance   Maternal Data This is mom's 2nd baby, primary C/S for fetal distress. Mom with AMA and history of morbid obesity and anemia.  On initial visit mom reports she did not breastfeed her first baby. This baby is latching and breastfeeding and is having wet and stool diapers per mom. Mom reports some nipple tenderness. Nipples are intact. Recommended mom apply coconut oil to each nipple after feeding. Encouraged mom to call [facility] for next feeding for breastfeeding assistance for LC to check latch.  Has patient been taught Hand Expression?: Yes Does the patient have breastfeeding experience prior to this delivery?: No  Feeding Mother's Current Feeding Choice: Breast Milk  LATCH Score Latch: Grasps breast easily, tongue down, lips flanged, rhythmical sucking.  Audible Swallowing: Spontaneous and intermittent  Type of Nipple: Everted at rest and after stimulation  Comfort (Breast/Nipple): Soft / non-tender (Mom with normative nipple tenderness with no discomfort report using better positioning and blanket roll under mom's breast to support the breast.)  Hold (Positioning): Assistance needed to correctly position infant at breast and maintain latch.  LATCH Score: 9   Lactation Tools Discussed/Used  Harmony Manual breastpump use, milk storage for normal newborn, cleaning of parts.  Interventions Interventions: Breast feeding basics reviewed;Assisted with latch;Breast massage;Hand express;Breast compression;Adjust position;Support pillows;Position options;Education LC number on white board for mom to call for breastfeeding assistance.  Discharge Pump: Advised to call insurance company (Mom would like a handpump for home use until she is able to get a  breastpump from her insurance co. Per mom she had contacted her insurance and received an email requesting a script from her Provider. Mom will discuss with Provider tomorrow am.)  Consult Status Consult Status: Follow-up Date: 04/27/24 Follow-up type: In-patient  Update provided to care nurse.  Melanie [name] 04/26/2024, 5:03 PM

## 2024-04-26 NOTE — Lactation Note (Signed)
 This note was copied from a baby's chart. Lactation Consultation Note  Patient Name: Melanie [name] Date: 04/26/2024 Age:36 hours Reason for consult: Follow-up assessment;1st time breastfeeding;Term;Breastfeeding assistance   Maternal Data This is mom's 2nd baby, primary C/S for fetal distress. Mom with AMA and history of morbid obesity and anemia.   On follow-up visit assisted mom with breastfeeding.  Has patient been taught Hand Expression?: Yes Does the patient have breastfeeding experience prior to this delivery?: No  Feeding Mother's Current Feeding Choice: Breast Milk Provided mom with tips and strategies to maximize position and latch techniques. Provided a baby blanket rolled under mom's breast to support her breast. Mom with large breasts. Per mom it was easier and more comfortable to latch baby with blanket roll. Recommended mom support her breast using the c-hold to assist baby achieve a deeper latch. Baby latched well with lips flanged outward on to areola, audible swallows heard. Mom was comfortable when baby latched and throughout the feeding. LATCH Score Latch: Grasps breast easily, tongue down, lips flanged, rhythmical sucking.  Audible Swallowing: Spontaneous and intermittent  Type of Nipple: Everted at rest and after stimulation  Comfort (Breast/Nipple): Soft / non-tender (Mom with normative nipple tenderness with no discomfort report using better positioning and blanket roll under mom's breast to support the breast.)  Hold (Positioning): Assistance needed to correctly position infant at breast and maintain latch.  LATCH Score: 9   Interventions Interventions: Breast feeding basics reviewed;Assisted with latch;Breast massage;Hand express;Breast compression;Adjust position;Support pillows;Position options;Education  Discharge Pump: Advised to call insurance company (Mom would like a handpump for home use until she is able to get a breastpump from  her insurance co. Per mom she had contacted her insurance and received an email requesting a script from her Provider. Mom will discuss with Provider tomorrow am.)  Consult Status Consult Status: Follow-up Date: 04/27/24 Follow-up type: In-patient  Update provided to care nurse.  Melanie [name] 04/26/2024, 5:12 PM

## 2024-04-27 ENCOUNTER — Ambulatory Visit

## 2024-04-27 MED ORDER — FERROUS SULFATE 325 (65 FE) MG PO TABS
325.0000 mg | ORAL_TABLET | Freq: Two times a day (BID) | ORAL | Status: DC
  Administered 2024-04-27 – 2024-04-28 (×2): 325 mg via ORAL
  Filled 2024-04-27 (×2): qty 1

## 2024-04-27 MED ORDER — LIDOCAINE 5 % EX PTCH
1.0000 | MEDICATED_PATCH | CUTANEOUS | Status: DC
  Administered 2024-04-27 – 2024-04-28 (×2): 1 via TRANSDERMAL
  Filled 2024-04-27 (×2): qty 1

## 2024-04-27 MED ORDER — GABAPENTIN 300 MG PO CAPS
300.0000 mg | ORAL_CAPSULE | Freq: Two times a day (BID) | ORAL | Status: DC
  Administered 2024-04-27 – 2024-04-28 (×2): 300 mg via ORAL
  Filled 2024-04-27 (×2): qty 1

## 2024-04-27 MED ORDER — KETOROLAC TROMETHAMINE 30 MG/ML IJ SOLN
30.0000 mg | Freq: Four times a day (QID) | INTRAMUSCULAR | Status: DC | PRN
  Administered 2024-04-27 – 2024-04-28 (×2): 30 mg via INTRAVENOUS
  Filled 2024-04-27 (×2): qty 1

## 2024-04-27 NOTE — Progress Notes (Signed)
 Post Partum Day 1 Subjective: Pt reports a 10/10 pain at her incision. The pain makes it difficult for her to move and walk to the bathroom. A lidocaine  patch was placed earlier, which gave her some relief for a few hours. She is currently nursing her infant, denies any cramping with nursing.   Objective: Blood pressure (!) 154/78, pulse 70, temperature 98.2 F (36.8 C), temperature source Oral, resp. rate 18, height 5' 4 (1.626 m), weight 136.1 kg, last menstrual period 07/21/2023, SpO2 100%, unknown if currently breastfeeding.  Physical Exam:  General: alert, cooperative, and sleepy but arousable Lungs: breathing without difficulty  Lochia: appropriate Uterine Fundus: firm Abdomen: soft  Incision: Prevena dry and intact, lidocaine  patches present,   Recent Labs    04/25/24 0601 04/26/24 0527  HGB 11.6* 9.9*  HCT 35.3* 30.8*    Assessment/Plan: Reviewed pt's complaints and assessment with Dr Suzanna -Toradol  ordered, Motrin  discontinued  -Gabapentin  ordered  -CBC and CMP ordered for AM draw    LOS: 2 days   JINNIE HERO [facility], CNM 04/27/2024, 5:31 PM

## 2024-04-27 NOTE — Progress Notes (Signed)
 Post Partum Day 1 Subjective: Melanie [name] is resting in bed sleeping, she complains of 10/10 sharp pain at her incision site. We discussed adding a lidocaine  patch to help decrease the sharp pain locally. We also discussed taking her pain meds as scheduled even if she is feeling better, to try and stay ahead of the pain. She is breastfeeding well. She has been up ad lib, eating and passing flatus. Her partner is at bedside and supportive.   Objective: Blood pressure 113/67, pulse 64, temperature 98.7 F (37.1 C), temperature source Oral, resp. rate 20, height 5' 4 (1.626 m), weight 136.1 kg, last menstrual period 07/21/2023, SpO2 100%, unknown if currently breastfeeding.  Physical Exam:  General: alert and cooperative Lochia: appropriate Uterine Fundus: firm Incision: healing well, no significant drainage, no dehiscence, no significant erythema DVT Evaluation: No evidence of DVT seen on physical exam.  Recent Labs    04/25/24 0601 04/26/24 0527  HGB 11.6* 9.9*  HCT 35.3* 30.8*    Assessment/Plan: Breastfeeding,LC as needed.  Pending pain management consider discharge tomorrow or the following day.  HGB: Now 9.9, oral iron  imitated.   LOS: 2 days   JINNIE HERO [facility], CNM 04/27/2024, 1:30 PM

## 2024-04-27 NOTE — Anesthesia Postprocedure Evaluation (Signed)
 Anesthesia Post Note  Patient: Melanie [name]  Procedure(s) Performed: CESAREAN DELIVERY  Anesthesia Type: Epidural Anesthetic complications: no   No notable events documented.   Last Vitals:  Vitals:   04/26/24 2315 04/27/24 0845  BP: 125/70 113/67  Pulse: 80 64  Resp: 18 20  Temp: 36.8 C 37.1 C  SpO2: 100% 100%    Last Pain:  Vitals:   04/27/24 0959  TempSrc:   PainSc: 10-Worst pain ever                 Lynwood KANDICE Clause

## 2024-04-27 NOTE — Lactation Note (Signed)
 This note was copied from a baby's chart. Lactation Consultation Note  Patient Name: Melanie [name] Unijb'd Date: 04/27/2024 Age:36 hours Reason for consult: Follow-up assessment;1st time breastfeeding;Term   Maternal Data Follow up assessment w/a 39hr old baby Melanie Donnice and parents. MOB in lots of pain at the time of the visit.  Stated that breastfeeding is going well overall.   Feeding Mother's Current Feeding Choice: Breast Milk  Interventions Interventions: Education  LC introduced self encouraged mom to reach out when feeling better to get assistance w/ breastfeeding.  Family verbalized understanding.   Consult Status Consult Status: Follow-up Follow-up type: In-patient    Jaice Digioia S Desire Fulp 04/27/2024, 10:51 AM

## 2024-04-28 ENCOUNTER — Other Ambulatory Visit: Payer: Self-pay | Admitting: Certified Nurse Midwife

## 2024-04-28 MED ORDER — OXYCODONE HCL 5 MG PO TABS: 5.0000 mg | ORAL_TABLET | ORAL | 0 refills | Status: DC | PRN

## 2024-04-28 MED ORDER — GABAPENTIN 300 MG PO CAPS: 300.0000 mg | ORAL_CAPSULE | Freq: Two times a day (BID) | ORAL | 0 refills | Status: DC

## 2024-04-28 MED ORDER — IBUPROFEN 600 MG PO TABS
600.0000 mg | ORAL_TABLET | Freq: Four times a day (QID) | ORAL | Status: DC | PRN
  Administered 2024-04-28 (×2): 600 mg via ORAL
  Filled 2024-04-28 (×2): qty 1

## 2024-04-28 MED ORDER — KETOROLAC TROMETHAMINE 10 MG PO TABS: 10.0000 mg | ORAL_TABLET | Freq: Four times a day (QID) | ORAL | 0 refills | Status: DC | PRN

## 2024-04-28 NOTE — Progress Notes (Signed)
 AVS discharge instructions reviewed w/ pt. All questions answered. Pt to discharge home.

## 2024-04-28 NOTE — Telephone Encounter (Signed)
 New order placed.

## 2024-04-28 NOTE — Lactation Note (Signed)
 This note was copied from a baby's chart. Lactation Consultation Note  Patient Name: Melanie [name] Date: 04/28/2024 Age:36 hours Reason for consult: Follow-up assessment;1st time breastfeeding;Term;Other (Comment) (Discharge Education)   Maternal Data Follow up assessment and discharge education w/ infant and parents. Patient nursing infant and pumping upon entry into the room. Parents expressed that infants stool has changed to a yellow seedy color already.   Feeding Mother's Current Feeding Choice: Breast Milk and Formula  LC assessed infants latch on the breast.  Infants has a wide open gape with audible swallows heard.  Mom pumping the rt breast and was able to collect 19ml of colostrum during the feeding.  LATCH Score Latch: Grasps breast easily, tongue down, lips flanged, rhythmical sucking.  Audible Swallowing: Spontaneous and intermittent  Type of Nipple: Everted at rest and after stimulation  Comfort (Breast/Nipple): Soft / non-tender  Hold (Positioning): No assistance needed to correctly position infant at breast.  LATCH Score: 10  Lactation Tools Discussed/Used Tools: Pump Breast pump type: Double-Electric Breast Pump Pump Education: Setup, frequency, and cleaning;Milk Storage Pumped volume: 19 mL  Interventions Interventions: Breast feeding basics reviewed;DEBP;Education;CDC milk storage guidelines  Discharge Discharge Education: Engorgement and breast care;Warning signs for feeding baby;Outpatient recommendation Pump: Personal WIC Program: Yes  Education on engorgement prevention/treatment was discussed as well as breastmilk storage guidelines.  LC provided patient with a handout on breastmilk storage guidelines from [place]. [facility] outpatient lactation services phone number written on the white board in the room.  Patient verbalized understanding.  LC also provided education from the postpartum book about warning signs to look for in a poor  feeding.    Consult Status Consult Status: Complete Follow-up type: Call as needed    Taisei Bonnette S Bina Veenstra 04/28/2024, 1:29 PM

## 2024-05-04 ENCOUNTER — Ambulatory Visit (INDEPENDENT_AMBULATORY_CARE_PROVIDER_SITE_OTHER): Admitting: Obstetrics & Gynecology

## 2024-05-04 ENCOUNTER — Encounter: Payer: Self-pay | Admitting: Obstetrics & Gynecology

## 2024-05-04 VITALS — BP 119/73 | HR 60 | Ht 65.0 in | Wt 284.0 lb

## 2024-05-04 DIAGNOSIS — Z9889 Other specified postprocedural states: Secondary | ICD-10-CM

## 2024-05-04 NOTE — Progress Notes (Signed)
    GYNECOLOGY PROGRESS NOTE  Subjective:    Patient ID: Melanie [name], female    DOB: Aug 24, 1985, 39 y.o.   MRN: [id number]  HPI  Patient is a 38 y.o. H5E7977 a 1 week incision check. She had a failed IOL and then had an emergent PLTCS. She has a Provena wound vac. She denies any fevers, excessive pain. She reports normal bladder and bowel function. She is breast and bottle feeding. The baby is doing well. She denies depression symptoms.  The following portions of the patient's history were reviewed and updated as appropriate: allergies, current medications, past family history, past medical history, past social history, past surgical history, and problem list.  Review of Systems Pertinent items are noted in HPI.   Objective:   Blood pressure 119/73, pulse 60, height 5' 5 (1.651 m), weight 284 lb (128.8 kg), last menstrual period 07/21/2023, currently breastfeeding. Body mass index is 47.26 kg/m. Well nourished, well hydrated Black female, no apparent distress She is ambulating and conversing normally. BMI 47 Abd- benign Wound vac removed. No evidence of cellulitis Incision healing well. There is 2 cm area of granulation tissue at the left edge of the incision. I treated this area with silver nitrate. I took a picture of the incision on her phone for her.   Assessment:   1. Post-operative state      Plan:   1. Post-operative state (Primary) - doing well Come back in 1 week/prn sooner

## 2024-05-10 ENCOUNTER — Telehealth: Admitting: Certified Nurse Midwife

## 2024-05-10 ENCOUNTER — Telehealth (INDEPENDENT_AMBULATORY_CARE_PROVIDER_SITE_OTHER): Admitting: Certified Nurse Midwife

## 2024-05-10 DIAGNOSIS — Z1331 Encounter for screening for depression: Secondary | ICD-10-CM

## 2024-05-10 DIAGNOSIS — Z98891 History of uterine scar from previous surgery: Secondary | ICD-10-CM | POA: Insufficient documentation

## 2024-05-10 NOTE — Progress Notes (Signed)
 Virtual Visit via Video Note  I connected with Melanie [name] on 05/10/24 at  7:35 AM EDT by a video enabled telemedicine application and verified that I am speaking with the correct person using two identifiers.  Location: Patient: at home Provider: at the office   I discussed the limitations of evaluation and management by telemedicine and the availability of in person appointments. The patient expressed understanding and agreed to proceed.  History of Present Illness: Pt is 2 wk status post primary c/section for failure to progress and NRFHT   Observations/Objective: She is doing well. States she is mostly bottle feeding and that is going well. Her bleeding is minimal and her pain is mostly controlled with tylenol  and ibuprofen . She occasionally uses the percocet.  She states over all her mood is good but she has her moments. She is off of work until January .   Assessment and Plan:  She denies having pp depression and denies need of medication   Follow Up Instructions:    I discussed the assessment and treatment plan with the patient. The patient was provided an opportunity to ask questions and all were answered. The patient agreed with the plan and demonstrated an understanding of the instructions.   The patient was advised to call back or seek an in-person evaluation if the symptoms worsen or if the condition fails to improve as anticipated.  I provided 10 minutes of non-face-to-face time during this encounter.   Zelda Hummer, CNM

## 2024-05-11 ENCOUNTER — Ambulatory Visit: Admitting: Obstetrics & Gynecology

## 2024-05-12 ENCOUNTER — Ambulatory Visit: Admitting: Obstetrics

## 2024-05-12 ENCOUNTER — Encounter: Payer: Self-pay | Admitting: Obstetrics

## 2024-05-12 NOTE — Progress Notes (Signed)
.     Postoperative Cesarean Incision Check Melanie [name] is a 38 y.o. H5E7977 s/p primary LTCS with Dr. Starla at [redacted]w[redacted]d for non reassuring heart tones, POD#17, here today for incision check.  Subjective: Pain is controlled with acetaminophen , gabapentin , toradol  and oxycodone .  She denies fever, chills, nausea, and vomiting. Eating a regular diet with difficulty.  Is having regular bowel movements. Activity: normal activities of daily living. Bleeding is light. She has issues with her incision - small area of red, raw tissue.  Saw Dr. Starla 1wk ago and was told to follow up now. No exudate or bleeding from incision.  Objective: BP 111/61   Pulse 71   Wt 277 lb 4.8 oz (125.8 kg)   Breastfeeding Yes   BMI 46.15 kg/m  Body mass index is 46.15 kg/m.  General:  alert and no distress  Abdomen: soft, bowel sounds active, non-tender  Incision:   healing well, no drainage, no hernia, no seroma, middle of incision not well approximated, raised, erythematous with small amount of granulation tissue, no dehiscence    Assessment/Plan: Melanie [name] is a 38 y.o. H5E7977 s/p primary LTCS  at [redacted]w[redacted]d for non reassuring fetal heart tones, POD#17, here today for incision check, healing well. Her middle incision has sealed with a raised edge that is mildly irritated, but overall ok.  -Discussed at-home care, healing expectations, si/sx of infection.  -Call clinic if developing worsening redness, discharge, or increasing pain. -Avoid vigorous scrubbing/washing of incision site; hygiene reviewed. -May trim back steri-strips if they peel; should fully remove after 1 week from today. -May resume driving and light walking. Still no heavy lifting >10-12 lbs and pelvic rest advised until cleared at 6wk postpartum visit.  -If stooling regularly x 1-2 weeks, can take stool softener every other day x 1 week, taper as tolerated.  Return in about 4 weeks (around 06/09/2024) for 6 week  postpartum.   Estil Mangle, DO [facility]

## 2024-05-12 NOTE — Patient Instructions (Signed)
 Postpartum Care After Cesarean Delivery The following information offers guidance on how to care for yourself from the time you deliver your baby to 6-12 weeks after delivery (postpartum period). Your health care provider may also give you more specific instructions. If you have problems or questions, contact your health care provider. How to care for yourself Perineal care     If your C-section (Cesarean section) was unplanned, and you were allowed to labor and push before delivery, you may have pain, swelling, and discomfort in the tissue between your vaginal opening and your anus (perineum). You may also have an incision in the tissue (episiotomy) or the tissue may have torn during delivery. Follow these instructions as told by your health care provider: Keep your perineum clean and dry. Use medicated pads and pain-relieving sprays and creams as directed. If you have an episiotomy or vaginal tear, check the area every day for signs of infection. Check for: Redness, swelling, or pain. Fluid or blood. Warmth. Pus or a bad smell. You may use a squirt bottle instead of wiping to clean the perineum area after you go to the bathroom. As you start healing, use the squirt bottle before wiping yourself. Make sure to wipe gently. To relieve pain caused by an episiotomy, vaginal tear, or hemorrhoids, try taking a warm sitz bath 2-3 times a day. Use a portable sitz bath that you can put over the toilet. Make sure the water covers your buttocks and perineum when you sit on the seat. Vaginal bleeding It is normal to have vaginal bleeding (lochia) after delivery. Wear a sanitary pad to absorb vaginal bleeding and discharge. During the first week after delivery, the amount and appearance of lochia is often similar to a menstrual period. Over the next few weeks, it will slowly decrease to a dry, yellow-brown discharge. For most women, lochia stops completely by 4-6 weeks after delivery. Vaginal bleeding can  vary from woman to woman. Change your sanitary pads frequently. Watch for any changes in your flow, such as: An increase in bleeding. A change in color. Large blood clots. If you pass a blood clot the size of an egg or larger, contact your health care provider. Do not use tampons or douches until your health care provider says it is safe. If you are not breastfeeding, your period should return 6-8 weeks after delivery. If you are breastfeeding, the time when your period returns varies based on whether or not you are breastfeeding exclusively. Breast care Within the first few days after delivery, your breasts may feel heavy, full, and uncomfortable (breast engorgement). You may also have milk leaking from your breasts. Your health care provider can suggest ways to help relieve breast discomfort. Breast engorgement should go away within a few days. If you are breastfeeding: Wear a bra that supports your breasts and fits you well. Keep your nipples clean and dry. Apply creams and ointments as told. You may need to use breast pads to absorb milk leakage. You may have uterine contractions every time you breastfeed for several weeks after delivery. Uterine contractions help your uterus return to its normal size. If you have any problems with breastfeeding, work with your health care provider or a Advertising copywriter. Take over-the-counter medicines as told by your health care provider to help with pain or discomfort. If you are not breastfeeding: Wear a well-fitting bra and use cold packs to help with swelling. Do not squeeze out (express) milk. This causes you to make more milk. Intimacy and  sexuality Ask your health care provider when you can engage in sexual activity. You are able to get pregnant after delivery, even if you have not had your period. If desired, talk with your health care provider about methods of family planning or birth control (contraception). Follow these instructions at  home: Medicines Take over-the-counter and prescription medicines only as told by your health care provider. If you were prescribed an antibiotic medicine, take it as told by your health care provider. Do not stop taking the antibiotic even if you start to feel better. Take your prenatal vitamins until your postpartum checkup or until your health care provider tells you it is okay to stop taking them. Activity Return to your normal activities as told by your health care provider. Ask your health care provider what activities are safe for you. You may have to avoid lifting. Ask your health care provider how much you can safely lift. If possible, have someone help you at home until you are able to do your usual activities yourself. Try to rest or take naps while your baby is sleeping. General instructions Drink enough fluid to keep your urine pale yellow. Do not drink alcohol, especially if you are breastfeeding. Do not use any products that contain nicotine or tobacco. These products include cigarettes, chewing tobacco, and vaping devices, such as e-cigarettes. If you need help quitting, ask your health care provider. Keep all follow-up visits. Your health care provider will check your healing after delivery and also check your blood pressure. Contact a health care provider if: You have: A fever. Breasts that are painful, hard, or turn red. Stopped breastfeeding and you have not had a menstrual period for 12 weeks after you stopped breastfeeding. Not breastfed at all and you have not had a menstrual period for 12 weeks after delivery. Trouble holding urine or keeping urine from leaking. A bad-smelling vaginal discharge. You have bleeding that soaks through one pad an hour or you have blood clots the size of an egg or larger. You have questions about caring for yourself or your baby. You feel unable to cope with the changes that a new baby brings to your life, and these feelings do not go away.  These include: Feeling unusually sad or worried. Having little or no interest in activities you used to enjoy. Get help right away if: You have: Chest pain or difficulty breathing. Pain, redness or swelling in an arm or leg. Severe pain or cramping in your abdomen. Thoughts about hurting yourself or your baby. You faint or have a seizure. You have any of the following symptoms and you were unable to reach your health care provider: A fever or other signs of infection. Bleeding that is soaking through one pad an hour or you have blood clots the size of an egg or larger. A severe headache that does not go away or you have a headache with vision changes. These symptoms may be an emergency. Get help right away. Call 911. Do not wait to see if the symptoms will go away. Do not drive yourself to the hospital. Get help right away if you feel like you may hurt yourself or others, or have thoughts about taking your own life. Go to your nearest emergency room or: Call 911. Call the National Suicide Prevention Lifeline at 438-797-6303 or 988. This is open 24 hours a day. Text the Crisis Text Line at 878 625 7513. This information is not intended to replace advice given to you by your health care  provider. Make sure you discuss any questions you have with your health care provider. Document Revised: 05/01/2022 Document Reviewed: 05/01/2021 Elsevier Patient Education  2024 [employer].

## 2024-06-07 ENCOUNTER — Ambulatory Visit: Admitting: Family Medicine

## 2024-06-07 DIAGNOSIS — Z1331 Encounter for screening for depression: Secondary | ICD-10-CM | POA: Diagnosis not present

## 2024-06-07 DIAGNOSIS — Z98891 History of uterine scar from previous surgery: Secondary | ICD-10-CM

## 2024-06-07 DIAGNOSIS — D509 Iron deficiency anemia, unspecified: Secondary | ICD-10-CM | POA: Diagnosis not present

## 2024-06-07 DIAGNOSIS — R6339 Other feeding difficulties: Secondary | ICD-10-CM

## 2024-06-07 DIAGNOSIS — F53 Postpartum depression: Secondary | ICD-10-CM

## 2024-06-07 HISTORY — DX: Postpartum depression: F53.0

## 2024-06-07 LAB — HEMOGLOBIN, FINGERSTICK: Hemoglobin: 10.5 g/dL — ABNORMAL LOW (ref 11.1–15.9)

## 2024-06-07 NOTE — Assessment & Plan Note (Addendum)
 CMHRP risk screens completed and no concerns identified aside from positive EDPS. See that A&P for more. Some breastfeeding concerns, but patient is well established with lactation consultants through pediatric clinic.

## 2024-06-07 NOTE — Progress Notes (Addendum)
 Patient here for PP appointment. Patient unsure about birth control. Still having pain in C-section area.SABRASABRAIzetta Parish, RN

## 2024-06-07 NOTE — Assessment & Plan Note (Addendum)
 EDPS score 13 at postpartum exam. Offered referral to counseling, patient politely declines. No SI/HI/self-harm thoughts. Provided resources in AVS, including maternal mental health hotline and [facility] clinic here in [place]. Encouraged her to call if needed.

## 2024-06-07 NOTE — Patient Instructions (Addendum)
 Please visit WIC (women's, infants, and children program) to see about their breast feeding peer support program. You can start this program to get tips and tricks for successful breast feeding of your baby.   Resource regarding exposures that could affect pregnancy and breast feeding Mother To [facility] is a dentist with lots of information on the effects of many medications and exposures on your pregnancy. It is free to use, including their web site, phone line, text service, app, or email and live chat.  http://golden-thomas.org/ Email or live chat: MotherToBaby.org Phone: 4342555759 Text: 251-288-4920 App: search LactRx   Maternal Mental Health If you start to develop the below symptoms of depression, please reach out to us  for an appointment. There is also a Biomedical Scientist Health Hotline at [phone]). This hotline has trained counselors, doulas, and midwifes to real-time support, information, and resources.  Feeling sad or hopeless most of the time Lack of interest in things you used to enjoy Less interest in caring for yourself (dressing, fixing hair) Trouble concentrating Trouble coping with daily tasks Constant worry about your baby Sleeping or eating too much or too little Feeling very anxious or nervous Unexplained irritability or anger Unwanted or scary thoughts Feeling that you are not a good mother Thoughts of hurting yourself or your baby  If you feel you are experiencing a mental health crisis, please reach out to the National Suicide Prevention Hotline at 1-800-273-TALK 364-350-2617).    Therapy You can use the following website to start looking for a therapist. Remember, finding a therapist you click with it a lot like speed dating - you have to sort through a bunch of lemons before finding what you like!  Alan Hail, LCSW Licensed Clinical Social Worker at [facility] Department  Phone:  [phone] [address], [place] [address]  www.[facility].com Access to care line (24/7) 587-695-5673 Treatment information  Crisis assistance  Mobile crisis team connection Mental health needs  Substance use disorder  Intellectual and developmental disabilities  [facility] For children and teens who have experienced the death of a loved one Phone: [phone] [address], [place] [address] [email or web address]  [email or web address]://www.psychologytoday.com/us  Search for [place], [place] (or city of your choice).  On the next page, you will see filter options at the top.  You may filter therapists by insurance they take, issues you would like to work on, or therapy types.   [email or web address]

## 2024-06-07 NOTE — Progress Notes (Deleted)
   OBSTETRICS POSTPARTUM CLINIC PROGRESS NOTE  Subjective:     Melanie [name] is a 38 y.o. [phone] female who presents for a postpartum visit. She is 6 weeks postpartum following a low cervical transverse Cesarean section. I have reviewed the prenatal and intrapartum course. The delivery was at [redacted]w[redacted]d gestational weeks.  Anesthesia: spinal. Postpartum course has been ***. Baby's course has been ***. Baby is feeding by {breast/bottle:69}. Bleeding: patient {HAS HAS WNU:81165} not resumed menses, with No LMP recorded.. Bowel function is {normal:32111}. Bladder function is {normal:32111}. Patient {is/is not:9024} sexually active. Contraception method desired is {contraceptive method:5051}. Postpartum depression screening: {neg default:13464::negative}.  EDPS score is ***.    The following portions of the patient's history were reviewed and updated as appropriate: allergies, current medications, past family history, past medical history, past social history, past surgical history, and problem list.  Review of Systems {ros; complete:30496}   Objective:    There were no vitals taken for this visit.  General:  alert and no distress   Breasts:  inspection negative, no nipple discharge or bleeding, no masses or nodularity palpable  Lungs: clear to auscultation bilaterally  Heart:  regular rate and rhythm, S1, S2 normal, no murmur, click, rub or gallop  Abdomen: soft, non-tender; bowel sounds normal; no masses,  no organomegaly.  ***Well healed Pfannenstiel incision   Vulva:  normal  Vagina: normal vagina, no discharge, exudate, lesion, or erythema  Cervix:  no cervical motion tenderness and no lesions  Corpus: normal size, contour, position, consistency, mobility, non-tender  Adnexa:  normal adnexa and no mass, fullness, tenderness  Rectal Exam: Not performed.         Labs:  Lab Results  Component Value Date   HGB 9.9 (L) 04/26/2024     Assessment:   No diagnosis found.    Plan:    1. Contraception: {method:5051} 2. Will check Hgb for h/o postpartum anemia of less than 10.  3. Follow up in: {1-10:13787} {time; units:19136} or as needed.    Vicci Rollo BRAVO, RN [facility]

## 2024-06-07 NOTE — Progress Notes (Signed)
 [facility] [address], [address] [place] [address] Main phone: [phone]  Postpartum Visit  Subjective:  Melanie [name] is a 38 y.o. [phone] female who presents for a postpartum visit. She is 6 weeks postpartum following a primary cesarean section.  I have fully reviewed the prenatal and intrapartum course. Postpartum course has been medically uncomplicated.   Patient Active Problem List   Diagnosis Date Noted   Positive depression screening 06/07/2024   Breast feeding problem in infant 06/07/2024   History of cesarean section, low transverse 05/10/2024   Pelvic pain 04/22/2024   Iron  deficiency anemia 04/18/2024   Abnormal Pap smear of cervix 01/04/2024   Obesity 01/11/2015   Delivery The delivery was at [redacted]w[redacted]d gestational weeks.  Anesthesia: epidural.  Delivery complications: Elective induction turned to pLTCS for non-reassuring FHR. Patient describes her labor and delivery as mixed.   Infant Baby is doing well.  Baby is feeding by both breast and bottle - Total Comfort.  GI & GU Patient had no laceration at delivery.  Bleeding: no bleeding.  Bowel function is normal.  Bladder function is normal.  Patient has urinary incontinence? No.   Sexuality and Contraception Patient is not sexually active.  Current contraception method is abstinence.  Desired contraception method is abstinence.   The pregnancy intention screening data noted above was reviewed. Potential methods of contraception were discussed. The patient elected to proceed with No data recorded.  Mood Postpartum depression screening: positive.   Edinburgh Postnatal Depression Scale - 06/07/24 1028       Edinburgh Postnatal Depression Scale:  In the Past 7 Days   I have been able to laugh and see the funny side of things. 0    I have looked forward with enjoyment to things. 0    I have blamed myself unnecessarily when things  went wrong. 2    I have been anxious or worried for no good reason. 0    I have felt scared or panicky for no good reason. 2    Things have been getting on top of me. 1    I have been so unhappy that I have had difficulty sleeping. 2    I have felt sad or miserable. 3    I have been so unhappy that I have been crying. 3    The thought of harming myself has occurred to me. 0    Edinburgh Postnatal Depression Scale Total 13         Health Maintenance Health Maintenance Due  Topic Date Due   Hepatitis B Vaccines 19-59 Average Risk (1 of 3 - 19+ 3-dose series) Never done   HPV VACCINES (1 - 3-dose SCDM series) Never done   Influenza Vaccine  03/04/2024   COVID-19 Vaccine (1 - 2025-26 season) Never done   The following portions of the patient's history were reviewed and updated as appropriate: current medications, past medical history, past surgical history, and problem list.  Review of Systems Pertinent items noted in HPI and remainder of comprehensive ROS otherwise negative.  Objective:  BP 120/77 (BP Location: Right Arm, Patient Position: Sitting, Cuff Size: Large)   Pulse 60   Ht 5' 5 (1.651 m)   LMP 07/21/2023 (Exact Date)   Breastfeeding Yes   BMI 46.15 kg/m    General:  alert, cooperative, appears stated age, and no distress   Breasts:  Overall normal, small healing crack at 12 o'clock of right nipple base  Lungs: clear to auscultation bilaterally  Heart:  regular rate and rhythm, S1, S2 normal, no murmur, click, rub or gallop  Abdomen: soft, non-tender; bowel sounds normal; no masses,  no organomegaly   Wound well approximated incision  GU exam:  not indicated       Assessment and Plan:   Postpartum care and examination Assessment & Plan: CMHRP risk screens completed and no concerns identified aside from positive EDPS. See that A&P for more. Some breastfeeding concerns, but patient is well established with lactation consultants through pediatric clinic.    Iron   deficiency anemia, unspecified iron  deficiency anemia type Assessment & Plan: Hgb today improved to 10.5. Discussed following with primary care provider. Recommended iron -rich foods in diet and discussed it is safe to continue PO iron  every other day while breastfeeding.   Orders: -     Hemoglobin, fingerstick  Positive depression screening Assessment & Plan: EDPS score 13 at postpartum exam. Offered referral to counseling, patient politely declines. No SI/HI/self-harm thoughts. Provided resources in AVS, including maternal mental health hotline and [facility] clinic here in [place]. Encouraged her to call if needed.    Breast feeding problem in infant Assessment & Plan: Patient reports breast feeding issues throughout the last 6 weeks, including nipple pain and cracked nipples. Well-established with lactation consultant through baby's pediatric clinic. Provided samples of nipple cream. Offered referral to different lactation clinic or walk-in services through [facility] breast feeding peer support program; patient politely declines, stating she is well supported by current advertising copywriter.    History of cesarean section, low transverse Assessment & Plan: Incision well healing on exam. Patient reports lower abdominal pain and pelvic pain behind incision that feels like pulling or tugging. Discussed possibility of scar tissue from pLTCS. Already evaluated by surgeon, who has no concerns. Offered referral to pelvic floor PT, but patient politely declines, stating she would think about it and let us  know.    Essential components of care per ACOG recommendations:  1.  Mood and well being: Patient with positive depression screening today. Reviewed local resources for support.  - Patient tobacco use? No.   - hx of drug use? No.    2. Infant care and feeding:  -Patient currently breastmilk feeding? Yes. Reviewed importance of draining breast regularly to support lactation.  -Social determinants  of health (SDOH) reviewed in EPIC. No concerns  3. Sexuality, contraception and birth spacing - Patient does not want a pregnancy in the next year.  Desired family size is 2 children.  - Reviewed reproductive life planning. Reviewed options based on patient desire and reproductive life plan. Patient is interested in Female Condom. This was provided to the patient today.   Risks, benefits, and typical effectiveness rates were reviewed.  Questions were answered.  Written information was also given to the patient to review.    The patient will follow up in  1 years for surveillance.  The patient was told to call with any further questions, or with any concerns about this method of contraception.  Emphasized use of condoms 100% of the time for STI prevention.  Emergency Contraception Precautions (ECP): Patient assessed for need of ECP. She is not a candidate based on no intercourse since delivery.  - Discussed birth spacing of 18 months  4. Sleep and fatigue -Encouraged family/partner/community support of 4 hrs of uninterrupted sleep to help with mood and fatigue  5. Physical Recovery  - Discussed patients delivery and complications. She describes her labor as mixed. - Perineal  healing reviewed. Patient expressed understanding - Patient is safe to resume physical and sexual activity  6.  Health Maintenance - HM due items addressed No - none due - Last pap smear:  No results found for: DIAGPAP, HPVHIGH, ADEQPAP Lab Results  Component Value Date   SPECADGYN Comment 10/15/2023   Result Date Procedure Results Follow-ups  10/15/2023 IGP, Aptima HPV DIAGNOSIS:: Comment Specimen adequacy:: Comment Clinician Provided ICD10: Comment Performed by:: Comment Electronically signed by:: Comment PAP Smear Comment: . Note:: Comment Test Methodology: Comment HPV Aptima: Positive (A) Pap in 12 months: due 10/14/2024  11/23/2014 HM PAP SMEAR HM Pap smear: Negative    - Pap smear not done at  today's visit.  - Breast Cancer screening indicated? No.   7. Chronic Disease/Pregnancy Condition follow up: Anemia - PCP follow up  Betsey CHRISTELLA Helling, MD [facility] Department

## 2024-06-08 ENCOUNTER — Ambulatory Visit: Admitting: Obstetrics

## 2024-06-08 ENCOUNTER — Telehealth: Payer: Self-pay | Admitting: Family Medicine

## 2024-06-08 DIAGNOSIS — Z124 Encounter for screening for malignant neoplasm of cervix: Secondary | ICD-10-CM

## 2024-06-08 DIAGNOSIS — Z130 Encounter for screening for diseases of the blood and blood-forming organs and certain disorders involving the immune mechanism: Secondary | ICD-10-CM

## 2024-06-08 NOTE — Telephone Encounter (Signed)
 Call to client who desires return to work note for 08/10/24. Per client, did not have to go for her [facility] appt yesterday as had post-partum appt at [facility] 06/07/24. Per client, big boss said she only needed a work note when able to return to work. Client states note in My Chart will be good. Dr. Macario notified of above. Burnadette Lowers, RN

## 2024-06-10 ENCOUNTER — Telehealth: Payer: Self-pay | Admitting: Family Medicine

## 2024-06-13 ENCOUNTER — Ambulatory Visit: Payer: Self-pay

## 2024-06-15 ENCOUNTER — Telehealth: Payer: Self-pay | Admitting: Family Medicine

## 2024-06-16 ENCOUNTER — Encounter: Payer: Self-pay | Admitting: Family Medicine

## 2024-06-16 NOTE — Assessment & Plan Note (Signed)
 Incision well healing on exam. Patient reports lower abdominal pain and pelvic pain behind incision that feels like pulling or tugging. Discussed possibility of scar tissue from pLTCS. Already evaluated by surgeon, who has no concerns. Offered referral to pelvic floor PT, but patient politely declines, stating she would think about it and let us  know.

## 2024-06-16 NOTE — Assessment & Plan Note (Addendum)
 Patient reports breast feeding issues throughout the last 6 weeks, including nipple pain and cracked nipples. Well-established with lactation consultant through baby's pediatric clinic. Provided samples of nipple cream. Offered referral to different lactation clinic or walk-in services through [facility] breast feeding peer support program; patient politely declines, stating she is well supported by current advertising copywriter.

## 2024-06-16 NOTE — Assessment & Plan Note (Signed)
 Hgb today improved to 10.5. Discussed following with primary care provider. Recommended iron -rich foods in diet and discussed it is safe to continue PO iron  every other day while breastfeeding.

## 2024-06-20 ENCOUNTER — Encounter: Payer: Self-pay | Admitting: Family Medicine

## 2024-06-20 ENCOUNTER — Telehealth: Payer: Self-pay | Admitting: Family Medicine

## 2024-06-23 ENCOUNTER — Telehealth: Payer: Self-pay | Admitting: Family Medicine

## 2024-06-23 ENCOUNTER — Ambulatory Visit (LOCAL_COMMUNITY_HEALTH_CENTER): Admitting: Family Medicine

## 2024-06-23 ENCOUNTER — Encounter: Payer: Self-pay | Admitting: Family Medicine

## 2024-06-23 VITALS — BP 125/78 | HR 68 | Ht 61.0 in | Wt 284.4 lb

## 2024-06-23 DIAGNOSIS — Z1331 Encounter for screening for depression: Secondary | ICD-10-CM

## 2024-06-23 MED ORDER — QUETIAPINE FUMARATE 25 MG PO TABS: 12.5000 mg | ORAL_TABLET | Freq: Every day | ORAL | 2 refills | Status: DC

## 2024-06-23 NOTE — Telephone Encounter (Signed)
 Called [place] [facility] to consult psychiatry about patient with postpartum depression symptoms but positive MDQ screen.   Discussed case with Dr. Gwynneth Lot. We discussed low-dose seroquel and virtual psych assessment while waiting to establish with [facility] perinatal psych.   After talking over our options with Ms. Loyola, she indicates she would like both low-dose seroquel and the virtual psych assessment.   Plan:  - seroquel 12.5 mg QHS, may increase to 25 mg if tolerated and needed for symptoms (Dr. Lot said ok for PRN use of Q H S seroquel) - virtual assessment ([place] [facility] to call patient directly for scheduling) - NP Verneta Bers to send referral for [facility] perinatal mood disorders clinic  Dorothyann Helling, MD 06/23/24  3:27 PM

## 2024-06-23 NOTE — Progress Notes (Signed)
  [facility] [address] [place] [address] Main phone: [phone]  Women's Health Problem Visit   Subjective:  Denene Alamillo Cape is a 38 y.o. being seen today for postpartum depression concerns .   Chief Complaint  Patient presents with   Depression    Depression        Associated symptoms include insomnia.   Dawana recently had a postpartum visit with us  at the beginning of Nov. Called clinic earlier this week requesting to be seen for worsening depression. At the time of pp visit, EDPS was 5, denied SI/HI but declined referrals to counseling and services.  Health Maintenance Due  Topic Date Due   Hepatitis B Vaccines 19-59 Average Risk (1 of 3 - 19+ 3-dose series) Never done   HPV VACCINES (1 - 3-dose SCDM series) Never done   Influenza Vaccine  03/04/2024   COVID-19 Vaccine (1 - 2025-26 season) Never done    Review of Systems  Psychiatric/Behavioral:  Positive for depression. The patient is nervous/anxious and has insomnia.     The following portions of the patient's history were reviewed and updated as appropriate: allergies, current medications, past family history, past medical history, past social history, past surgical history and problem list. Problem list updated.  See flowsheet for other program required questions.  Objective:   Vitals:   06/23/24 1359  BP: 125/78  Pulse: 68  Weight: 284 lb 6.4 oz (129 kg)  Height: 5' 1 (1.549 m)    Physical Exam Vitals and nursing note reviewed.  Psychiatric:        Mood and Affect: Affect is tearful.        Speech: Speech normal.        Behavior: Behavior normal.     Assessment and Plan:  Larosa Rhines Flaim is a 38 y.o. female presenting to the [facility] Department for a Women's Health problem visit  1. Positive depression screening (Primary) -denies SI/HI -has 38 yo at home and 9 week old  baby -sometimes has support from FOB, reports she feels much better when she can sleep a little more at a time -also reports mom is a good support for her -reviewed some self care tips for boosting mood -consulted Dr. Macario, who contacted [place] [facility]- recommended to start on seroquel  -per MDQ- positive screening for BPD -reviewed with patient that close follow up recommended- agrees to referral to [facility] perinatal psychiatry. RN to fax form -reviewed options for support including support groups through [facility] and individual counseling with Alan HUGHS - given handout for [facility] and referral to [facility] via EPIC -planning to contact Dr. Macario in 1-2 weeks for mood follow up   - QUEtiapine (SEROQUEL) 25 MG tablet; Take 0.5 tablets (12.5 mg total) by mouth at bedtime. Can increase to 1 tablet (25 mg total) if needed for symptoms  Dispense: 30 tablet; Refill: 2   No follow-ups on file.  No future appointments.  Verneta Bers, [place]

## 2024-06-28 ENCOUNTER — Encounter: Payer: Self-pay | Admitting: Family Medicine

## 2024-06-28 ENCOUNTER — Telehealth: Payer: Self-pay | Admitting: Family Medicine

## 2024-06-28 DIAGNOSIS — F53 Postpartum depression: Secondary | ICD-10-CM

## 2024-06-28 NOTE — Telephone Encounter (Addendum)
 Called patient to follow up on:   Tolerance of Seroquel  12.5 - 25 mg at bedtime, started last week on 06/23/24 If she has connected with [facility] to schedule a one-time telehealth assessment  If she has heard from [facility] perinatal mood disorders program s/p referral 11/20  Cell # - no answer, left HIPAA safe VM Home # - no answer, left HIPAA safe VM  I will try to reach her again before the holiday.   Dorothyann Helling, MD 06/28/24  3:06 PM  ADDENDUM:  Ms. Inge returned the call and talked about how she is feeling. Reports doing well on the medication. No obvious adverse side effects. Has already completed her virtual telehealth session with [place] [facility]. She said that psychiatrist would be reaching out soon with medication recommendations. And is connected with [facility] perinatal mood disorders clinic.   Encouraged her to call as needed.   Dorothyann Helling, MD

## 2024-07-07 ENCOUNTER — Telehealth: Payer: Self-pay | Admitting: Family Medicine

## 2024-07-07 NOTE — Telephone Encounter (Signed)
 Staff message sent on [facility] to [employer], PMHNP. She had a telehealth assessment with our shared patient on 06/27/24. I do not see a completed note or recommendations. I have reached out to her for these so I may prescribe the recommended medications if appropriate.   Dorothyann Helling, MD 07/07/24  2:42 PM

## 2024-07-19 ENCOUNTER — Telehealth: Payer: Self-pay | Admitting: Licensed Clinical Social Worker

## 2024-07-19 ENCOUNTER — Telehealth: Payer: Self-pay | Admitting: Family Medicine

## 2024-07-19 DIAGNOSIS — O906 Postpartum mood disturbance: Secondary | ICD-10-CM

## 2024-07-19 MED ORDER — PROPRANOLOL HCL 10 MG PO TABS: 10.0000 mg | ORAL_TABLET | Freq: Three times a day (TID) | ORAL | 2 refills | Status: AC | PRN

## 2024-07-19 MED ORDER — QUETIAPINE FUMARATE 25 MG PO TABS: 12.5000 mg | ORAL_TABLET | Freq: Every evening | ORAL | 2 refills | Status: AC | PRN

## 2024-07-19 MED ORDER — FLUOXETINE HCL 10 MG PO TABS: 10.0000 mg | ORAL_TABLET | Freq: Every morning | ORAL | 2 refills | Status: AC

## 2024-07-19 NOTE — Telephone Encounter (Signed)
-----   Message from Dorothyann Helling, MD sent at 07/19/2024  5:34 PM EST ----- Regarding: BH consent Heads up, this patient is coming in person to [facility] Monday and will sign a BH waiver at that time. She's excited to do virtual counseling. She's such a sweet patient and I am so glad she can get in to see you.   CL

## 2024-07-19 NOTE — Assessment & Plan Note (Signed)
 Called and discussed psychiatry med recommendations with patient. She reports that psychiatry did a great job of explaining the medication recommendations.   She does not yet have an appointment with the [facility] perinatal mood disorders clinic, but says she is expecting a call from them soon.   She comes in to [facility] on Monday to return her breast pump, so she'll stop and sign the behavioral health consent form to do virtual counseling with our LCSW Alan Hail. All questions answered.   Has appointment 08/24/2023 to establish primary care with [facility]. Will have them take over prescription of her medications at that time.

## 2024-07-19 NOTE — Telephone Encounter (Signed)
 Message from [facility] 07/08/2024:   Smolko, Jamie Stein, PMHNP Sent to Macario Dorothyann HERO, MD [facility] Betsey Macario, I appreciate your patience. The note has been submitted with my recommendations. I was not able to directly route it to you but let me know if you have difficultly accessing it. Take care, Jamie

## 2024-07-19 NOTE — Telephone Encounter (Signed)
 Reviewed completed telemedicine visit note from 06/27/2024. Recommendations below. Primary diagnosis seems to be postpartum anxiety. I personally was concerned for bipolar depression given family history and mildly positive MDQ; however this does not appear to be a concern based on the much longer and more in depth visit with psychiatry.   RECOMMENDATIONS : Medication recommendations: Continue quetiapine  12.5 mg qhs PRN.   START fluoxetine  10 mg qam to address anxiety and depressed mood. Discussed that fluoxetine  and its active metabolite (norfluoxetine) are present in breast milk. Adverse events following exposure to SSRIs via breast milk have been reported in some infants [facility] 2009; Orsolini 2015). Adverse events reported following maternal use of fluoxetine  include agitation, irritability, poor feeding, and poor weight gain; crying, sleep disturbance, vomiting, and watery stools have also been reported Suzzane 1999; Gretel 1993). Infants exposed to an SSRI via breast milk should be monitored for irritability and changes in sleep, feeding patterns, and behavior, as well as growth and development (ABM [Sriraman 2015]; BAP [McAllister-Williams 2017]; Nolon 2004).  Discussed possible risk of side effects of fluoxetine  including insomnia, GI, sedation, HA, induction of mania, and increased SI.   Additionally discussed option of propranolol  10 mg PRN for anxiety. May take 1-2 tablet q8 hours as needed for anxiety.   Psychotherapy recommendations: Ms. Navejas would benefit from individual therapy referral she could attend virtually due to parenting responsibilities. Provided postpartum.net resources for virtual support groups.   Psychosocial interventions: Provided sleep hygiene education. Encouraged Ms. Sabado to sleep when her partner is present to care for baby in order to get 3-5 hours of consistent sleep. Encouraged to go for a walk and leave her house at least once per day.    If additional questions regarding management, please contact [facility] [phone] extension 2) to speak with a perinatal psychiatrist/NP and care coordinator between 8 AM-5 PM Monday through Friday.   Postpartum mood disturbance Assessment & Plan: Called and discussed psychiatry med recommendations with patient. She reports that psychiatry did a great job of explaining the medication recommendations.   She does not yet have an appointment with the [facility] perinatal mood disorders clinic, but says she is expecting a call from them soon.   She comes in to [facility] on Monday to return her breast pump, so she'll stop and sign the behavioral health consent form to do virtual counseling with our LCSW Alan Hail. All questions answered.   Has appointment 08/24/2023 to establish primary care with [facility]. Will have them take over prescription of her medications at that time.   Orders: -     QUEtiapine  Fumarate; Take 0.5 tablets (12.5 mg total) by mouth at bedtime as needed. Take as needed for mood, insomnia, and anxiety. Only take at bedtime. Can make you sleepy.  Dispense: 30 tablet; Refill: 2 -     FLUoxetine  HCl; Take 1 tablet (10 mg total) by mouth in the morning. This is a daily medication. Do not skip doses.  Dispense: 30 tablet; Refill: 2 -     Propranolol  HCl; Take 1-2 tablets (10-20 mg total) by mouth every 8 (eight) hours as needed. For anxiety.  Dispense: 30 tablet; Refill: 2   Dorothyann Helling, MD 07/19/2024  5:34 PM

## 2024-07-22 ENCOUNTER — Other Ambulatory Visit (HOSPITAL_COMMUNITY): Payer: Self-pay

## 2024-07-22 ENCOUNTER — Telehealth (HOSPITAL_COMMUNITY): Payer: Self-pay

## 2024-07-22 NOTE — Telephone Encounter (Signed)
 Pharmacy Patient Advocate Encounter  Received notification from [facility] MEDICAID that Prior Authorization for QUEtiapine  Fumarate 25MG  tablets has been APPROVED from 07/21/24 to 08/03/24   PA #/Case ID/Reference #: [id number]

## 2024-08-08 ENCOUNTER — Ambulatory Visit: Admitting: Licensed Clinical Social Worker

## 2024-08-08 DIAGNOSIS — F321 Major depressive disorder, single episode, moderate: Secondary | ICD-10-CM

## 2024-08-08 NOTE — Progress Notes (Signed)
 Counselor Initial Adult Exam  Name: Melanie [name] Date: 08/08/2024 MRN: [id number] DOB: Apr 26, 1986 PCP: Pcp, No  120 total minutes. I spent 60 minutes on real-time audio and video with the patient on the date of service. I spent an additional 60  minutes on pre- and post-visit activities on the date of service including collateral, chart review, team discussion, and documentation.   A biopsychosocial was completed on the Patient. Background information and current concerns were obtained during an intake on [facility] or on Phone with the [facility] Department clinician, Alan Hail, LCSW.  Reviewed professional disclosure, contact information and confidentiality was discussed and appropriate consents were signed.     Reason for Visit /Presenting Problem: Patient was referred for evaluation and treatment of postpartum depression and anxiety. Patient reports she has been experiencing postpartum depressive symptoms since the birth of her son on 04/25/24. She describes the adjustment as really challenging, citing multiple contributing stressors including breastfeeding difficulties, disrupted sleep, being out of work on unpaid maternity leave, financial stress, and ongoing challenges with the father of her son.  Patient reports she is currently not in a relationship with the babys father, though she has known him for many years. Pregnancy was unplanned and described as a surprise. Patient has two children: a newborn son and a 37-year-old daughter. She notes limited family support at this time, sharing that her mother was very supportive during her first postpartum experience but is now primarily caring for the patients father, who became paralyzed four years ago. Although patient reports her mom cannot be as supportive day to day, she still feels supported by her mom.   Patient endorses depressive symptoms including low motivation, stating she will lay in the bed and do  nothing at times, feelings of loneliness when alone, and decreased energy. She reports moments of joy when she sees her childrens faces and states she is bonding with her baby. Patient reports she is able to sleep when the baby is sleeping and notes that her prescribed medications are helping with sleep. Current medications include quetiapine  and propranolol .  Patient reports a history of depressive symptoms in the past but states she never previously sought mental health treatment. She denies current relationship support from the babys father but identifies protective factors including support from her mom and sister, daily communication with a friend and a supportive employer. Patient reports feeling okay about returning to work on Wednesday and states she is looking forward to resuming her job at [employer].  Patient remembers her childhood as great and reports that she was raised by both parents, and has 2 brothers and one sister.      08/08/2024    1:48 PM 09/07/2023    9:02 AM  Depression screen PHQ 2/9  Decreased Interest 3 0  Down, Depressed, Hopeless 3 0  PHQ - 2 Score 6 0  Altered sleeping 2   Tired, decreased energy 2   Change in appetite 1   Feeling bad or failure about yourself  0   Trouble concentrating 0   Moving slowly or fidgety/restless 0   Suicidal thoughts 0   PHQ-9 Score 11       08/08/2024    1:57 PM  GAD 7 : Generalized Anxiety Score  Nervous, Anxious, on Edge 0  Control/stop worrying 2  Worry too much - different things 2  Trouble relaxing 2  Restless 0  Easily annoyed or irritable 2  Afraid - awful might happen 0  Total GAD  7 Score 8  Anxiety Difficulty Somewhat difficult        06/07/2024   10:28 AM 04/26/2024   11:57 AM 04/05/2024   10:21 AM 10/15/2023    9:00 AM  Edinburgh Postnatal Depression Scale Screening Tool  I have been able to laugh and see the funny side of things. 0 0  0  0   I have looked forward with enjoyment to things. 0 0  1  0    I have blamed myself unnecessarily when things went wrong. 2 0  0  1   I have been anxious or worried for no good reason. 0 0  0  0   I have felt scared or panicky for no good reason. 2 0  0  0   Things have been getting on top of me. 1 0  2  0   I have been so unhappy that I have had difficulty sleeping. 2 0  3  1   I have felt sad or miserable. 3 0  3  1   I have been so unhappy that I have been crying. 3 1  2  1    The thought of harming myself has occurred to me. 0 0  0  0   Edinburgh Postnatal Depression Scale Total 13 1 11 4       Data saved with a previous flowsheet row definition    Mental Status Exam:    Appearance:   Disheveled     Behavior:  Appropriate, Sharing, and Motivated  Motor:  Normal  Speech/Language:   Clear and Coherent and Normal Rate  Affect:  Appropriate and Congruent  Mood:  normal  Thought process:  normal  Thought content:    WNL  Sensory/Perceptual disturbances:    WNL  Orientation:  oriented to person, place, time/date, situation, and day of week  Attention:  Good  Concentration:  Good  Memory:  WNL  Fund of knowledge:   Good  Insight:    Fair  Judgment:   Fair  Impulse Control:  Fair   Reported Symptoms:  Depressed mood, anhedonia, low energy, sleep disturbance, anxious thoughts     Risk Assessment: Danger to Self:  No Self-injurious Behavior: No Danger to Others: No Duty to Warn:no Physical Aggression / Violence:No  Access to Firearms a concern: No  Gang Involvement:No  Patient / guardian was educated about steps to take if suicide or homicide risk level increases between visits: yes While future psychiatric events cannot be accurately predicted, the patient does not currently require acute inpatient psychiatric care and does not currently meet [place]  involuntary commitment criteria.  Substance Abuse History: Current substance abuse: No     Past Psychiatric History:   No previous psychological problems have been observed No  family  history of mental health diagnosis reported. Reports her mom has history of trauma.  Outpatient Providers: Currently prescribed by [facility] Mood Disorder via [facility] History of Psych Hospitalization: No  Psychological Testing: NA   Abuse History: Victim of No., NA   Report needed: No. Victim of Neglect:No. Perpetrator of NA  Witness / Exposure to Domestic Violence: No   Protective Services Involvement: No  Witness to [employer] Violence:  No   Family History:  Family History  Problem Relation Age of Onset   Bipolar disorder Mother    Hypertension Father    Diabetes Father    Heart disease Father    Healthy Sister    Depression Brother  Ovarian cancer Maternal Grandmother    Prostate cancer Paternal Grandfather    Healthy Daughter    Healthy Half-Brother     Social History:  Social History   Socioeconomic History   Marital status: Single    Spouse name: Declined as not in relationship with FOB   Number of children: 1   Years of education: 12   Highest education level: High school graduate  Occupational History   Not on file  Tobacco Use   Smoking status: Former    Current packs/day: 0.00    Average packs/day: 0.5 packs/day    Types: Cigarettes    Quit date: 08/05/2023    Years since quitting: 1.0    Passive exposure: Past   Smokeless tobacco: Never   Tobacco comments:    Stopped cigarette use 1//2025 when found out pregnant.     Last vaped in summer of 2024.  Vaping Use   Vaping status: Former   Substances: Nicotine, Flavoring  Substance and Sexual Activity   Alcohol use: Not Currently    Comment: Last use 07/29/2023   Drug use: Not Currently    Types: Marijuana    Comment: Last marijuana use was 08/2023.   Sexual activity: Not Currently    Partners: Male    Birth control/protection: None  Other Topics Concern   Not on file  Social History Narrative   Not on file   Social Drivers of Health   Tobacco Use: Medium Risk (05/12/2024)   Patient History     Smoking Tobacco Use: Former    Smokeless Tobacco Use: Never    Passive Exposure: Past  Physicist, Medical Strain: Medium Risk (10/15/2023)   Overall Financial Resource Strain (CARDIA)    Difficulty of Paying Living Expenses: Somewhat hard  Food Insecurity: No Food Insecurity (04/25/2024)   Epic    Worried About Radiation Protection Practitioner of Food in the Last Year: Never true    Ran Out of Food in the Last Year: Never true  Transportation Needs: No Transportation Needs (04/25/2024)   Epic    Lack of Transportation (Medical): No    Lack of Transportation (Non-Medical): No  Physical Activity: Not on file  Stress: Not on file  Social Connections: Not on file  Depression (PHQ2-9): High Risk (08/08/2024)   Depression (PHQ2-9)    PHQ-2 Score: 11  Alcohol Screen: Not on file  Housing: Low Risk (04/25/2024)   Epic    Unable to Pay for Housing in the Last Year: No    Number of Times Moved in the Last Year: 0    Homeless in the Last Year: No  Utilities: Not At Risk (04/25/2024)   Epic    Threatened with loss of utilities: No  Health Literacy: Not on file   Living situation: the patient lives with her infant son and her daughter   Sexual Orientation:  Straight  Relationship Status: single  Name of spouse / other: NA             If a parent, number of children / ages: new born delivery date 04/25/24 , and 37yo.   Support Systems; mom, sister  Financial Stress:  Yes  due to not working currently, will be going back this week; has been given community resources   Income/Employment/Disability: No income, going back to work has been there for 9 years and likes her job.   Military Service: No   Educational History: Education: high school diploma/GED  Religion/Sprituality/World View:   Christian   Any cultural  differences that may affect / interfere with treatment:  not applicable   Recreation/Hobbies: going out walking around and adventuring   Stressors:Financial difficulties   Other: adjusting to  new baby, dealing with father of the baby     Strengths:  Supportive Relationships, Family, Hopefulness, and Able to Communicate Effectively  Barriers:  None noted at this time.    Legal History: Pending legal issue / charges: The patient has no significant history of legal issues. History of legal issue / charges: No.   Medical History/Surgical History:reviewed Past Medical History:  Diagnosis Date   Absolute anemia 02/22/2015   Anemia    during pregnancy   Back pain affecting pregnancy 04/14/2024   COVID-19 affecting pregnancy in third trimester 03/21/2024   Elevated blood pressure affecting pregnancy, antepartum 11/09/2023   Gall bladder disease 2016   Obesity affecting pregnancy    Dermoid Cyst   Pneumonia    2023 and 2025   Postpartum care and examination 06/07/2024   Postpartum depression 06/07/2024   Uterine size-date discrepancy in third trimester 02/16/2024    Past Surgical History:  Procedure Laterality Date   CESAREAN SECTION N/A 04/25/2024   Procedure: CESAREAN DELIVERY;  Surgeon: Starla Harland BROCKS, MD;  Location: [facility] ORS;  Service: Obstetrics;  Laterality: N/A;   CHOLECYSTECTOMY  2017   OVARIAN CYST REMOVAL  2012   SALPINGECTOMY Left 2024    Medications: Current Outpatient Medications  Medication Sig Dispense Refill   acetaminophen  (TYLENOL ) 500 MG tablet Take 2 tablets (1,000 mg total) by mouth every 6 (six) hours as needed for mild pain (pain score 1-3). (Patient not taking: Reported on 06/07/2024) 30 tablet 0   albuterol  (VENTOLIN  HFA) 108 (90 Base) MCG/ACT inhaler Inhale 2 puffs into the lungs every 4 (four) hours as needed. (Patient not taking: Reported on 06/07/2024) 18 g 0   FLUoxetine  (PROZAC ) 10 MG tablet Take 1 tablet (10 mg total) by mouth in the morning. This is a daily medication. Do not skip doses. 30 tablet 2   Prenatal Vit-Fe Fumarate-FA (PRENATAL PO) Take by mouth.     propranolol  (INDERAL ) 10 MG tablet Take 1-2 tablets (10-20 mg total) by mouth  every 8 (eight) hours as needed. For anxiety. 30 tablet 2   QUEtiapine  (SEROQUEL ) 25 MG tablet Take 0.5 tablets (12.5 mg total) by mouth at bedtime as needed. Take as needed for mood, insomnia, and anxiety. Only take at bedtime. Can make you sleepy. 30 tablet 2   Spacer/Aero-Holding Chambers (AEROCHAMBER MV) inhaler Use as instructed (Patient not taking: Reported on 06/07/2024) 1 each 2   No current facility-administered medications for this visit.   Allergies[1]  Fallan Mccarey Chestang is a 39 y.o. year old female with a reported history of mental health diagnoses of Postpartum Anxiety. Patient currently presents with symptoms of depressed mood and mild anxiety following the delivery of her son on 04/25/24. Patient currently describes both depressive symptoms and anxiety symptoms, including depressed mood, anhedonia, sleep disturbance, low energy, and poor appetite. (PHQ-9 = 11). Patient's anxiety symptoms include difficulties controlling worries, difficulties relaxing, irritability, low energy, and sleep disturbance. (GAD-7=8). Patient reports that these symptoms significantly impact her functioning in multiple life domains.   Due to the above symptoms and patient's reported history, patient is diagnosed with Major Depressive Disorder, single episode, Moderate, Postpartum Onset. Patient's mood and anxiety symptoms should continue to be monitored closely to provide further diagnosis clarification. Continued mental health treatment is needed to address patient's symptoms and monitor her safety and  stability. Patient is recommended for psychiatric medication management evaluation and continued outpatient therapy to further reduce her symptoms and improve her coping strategies.    There is no acute risk for suicide or violence at this time.  While future psychiatric events cannot be accurately predicted, the patient does not require acute inpatient psychiatric care and does not currently meet [facility] involuntary commitment criteria.  Diagnoses:    ICD-10-CM   1. Major depressive disorder, single episode, moderate (HCC), Postpartum onset  F32.1      Plan of Care: Patient's goal of treatment and treatment plan will be developed at follow up visit.   -LCSW provided brief psychoeducation on treatment modalities -LCSW encouraged patient to continue to get outside daily   Future Appointments  Date Time Provider Department Center  08/15/2024  1:30 PM Ellender Palma, LCSW [facility] None   Palma Ellender, LCSW      [1] No Known Allergies

## 2024-08-15 ENCOUNTER — Ambulatory Visit: Admitting: Licensed Clinical Social Worker

## 2024-08-15 DIAGNOSIS — F321 Major depressive disorder, single episode, moderate: Secondary | ICD-10-CM

## 2024-08-15 NOTE — Progress Notes (Signed)
 Counselor/Therapist Progress Note  Patient ID: Melanie [name], MRN: [id number],    Date: 08/15/2024  Time Spent: 74 total minutes. I spent 54 minutes on real-time audio and video with the patient on the date of service. I spent an additional 20 minutes on pre- and post-visit activities on the date of service including collateral, chart review, team discussion, and documentation.   Treatment Type: Individual Therapy  Reported Symptoms: Depressed mood, sadness, anhedonia, ruminating thoughts; Anxiety, anxious thoughts, overwhelm    Mental Status Exam:  Appearance:   Fairly Groomed     Behavior:  Appropriate, Sharing, and Motivated  Motor:  Normal  Speech/Language:   Clear and Coherent and Normal Rate  Affect:  Appropriate, Congruent, Full Range, and Tearful  Mood:  normal  Thought process:  normal  Thought content:    WNL  Sensory/Perceptual disturbances:    WNL  Orientation:  oriented to person, place, time/date, situation, and day of week  Attention:  Good  Concentration:  Good  Memory:  WNL  Fund of knowledge:   Good  Insight:    Fair  Judgment:   Fair  Impulse Control:  Fair   Risk Assessment: Danger to Self:  No Self-injurious Behavior: No Danger to Others: No Duty to Warn:no Physical Aggression / Violence:No  Access to Firearms a concern: No  Gang Involvement:No   Subjective: Patient reports she is doing great and then shares her mood is alright, a little tired from her job, she shares that things have been a little difficult, but will be alright.  She was engaged and cooperative throughout the session and was receptive to feedback and intervention. LCSW and patient reviewed treatment plan. Patient voices agreement with treatment plan.   Interventions: Cognitive Behavioral Therapy and Client Centered  Met with patient for a clinical session to assess current symptoms, psychosocial stressors, and safety. Collaboratively established session agenda and  reviewed findings from the previous session. Introduced an integrated therapeutic framework incorporating CBT, ACT, DBT, Mindfulness, and Somatic-EMDR techniques. Explored treatment goals and collaboratively developed a tailored treatment plan, which the patient voiced agreement with.  Provided a supportive environment for the patient to process grief and interpersonal challenges regarding the father of her child and co-parenting. To increase the patient's window of tolerance during the discussion of distressing themes, LCSW led the patient in a somatic regulation exercise involving holding a hand to the heart and gentle self-soothing rocking. This intervention served to stabilize the nervous system, allowing the patient to remain present and engaged in the therapeutic process without becoming overwhelmed or dissociated. LCSW utilized active listening, validation, and strength-based reflections to support the patients emotional processing. Rapport building continued throughout the session to strengthen the therapeutic alliance.   Diagnosis:   ICD-10-CM   1. Major depressive disorder, single episode, moderate (HCC), Postpartum onset  F32.1       Plan: Patient's goal of treatment is to try to get stress and anxiety under control and get a better understanding of what she is going through, just wants to be better.   Goal: Patient will reduce symptoms of stress and anxiety and increase self-understanding to improve overall emotional functioning.  Measurable Objectives (CBT/Mindfulness): Patient will identify and track 1-3 cognitive distortions per week related to her stress. Patient will engage in guided meditation 3x per week   (Somatic-EMDR/ACT): To increase window of tolerance, patient will utilize somatic self-soothing (e.g., hand-to-heart, rhythmic rocking) at least once daily when experiencing distress, and report an increased ability to name and  stay with difficult emotions without becoming  overwhelmed in 4 out of 5 instances. Patient will learn and practice cognitive difusion skills, as reported in sessions.  Patient will identify 3 core vales that will guided her in her behaviors.   (DBT/Psychoeducation): Patient will demonstrate a better understanding of her experience by identifying 2 personal triggers related to her grief and relationship challenges and implementing one DBT Distress Tolerance skill (e.g., TIPP or Self-Soothe) to maintain emotional regulation during high-stress encounters.   Future Appointments  Date Time Provider Department Center  08/23/2024  2:00 PM Ellender Palma, LCSW [facility] None    Palma Ellender, [place]

## 2024-08-23 ENCOUNTER — Ambulatory Visit: Admitting: Licensed Clinical Social Worker

## 2024-08-23 NOTE — Progress Notes (Unsigned)
 Counselor/Therapist Progress Note  Patient ID: Melanie [name], MRN: [id number],    Date: 08/23/2024  Time Spent: ***   Treatment Type: {CHL AMB THERAPY TYPES:[id number]}  Reported Symptoms: {CHL AMB Reported [phone]}  Mental Status Exam:  Appearance:   {PSY:22683}     Behavior:  {PSY:21022743}  Motor:  {PSY:22302}  Speech/Language:   {PSY:22685}  Affect:  {PSY:22687}  Mood:  {PSY:31886}  Thought process:  {PSY:31888}  Thought content:    {PSY:[id number]}  Sensory/Perceptual disturbances:    {PSY:7078881555}  Orientation:  {PSY:30297}  Attention:  {PSY:22877}  Concentration:  {PSY:204-222-4089}  Memory:  {PSY:606-267-8318}  Fund of knowledge:   {PSY:204-222-4089}  Insight:    {PSY:204-222-4089}  Judgment:   {PSY:204-222-4089}  Impulse Control:  {PSY:204-222-4089}   Risk Assessment: Danger to Self:  No Self-injurious Behavior: No Danger to Others: No Duty to Warn:no Physical Aggression / Violence:No  Access to Firearms a concern: No  Gang Involvement:No   Subjective: Patient reports    Interventions: {PSY:[phone]} LCSW established psychological safety. LCSW met with patient to identify needs related to stressors and functioning, and assess and monitor for signs and symptoms of and , and assess safety. Checked in with patient regarding how they have been doing since the last follow-up session.   LCSW assisted patient in processing their thoughts and emotions regarding ***   LCSW provided psychoeducation and therapeutic intervention regarding coping with ***   Provided support through active listening, validation of feelings, and highlighted patient's strengths.  Engaged patient in processing current psychosocial stressors  LCSW intervened with positive regard and optimism to validate client's emotions, and supported client in exploring ways to increase her intentional use of self care  Used guided discovery to explore patient's perception of  relationship challenges asking patient to identify supportive evidence for thoughts and against thoughts, encouraging a broader perspective.  Reviewed previous session regarding   LCSW assisted, actively listened, taught, shared, role/played, provided  LCSW reviewed behavior modification, distress tolerance and effective communication skills with patient.   Provided supportive space encouraging emotional release and processing of current psychosocial stressors  Diagnosis:No diagnosis found.  Plan: Patient's goal of treatment is to try to get stress and anxiety under control and get a better understanding of what she is going through, just wants to be better.    Goal: Patient will reduce symptoms of stress and anxiety and increase self-understanding to improve overall emotional functioning.  Measurable Objectives (CBT/Mindfulness): Patient will identify and track 1-3 cognitive distortions per week related to her stress. Patient will engage in guided meditation 3x per week   (Somatic-EMDR/ACT): To increase window of tolerance, patient will utilize somatic self-soothing (e.g., hand-to-heart, rhythmic rocking) at least once daily when experiencing distress, and report an increased ability to name and stay with difficult emotions without becoming overwhelmed in 4 out of 5 instances. Patient will learn and practice cognitive difusion skills, as reported in sessions.  Patient will identify 3 core vales that will guided her in her behaviors.   (DBT/Psychoeducation): Patient will demonstrate a better understanding of her experience by identifying 2 personal triggers related to her grief and relationship challenges and implementing one DBT Distress Tolerance skill (e.g., TIPP or Self-Soothe) to maintain emotional regulation during high-stress encounters.  Future Appointments  Date Time Provider Department Center  08/23/2024  2:00 PM Ellender Palma, LCSW [facility] None      Palma Ellender,  [place]

## 2024-08-30 ENCOUNTER — Ambulatory Visit: Admitting: Licensed Clinical Social Worker

## 2024-08-30 DIAGNOSIS — F321 Major depressive disorder, single episode, moderate: Secondary | ICD-10-CM

## 2024-08-30 NOTE — Progress Notes (Signed)
 Counselor/Therapist Progress Note  Patient ID: Melanie [name], MRN: [id number],    Date: 08/30/2024  Time Spent: 52 total minutes. I spent 45 minutes on real-time audio and video with the patient on the date of service. I spent an additional 7 minutes on pre- and post-visit activities on the date of service including collateral, chart review, team discussion, and documentation.   Treatment Type: Individual Therapy  Reported Symptoms: Mild depressive symptoms, anhedonia; Mild anxiety, anxious thoughts   Mental Status Exam:  Appearance:   Casual     Behavior:  Appropriate, Sharing, and Motivated  Motor:  Normal  Speech/Language:   Clear and Coherent and Normal Rate  Affect:  Appropriate, Congruent, and Full Range  Mood:  normal  Thought process:  normal  Thought content:    WNL  Sensory/Perceptual disturbances:    WNL  Orientation:  oriented to person, place, time/date, situation, and day of week  Attention:  Good  Concentration:  Good  Memory:  WNL  Fund of knowledge:   Good  Insight:    Fair  Judgment:   Fair  Impulse Control:  Fair   Risk Assessment: Danger to Self:  No Self-injurious Behavior: No Danger to Others: No Duty to Warn:no Physical Aggression / Violence:No  Access to Firearms a concern: No  Gang Involvement:No   Subjective: Patient reports a generally positive mood, describing it as great and noting that things have been okay overall. She has been adherent to Fluoxetine  10mg  for approximately 1.5 weeks. Patient reports early perceived benefit; while she notes that episodes of sadness come and go, she emphasizes a subjective improvement in her baseline. She was engaged and cooperative throughout the session.   Interventions: Dialectical Behavioral Therapy and Client Centered  LCSW established psychological safety and conducted a clinical assessment of the patients safety, functional status, and current symptomatology, including a screen for  manic/hypomanic symptoms and psychoeducation on mood monitoring. Patient reported adherence to a new medication regimen (1.5 weeks) with early subjective benefit and improved baseline mood, though she noted intermittent sadness and ongoing distress regarding the father of her child. LCSW facilitated therapeutic processing of this interpersonal conflict using active listening, validation, and strengths-based feedback, while coaching the patient on DBT skills (Radical Acceptance and STOP) to enhance emotional regulation. Patient remained engaged throughout the session, reported positive social engagement with a peer, and demonstrated openness to implementing the discussed distress tolerance skills in response to her current stressors.  Diagnosis:   ICD-10-CM   1. Major depressive disorder, single episode, moderate (HCC), Postpartum onset  F32.1      Plan: Patient's goal of treatment is to try to get stress and anxiety under control and get a better understanding of what she is going through, just wants to be better.    Goal: Patient will reduce symptoms of stress and anxiety and increase self-understanding to improve overall emotional functioning.  Measurable Objectives (CBT/Mindfulness): Patient will identify and track 1-3 cognitive distortions per week related to her stress. Patient will engage in guided meditation 3x per week   (Somatic-EMDR/ACT): To increase window of tolerance, patient will utilize somatic self-soothing (e.g., hand-to-heart, rhythmic rocking) at least once daily when experiencing distress, and report an increased ability to name and stay with difficult emotions without becoming overwhelmed in 4 out of 5 instances. Patient will learn and practice cognitive difusion skills, as reported in sessions.  Patient will identify 3 core vales that will guided her in her behaviors.   (DBT/Psychoeducation): Patient will demonstrate a  better understanding of her experience by identifying  2 personal triggers related to her grief and relationship challenges and implementing one DBT Distress Tolerance skill (e.g., TIPP or Self-Soothe) to maintain emotional regulation during high-stress encounters.  Future Appointments  Date Time Provider Department Center  09/13/2024  2:00 PM [place], LCSW [facility] None    [place], [place]

## 2024-09-13 ENCOUNTER — Ambulatory Visit: Admitting: Licensed Clinical Social Worker
